# Patient Record
Sex: Male | Born: 1950 | Race: White | Hispanic: No | Smoking: Never smoker
Health system: Southern US, Community
[De-identification: ages and names within clinical notes are randomized; demographics above are authoritative.]

## PROBLEM LIST (undated history)

## (undated) DIAGNOSIS — F32A Depression, unspecified: Secondary | ICD-10-CM

## (undated) DIAGNOSIS — F329 Major depressive disorder, single episode, unspecified: Secondary | ICD-10-CM

## (undated) DIAGNOSIS — K5790 Diverticulosis of intestine, part unspecified, without perforation or abscess without bleeding: Secondary | ICD-10-CM

## (undated) DIAGNOSIS — IMO0002 Reserved for concepts with insufficient information to code with codable children: Secondary | ICD-10-CM

## (undated) DIAGNOSIS — I493 Ventricular premature depolarization: Secondary | ICD-10-CM

## (undated) DIAGNOSIS — Z9289 Personal history of other medical treatment: Secondary | ICD-10-CM

## (undated) DIAGNOSIS — S2249XA Multiple fractures of ribs, unspecified side, initial encounter for closed fracture: Secondary | ICD-10-CM

## (undated) DIAGNOSIS — K219 Gastro-esophageal reflux disease without esophagitis: Secondary | ICD-10-CM

## (undated) HISTORY — DX: Diverticulosis of intestine, part unspecified, without perforation or abscess without bleeding: K57.90

## (undated) HISTORY — DX: Ventricular premature depolarization: I49.3

## (undated) HISTORY — PX: WISDOM TOOTH EXTRACTION: SHX21

## (undated) HISTORY — DX: Multiple fractures of ribs, unspecified side, initial encounter for closed fracture: S22.49XA

## (undated) HISTORY — DX: Major depressive disorder, single episode, unspecified: F32.9

## (undated) HISTORY — DX: Depression, unspecified: F32.A

## (undated) HISTORY — PX: DUPUYTREN CONTRACTURE RELEASE: SHX1478

## (undated) HISTORY — PX: TONSILLECTOMY: SUR1361

## (undated) HISTORY — DX: Reserved for concepts with insufficient information to code with codable children: IMO0002

## (undated) HISTORY — DX: Personal history of other medical treatment: Z92.89

## (undated) HISTORY — DX: Gastro-esophageal reflux disease without esophagitis: K21.9

---

## 2003-06-26 ENCOUNTER — Encounter (INDEPENDENT_AMBULATORY_CARE_PROVIDER_SITE_OTHER): Payer: Self-pay | Admitting: Specialist

## 2003-06-26 ENCOUNTER — Ambulatory Visit (HOSPITAL_BASED_OUTPATIENT_CLINIC_OR_DEPARTMENT_OTHER): Admission: RE | Admit: 2003-06-26 | Discharge: 2003-06-26 | Payer: Self-pay

## 2004-09-30 ENCOUNTER — Encounter: Admission: RE | Admit: 2004-09-30 | Discharge: 2004-09-30 | Payer: Self-pay | Admitting: Family Medicine

## 2005-05-08 ENCOUNTER — Ambulatory Visit: Payer: Self-pay | Admitting: Family Medicine

## 2005-06-15 ENCOUNTER — Ambulatory Visit: Payer: Self-pay | Admitting: Family Medicine

## 2005-07-01 ENCOUNTER — Ambulatory Visit: Payer: Self-pay | Admitting: Family Medicine

## 2006-08-10 ENCOUNTER — Ambulatory Visit: Payer: Self-pay | Admitting: Professional

## 2006-09-02 ENCOUNTER — Ambulatory Visit: Payer: Self-pay | Admitting: Psychology

## 2006-09-08 ENCOUNTER — Ambulatory Visit: Payer: Self-pay | Admitting: Family Medicine

## 2006-09-09 ENCOUNTER — Ambulatory Visit: Payer: Self-pay | Admitting: Psychology

## 2006-10-13 ENCOUNTER — Ambulatory Visit: Payer: Self-pay | Admitting: Family Medicine

## 2006-11-29 ENCOUNTER — Ambulatory Visit: Payer: Self-pay | Admitting: Psychology

## 2006-12-23 ENCOUNTER — Ambulatory Visit: Payer: Self-pay | Admitting: Psychology

## 2007-01-10 ENCOUNTER — Ambulatory Visit: Payer: Self-pay | Admitting: Psychology

## 2007-01-18 ENCOUNTER — Ambulatory Visit: Payer: Self-pay | Admitting: Family Medicine

## 2007-01-18 LAB — CONVERTED CEMR LAB
ALT: 23 units/L (ref 0–40)
AST: 25 units/L (ref 0–37)
Albumin: 3.9 g/dL (ref 3.5–5.2)
Alkaline Phosphatase: 41 units/L (ref 39–117)
BUN: 11 mg/dL (ref 6–23)
Bilirubin, Direct: 0.2 mg/dL (ref 0.0–0.3)
CO2: 29 meq/L (ref 19–32)
Calcium: 9.3 mg/dL (ref 8.4–10.5)
Chloride: 104 meq/L (ref 96–112)
Creatinine, Ser: 0.9 mg/dL (ref 0.4–1.5)
GFR calc Af Amer: 112 mL/min
GFR calc non Af Amer: 92 mL/min
Glucose, Bld: 94 mg/dL (ref 70–99)
Phosphorus: 2.8 mg/dL (ref 2.3–4.6)
Potassium: 3.9 meq/L (ref 3.5–5.1)
Sodium: 140 meq/L (ref 135–145)
Total Bilirubin: 1.1 mg/dL (ref 0.3–1.2)
Total Protein: 6.1 g/dL (ref 6.0–8.3)

## 2007-01-24 ENCOUNTER — Ambulatory Visit: Payer: Self-pay | Admitting: Psychology

## 2007-01-28 ENCOUNTER — Ambulatory Visit: Payer: Self-pay | Admitting: Gastroenterology

## 2007-02-04 ENCOUNTER — Ambulatory Visit: Payer: Self-pay | Admitting: Psychology

## 2007-02-11 ENCOUNTER — Ambulatory Visit: Payer: Self-pay | Admitting: Psychology

## 2007-02-21 ENCOUNTER — Ambulatory Visit: Payer: Self-pay | Admitting: Psychology

## 2007-02-23 ENCOUNTER — Ambulatory Visit: Payer: Self-pay | Admitting: Psychology

## 2007-03-11 ENCOUNTER — Ambulatory Visit: Payer: Self-pay | Admitting: Psychology

## 2007-03-25 ENCOUNTER — Ambulatory Visit: Payer: Self-pay | Admitting: Psychology

## 2007-04-15 ENCOUNTER — Ambulatory Visit: Payer: Self-pay | Admitting: Psychology

## 2007-04-18 ENCOUNTER — Ambulatory Visit: Payer: Self-pay | Admitting: Psychology

## 2007-04-21 ENCOUNTER — Ambulatory Visit: Payer: Self-pay | Admitting: Psychology

## 2007-05-02 ENCOUNTER — Ambulatory Visit: Payer: Self-pay | Admitting: Psychology

## 2007-05-16 ENCOUNTER — Ambulatory Visit: Payer: Self-pay | Admitting: Psychology

## 2007-05-30 ENCOUNTER — Ambulatory Visit: Payer: Self-pay | Admitting: Psychology

## 2007-06-13 ENCOUNTER — Ambulatory Visit: Payer: Self-pay | Admitting: Psychology

## 2007-09-05 ENCOUNTER — Telehealth: Payer: Self-pay | Admitting: Family Medicine

## 2007-09-05 ENCOUNTER — Telehealth (INDEPENDENT_AMBULATORY_CARE_PROVIDER_SITE_OTHER): Payer: Self-pay | Admitting: *Deleted

## 2008-04-13 ENCOUNTER — Ambulatory Visit: Payer: Self-pay | Admitting: Psychology

## 2008-11-14 ENCOUNTER — Telehealth: Payer: Self-pay | Admitting: Family Medicine

## 2008-11-22 ENCOUNTER — Ambulatory Visit: Payer: Self-pay | Admitting: Psychology

## 2008-11-29 ENCOUNTER — Ambulatory Visit: Payer: Self-pay | Admitting: Psychology

## 2008-12-07 ENCOUNTER — Ambulatory Visit: Payer: Self-pay | Admitting: Gastroenterology

## 2009-03-19 ENCOUNTER — Ambulatory Visit: Payer: Self-pay | Admitting: Family Medicine

## 2009-03-20 LAB — CONVERTED CEMR LAB
ALT: 18 units/L (ref 0–53)
AST: 23 units/L (ref 0–37)
Albumin: 4.3 g/dL (ref 3.5–5.2)
Alkaline Phosphatase: 43 units/L (ref 39–117)
BUN: 11 mg/dL (ref 6–23)
Basophils Absolute: 0 10*3/uL (ref 0.0–0.1)
Basophils Relative: 0.3 % (ref 0.0–3.0)
Bilirubin, Direct: 0.2 mg/dL (ref 0.0–0.3)
CO2: 31 meq/L (ref 19–32)
Calcium: 9.3 mg/dL (ref 8.4–10.5)
Chloride: 110 meq/L (ref 96–112)
Cholesterol: 177 mg/dL (ref 0–200)
Creatinine, Ser: 0.9 mg/dL (ref 0.4–1.5)
Eosinophils Absolute: 0.1 10*3/uL (ref 0.0–0.7)
Eosinophils Relative: 1.1 % (ref 0.0–5.0)
GFR calc non Af Amer: 91.63 mL/min (ref >60)
Glucose, Bld: 89 mg/dL (ref 70–99)
HCT: 46.1 % (ref 39.0–52.0)
HDL: 49 mg/dL (ref >39.00)
Hemoglobin: 16.5 g/dL (ref 13.0–17.0)
LDL Cholesterol: 112 mg/dL — ABNORMAL HIGH (ref 0–99)
Lymphocytes Relative: 23.5 % (ref 12.0–46.0)
Lymphs Abs: 1.5 10*3/uL (ref 0.7–4.0)
MCHC: 35.7 g/dL (ref 30.0–36.0)
MCV: 94.2 fL (ref 78.0–100.0)
Monocytes Absolute: 0.6 10*3/uL (ref 0.1–1.0)
Monocytes Relative: 9.2 % (ref 3.0–12.0)
Neutro Abs: 4 10*3/uL (ref 1.4–7.7)
Neutrophils Relative %: 65.9 % (ref 43.0–77.0)
PSA: 1.76 ng/mL (ref 0.10–4.00)
Platelets: 135 10*3/uL — ABNORMAL LOW (ref 150.0–400.0)
Potassium: 4.2 meq/L (ref 3.5–5.1)
RBC: 4.9 M/uL (ref 4.22–5.81)
RDW: 12.1 % (ref 11.5–14.6)
Sodium: 142 meq/L (ref 135–145)
TSH: 2.84 microintl units/mL (ref 0.35–5.50)
Total Bilirubin: 0.9 mg/dL (ref 0.3–1.2)
Total CHOL/HDL Ratio: 4
Total Protein: 6.9 g/dL (ref 6.0–8.3)
Triglycerides: 82 mg/dL (ref 0.0–149.0)
VLDL: 16.4 mg/dL (ref 0.0–40.0)
WBC: 6.2 10*3/uL (ref 4.5–10.5)

## 2009-07-18 ENCOUNTER — Ambulatory Visit: Payer: Self-pay | Admitting: Family Medicine

## 2009-07-18 DIAGNOSIS — D696 Thrombocytopenia, unspecified: Secondary | ICD-10-CM | POA: Insufficient documentation

## 2009-07-18 LAB — CONVERTED CEMR LAB
Basophils Absolute: 0 10*3/uL (ref 0.0–0.1)
Basophils Relative: 0.1 % (ref 0.0–3.0)
Eosinophils Absolute: 0.1 10*3/uL (ref 0.0–0.7)
Eosinophils Relative: 1.3 % (ref 0.0–5.0)
HCT: 45.8 % (ref 39.0–52.0)
Hemoglobin: 15.4 g/dL (ref 13.0–17.0)
Lymphocytes Relative: 23.7 % (ref 12.0–46.0)
Lymphs Abs: 1.4 10*3/uL (ref 0.7–4.0)
MCHC: 33.6 g/dL (ref 30.0–36.0)
MCV: 95.3 fL (ref 78.0–100.0)
Monocytes Absolute: 0.7 10*3/uL (ref 0.1–1.0)
Monocytes Relative: 10.7 % (ref 3.0–12.0)
Neutro Abs: 3.9 10*3/uL (ref 1.4–7.7)
Neutrophils Relative %: 64.2 % (ref 43.0–77.0)
Platelets: 154 10*3/uL (ref 150.0–400.0)
RBC: 4.81 M/uL (ref 4.22–5.81)
RDW: 11.8 % (ref 11.5–14.6)
WBC: 6.1 10*3/uL (ref 4.5–10.5)

## 2009-08-15 ENCOUNTER — Ambulatory Visit: Payer: Self-pay | Admitting: Gastroenterology

## 2010-01-28 ENCOUNTER — Ambulatory Visit: Payer: Self-pay | Admitting: Family Medicine

## 2010-01-28 DIAGNOSIS — J069 Acute upper respiratory infection, unspecified: Secondary | ICD-10-CM | POA: Insufficient documentation

## 2010-02-17 ENCOUNTER — Encounter (INDEPENDENT_AMBULATORY_CARE_PROVIDER_SITE_OTHER): Payer: Self-pay | Admitting: *Deleted

## 2010-03-21 ENCOUNTER — Encounter (INDEPENDENT_AMBULATORY_CARE_PROVIDER_SITE_OTHER): Payer: Self-pay | Admitting: *Deleted

## 2010-03-28 ENCOUNTER — Ambulatory Visit: Payer: Self-pay | Admitting: Gastroenterology

## 2010-04-04 ENCOUNTER — Ambulatory Visit: Payer: Self-pay | Admitting: Gastroenterology

## 2010-04-04 LAB — HM COLONOSCOPY

## 2010-04-13 ENCOUNTER — Encounter: Payer: Self-pay | Admitting: Gastroenterology

## 2010-11-25 NOTE — Progress Notes (Signed)
Summary: wants referral for colonoscopy  Phone Note Call from Patient Call back at [phone]   Caller: Patient Call For: dr tower Summary of Call: Pt wants referral for colonoscopy but wants to discuss his schedule first with [facility] before she schedules.  Pt has an appt for a physical in march, but he needs to get the colonoscopy as soon as he can because he might have an insurance change in april. Initial call taken by: Lowella Petties,  November 14, 2008 3:48 PM  Follow-up for Phone Call        I will do ref for screening colonosc and route to [facility] Follow-up by: Judith Part MD,  November 14, 2008 5:42 PM  Additional Follow-up for Phone Call Additional follow up Details #1::        APPT MADE WITH DR Russella Dar ON 12/21/2008 AT 7:30. Carlton Adam  November 15, 2008 9:13 AM  Additional Follow-up by: Carlton Adam,  November 15, 2008 9:13 AM  New Problems: SCREENING FOR MALIGNANT NEOPLASM OF THE RECTUM (ICD-V76.41)   New Problems: SCREENING FOR MALIGNANT NEOPLASM OF THE RECTUM (ICD-V76.41)

## 2010-11-25 NOTE — Assessment & Plan Note (Signed)
Summary: CPX/CLE   Vital Signs:  Patient profile:   60 year old male Height:      71.5 inches Weight:      166 pounds Temp:     97.5 degrees F oral Pulse rate:   68 / minute Pulse rhythm:   regular BP sitting:   118 / 80  (left arm) Cuff size:   regular  Vitals Entered By: Sydell Axon (Mar 19, 2009 8:18 AM)  History of Present Illness: here for health mt exam  is working a lot and feeling fine   less stress/ financially but not out of the woods yet - things are looking up  has been off zoloft and away from counseling   is taking good care of himself- watches what he eats  stretches and light calesthenics for exercise walks a lot of stairs and has a very active job -- is fairly fit wt is down 173 to 166 ? from coming off zoloft- is not trying to loose- is eating better- avoiding fast foods in general   fam hx of colon polyps -last colonosc 01 had to postpone it -- will call himself no stool changes at all   no new fam hx- mother may have heart rel problem-- poss a fib   last Td 12/00- needs to  update      Allergies: No Known Drug Allergies  Past History:  Family History:    father colon polyps    mother arthritis, heart trouble (? atrial fib)    MGM CAD    MGF CAD    PGM liver cancer     sister depression    great aunts with ? colon cancer  (03/19/2009)  Social History:    non smoker     some  exercise     light alcohol  (03/19/2009)  Past Medical History:    depression   Past Surgical History:    flex sig 2006    tonsillectomy    surg for dupytens    knee- meniscal tear   Family History:    father colon polyps    mother arthritis, heart trouble (? atrial fib)    MGM CAD    MGF CAD    PGM liver cancer     sister depression    great aunts with ? colon cancer   Social History:    non smoker     some  exercise     light alcohol   Review of Systems General:  Denies fatigue, fever, loss of appetite, and malaise. Eyes:  Denies blurring and  eye pain. CV:  Denies chest pain or discomfort, palpitations, shortness of breath with exertion, and swelling of feet. Resp:  Denies cough and wheezing. GI:  Denies change in bowel habits, nausea, and vomiting. GU:  Denies dysuria, nocturia, urinary frequency, and urinary hesitancy. MS:  Denies joint pain. Derm:  Denies itching, lesion(s), and rash. Neuro:  Denies numbness, tingling, and weakness. Psych:  Denies anxiety, depression, and panic attacks. Endo:  Denies excessive thirst and excessive urination. Heme:  Denies abnormal bruising and bleeding.  Physical Exam  General:  Well-developed,well-nourished,in no acute distress; alert,appropriate and cooperative throughout examination Head:  normocephalic, atraumatic, and no abnormalities observed.   Eyes:  vision grossly intact, pupils equal, pupils round, and pupils reactive to light.   Ears:  R ear normal and L ear normal.   Nose:  no nasal discharge.   Mouth:  pharynx pink and moist.   Neck:  supple with full rom and no masses or thyromegally, no JVD or carotid bruit  Chest Wall:  No deformities, masses, tenderness or gynecomastia noted. Lungs:  Normal respiratory effort, chest expands symmetrically. Lungs are clear to auscultation, no crackles or wheezes. Heart:  Normal rate and regular rhythm. S1 and S2 normal without gallop, murmur, click, rub or other extra sounds. Abdomen:  Bowel sounds positive,abdomen soft and non-tender without masses, organomegaly or hernias noted. no renal bruits  Rectal:  No external abnormalities noted. Normal sphincter tone. No rectal masses or tenderness. Prostate:  Prostate gland firm and smooth, no enlargement, nodularity, tenderness, mass, asymmetry or induration. Msk:  No deformity or scoliosis noted of thoracic or lumbar spine.  no acute joint changes, full rom of all joints  Pulses:  R and L carotid,radial,femoral,dorsalis pedis and posterior tibial pulses are full and equal  bilaterally Extremities:  No clubbing, cyanosis, edema, or deformity noted with normal full range of motion of all joints.   Neurologic:  sensation intact to light touch, gait normal, and DTRs symmetrical and normal.   Skin:  Intact without suspicious lesions or rashes lentigos diffusely fair complexion without pallor Cervical Nodes:  No lymphadenopathy noted Inguinal Nodes:  No significant adenopathy Psych:  normal affect, talkative and pleasant    Impression & Recommendations:  Problem # 1:  HEALTH MAINTENANCE EXAM (ICD-V70.0) Assessment Comment Only reviewed health habits including diet, exercise and skin cancer prevention reviewed health maintenance list and family history  wellness labs today Td today Orders: Venipuncture (13086) TLB-Lipid Panel (80061-LIPID) TLB-BMP (Basic Metabolic Panel-BMET) (80048-METABOL) TLB-CBC Platelet - w/Differential (85025-CBCD) TLB-Hepatic/Liver Function Pnl (80076-HEPATIC) TLB-TSH (Thyroid Stimulating Hormone) (84443-TSH)  Problem # 2:  SPECIAL SCREENING MALIGNANT NEOPLASM OF PROSTATE (ICD-V76.44) Assessment: Comment Only psa today no change on prostate exam /or symptoms Orders: TLB-PSA (Prostate Specific Antigen) (84153-PSA)  Problem # 3:  SCREENING FOR MALIGNANT NEOPLASM OF THE RECTUM (ICD-V76.41) Assessment: Comment Only pt will schedule own colonosc- due for 5 year re check   Complete Medication List: 1)  Multivitamins Tabs (Multiple vitamin) .... Take one by mouth daily 2)  Aspirin 81 Mg Tbec (Aspirin) .... Take one by mouth daily  Patient Instructions: 1)  you are due for colonoscopy- do not forget to call and schedule it  2)  keep up the good work with diet and exercise  3)  wellness labs today 4)  tetnus shot today    Current Allergies (reviewed today): No known allergies   Preventive Care Screening  Colonoscopy:    Date:  01/21/2000    Next Due:  01/2005    Results:  Diverticulosis   Last Tetanus Booster:     Date:  09/26/1999    Results:  Td    Appended Document: CPX/CLE   Tetanus/Td Vaccine    Vaccine Type: Td    Site: left deltoid    Mfr: Sanofi Pasteur    Dose: 0.5 ml    Route: IM    Given by: Sydell Axon    Exp. Date: 12/04/2010    Lot #: [id number]    VIS given: 09/13/07 version given Mar 19, 2009.

## 2010-11-25 NOTE — Miscellaneous (Signed)
Summary: [facility] Previsit/prep  Clinical Lists Changes  Medications: Added new medication of MOVIPREP 100 GM  SOLR (PEG-KCL-NACL-NASULF-NA ASC-C) As per prep instructions. - Signed Rx of MOVIPREP 100 GM  SOLR (PEG-KCL-NACL-NASULF-NA ASC-C) As per prep instructions.;  #1 x 0;  Signed;  Entered by: Wyona Almas RN;  Authorized by: Rachael Fee MD;  Method used: Electronically to [facility] [address] [phone]*, [address] [place], [place]  [address], Ph: [phone], Fax: [phone] Observations: Added new observation of NKA: T (03/28/2010 7:56)    Prescriptions: MOVIPREP 100 GM  SOLR (PEG-KCL-NACL-NASULF-NA ASC-C) As per prep instructions.  #1 x 0   Entered by:   Wyona Almas RN   Authorized by:   Rachael Fee MD   Signed by:   Wyona Almas RN on 03/28/2010   Method used:   Electronically to        [facility] [address] [phone]* (retail)       [address]       [address]       [place], [place]  [address]       Ph: [phone]       Fax: [phone]   RxID:   [id number]

## 2010-11-25 NOTE — Letter (Signed)
Summary: Patient Notice- Polyp Results  [facility]  [address] [place], [place] [address]   Phone: [phone]  Fax: [phone]        April 13, 2010 MRN: [id number]    [facility] [address] [place], [place]  [address]    Dear Mr. VILLAMAR,  I am pleased to inform you that the colon polyp(s) removed during your recent colonoscopy was (were) found to be benign (no cancer detected) upon pathologic examination.  I recommend you have a repeat colonoscopy examination in 5 years to look for recurrent polyps, as having colon polyps increases your risk for having recurrent polyps or even colon cancer in the future.  Should you develop new or worsening symptoms of abdominal pain, bowel habit changes or bleeding from the rectum or bowels, please schedule an evaluation with either your primary care physician or with me.  Continue treatment plan as outlined the day of your exam.  Please call us if you are having persistent problems or have questions about your condition that have not been fully answered at this time.  Sincerely,  Meryl Dare MD [facility]  This letter has been electronically signed by your physician.  Appended Document: Patient Notice- Polyp Results letter mailed.

## 2010-11-25 NOTE — Miscellaneous (Signed)
Summary: GI PV  Clinical Lists Changes  Medications: Added new medication of MOVIPREP 100 GM  SOLR (PEG-KCL-NACL-NASULF-NA ASC-C) As per prep instructions. - Signed Rx of MOVIPREP 100 GM  SOLR (PEG-KCL-NACL-NASULF-NA ASC-C) As per prep instructions.;  #1 x 0;  Signed;  Entered by: Barton Fanny RN;  Authorized by: Meryl Dare MD [facility];  Method used: [facility] [address] [phone]*, [address] [place], [place], [place]  [address], Ph: [phone] or [phone], Fax: [phone] Observations: Added new observation of NKA: T (12/07/2008 16:05)    Prescriptions: MOVIPREP 100 GM  SOLR (PEG-KCL-NACL-NASULF-NA ASC-C) As per prep instructions.  #1 x 0   Entered by:   Barton Fanny RN   Authorized by:   Meryl Dare MD [facility]   Signed by:   Barton Fanny RN on 12/07/2008   Method used:   [facility]ronically to        [facility] [address] [phone]* (retail)       [address]       [place], [place]  [address]       Ph: [phone] or [phone]       Fax: [phone]   RxID:   [id number]

## 2010-11-25 NOTE — Assessment & Plan Note (Signed)
Summary: CONGESTION,COUGH/CLE   Vital Signs:  Patient profile:   60 year old male Height:      71.5 inches Weight:      172.25 pounds BMI:     23.77 Temp:     98.6 degrees F oral Pulse rate:   96 / minute Pulse rhythm:   regular BP sitting:   104 / 60  (left arm) Cuff size:   regular  Vitals Entered By: Delilah Shan CMA Duncan Dull) (January 28, 2010 12:11 PM) CC: Congestion, cough.  (See HPI)   History of Present Illness: Patient was diagnosed with Strep at UC last Wednesday and started Cefdinir 300 mg. two times a day x 10 days last Wednesday night.  He said he felt better after about 48 hours but then started running fever, coughing more, etc., almost as if he was coming down with  something different. Lugene Fuquay CMA (AAMA)  January 28, 2010 12:20 PM    Last night had a temp of 101.5.  Prior to being diagnosed with strep, was complaining of sinus pressure and productive cough. He has not allergy to PCN, per pt was given Cefdinir because he was complaining of sinus problems as well.  No wheezing or SOB but cough is much more productive. He is very fatigued. Has never had anything like this before.  He is also taking Mucinex.  Current Medications (verified): 1)  Multivitamins  Tabs (Multiple Vitamin) .... Take One By Mouth Daily 2)  Avelox 400 Mg  Tabs (Moxifloxacin Hcl) .Marland Kitchen.. 1 Tablet By Mouth Daily X 10 Days 3)  Tessalon Perles 100 Mg  Caps (Benzonatate) .Marland Kitchen.. 1 Tab By Mouth Three Times A Day As Needed Cough  Allergies (verified): No Known Drug Allergies  Review of Systems      See HPI General:  Complains of chills, fatigue, and fever. ENT:  Complains of sinus pressure and sore throat. Resp:  Complains of cough; denies sputum productive and wheezing. GI:  Denies abdominal pain.  Physical Exam  General:  Well-developed,well-nourished,in no acute distress; alert,appropriate and cooperative throughout examination Afebrile, non toxic Ears:  TMs retracted bilaterally. Nose:   nasal dischargemucosal pallor.   TTP throughout Mouth:  pharyngeal erythema.   tonsilar edema, no exdudates Lungs:  Normal respiratory effort, chest expands symmetrically. Lungs are clear to auscultation, no crackles or wheezes. Heart:  Normal rate and regular rhythm. S1 and S2 normal without gallop, murmur, click, rub or other extra sounds. Extremities:  No clubbing, cyanosis, edema, or deformity noted with normal full range of motion of all joints.   Psych:  normal affect, talkative and pleasant    Impression & Recommendations:  Problem # 1:  URI (ICD-465.9) Assessment Deteriorated Rather complicated given that he has almost completed 10 day course of Cedinir for strep and still spiking temps with worsening signs of sinusitis.   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  Explained why Cefdinir may not have covered the bugs in the ENT area.  Pt agreeable but is worried about taking more abx.  Discussed risks and benefits.  He will start to eat more yogurt with live cultures.   Given signs to watch for if developping C. Diff.  The following medications were removed from the medication list:    Aspirin 81 Mg Tbec (Aspirin) .Marland Kitchen... Take one by mouth daily His updated medication list for this problem includes:    Tessalon Perles 100 Mg Caps (Benzonatate) .Marland Kitchen... 1 tab by mouth three times a day as needed  cough  Complete Medication List: 1)  Multivitamins Tabs (Multiple vitamin) .... Take one by mouth daily 2)  Avelox 400 Mg Tabs (Moxifloxacin hcl) .Marland Kitchen.. 1 tablet by mouth daily x 10 days 3)  Tessalon Perles 100 Mg Caps (Benzonatate) .Marland Kitchen.. 1 tab by mouth three times a day as needed cough Prescriptions: TESSALON PERLES 100 MG  CAPS (BENZONATATE) 1 tab by mouth three times a day as needed cough  #20 x 0   Entered and Authorized by:   Ruthe Mannan MD   Signed by:   Ruthe Mannan MD on 01/28/2010   Method used:   Electronically to        [facility] [address] [phone]* (retail)       [address]       [address]       [place], [place]  [address]       Ph: [phone]       Fax: [phone]   RxID:   [id number] AVELOX 400 MG  TABS (MOXIFLOXACIN HCL) 1 tablet by mouth daily x 10 days  #10 x 0   Entered and Authorized by:   Ruthe Mannan MD   Signed by:   Ruthe Mannan MD on 01/28/2010   Method used:   Electronically to        [facility] [address] [phone]* (retail)       [address]       [address]       [place], [place]  [address]       Ph: [phone]       Fax: [phone]   RxID:   [id number]   Current Allergies (reviewed today): No known allergies

## 2010-11-25 NOTE — Miscellaneous (Signed)
Summary: [facility] previsit-prep  Clinical Lists Changes  Medications: Added new medication of MOVIPREP 100 GM  SOLR (PEG-KCL-NACL-NASULF-NA ASC-C) As per prep instructions. - Signed Rx of MOVIPREP 100 GM  SOLR (PEG-KCL-NACL-NASULF-NA ASC-C) As per prep instructions.;  #1 x 0;  Signed;  Entered by: Doristine Church RN II;  Authorized by: Meryl Dare MD [facility];  Method used: [facility] [address] [phone]*, [address] [place], [place], [place]  [address], Ph: [phone] or [phone], Fax: [phone] Observations: Added new observation of ALLERGY REV: Done (08/15/2009 8:00) Added new observation of NKA: T (08/15/2009 8:00)    Prescriptions: MOVIPREP 100 GM  SOLR (PEG-KCL-NACL-NASULF-NA ASC-C) As per prep instructions.  #1 x 0   Entered by:   Doristine Church RN II   Authorized by:   Meryl Dare MD [facility]   Signed by:   Doristine Church RN II on 08/15/2009   Method used:   Electronically to        [facility] [address] [phone]* (retail)       [address]       [place], [place]  [address]       Ph: [phone] or [phone]       Fax: [phone]   RxID:   [id number]

## 2010-11-25 NOTE — Progress Notes (Signed)
Summary: zoloft  Phone Note Other Incoming   [facility] Summary of Call: electronic refill request for gen zoloft Initial call taken by: Lowella Petties,  September 05, 2007 9:06 AM  Follow-up for Phone Call        px written on EMR for call in  Follow-up by: Judith Part MD,  September 05, 2007 1:53 PM  Additional Follow-up for Phone Call Additional follow up Details #1::        [facility] Additional Follow-up by: Lowella Petties,  September 05, 2007 2:11 PM      Prescriptions: ZOLOFT 50 MG TABS (SERTRALINE HCL) Take 1 tablet by mouth once a day  #30 x 5   Entered and Authorized by:   Judith Part MD   Signed by:   Judith Part MD on 09/05/2007   Method used:   Telephoned to ...         RxID:   [id number]     Appended Document: zoloft med [facility]

## 2010-11-25 NOTE — Letter (Signed)
Summary: [facility] Instructions  [facility]  [address] [place], [place] [address]   Phone: [phone]  Fax: [phone]       Jacob Marsh    12/05/1950    MRN: [id number]        Procedure Day Dorna Bloom:  Farrell Ours  04/04/10     Arrival Time:  9:00am     Procedure Time:  10:00am     Location of Procedure:                    Juliann Pares _  [facility] (4th Floor)                        PREPARATION FOR COLONOSCOPY WITH MOVIPREP   Starting 5 days prior to your procedure  SUNDAY 03/30/10  do not eat nuts, seeds, popcorn, corn, beans, peas,  salads, or any raw vegetables.  Do not take any fiber supplements (e.g. Metamucil, Citrucel, and Benefiber).  THE DAY BEFORE YOUR PROCEDURE         DATE:  THURSDAY   04/03/10  1.  Drink clear liquids the entire day-NO SOLID FOOD  2.  Do not drink anything colored red or purple.  Avoid juices with pulp.  No orange juice.  3.  Drink at least 64 oz. (8 glasses) of fluid/clear liquids during the day to prevent dehydration and help the prep work efficiently.  CLEAR LIQUIDS INCLUDE: Water Jello Ice Popsicles Tea (sugar ok, no milk/cream) Powdered fruit flavored drinks Coffee (sugar ok, no milk/cream) Gatorade Juice: apple, white grape, white cranberry  Lemonade Clear bullion, consomm, broth Carbonated beverages (any kind) Strained chicken noodle soup Hard Candy                             4.  In the morning, mix first dose of MoviPrep solution:    Empty 1 Pouch A and 1 Pouch B into the disposable container    Add lukewarm drinking water to the top line of the container. Mix to dissolve    Refrigerate (mixed solution should be used within 24 hrs)  5.  Begin drinking the prep at 5:00 p.m. The MoviPrep container is divided by 4 marks.   Every 15 minutes drink the solution down to the next mark (approximately 8 oz) until the full liter is complete.   6.  Follow completed prep with 16 oz of clear liquid of your  choice (Nothing red or purple).  Continue to drink clear liquids until bedtime.  7.  Before going to bed, mix second dose of MoviPrep solution:    Empty 1 Pouch A and 1 Pouch B into the disposable container    Add lukewarm drinking water to the top line of the container. Mix to dissolve    Refrigerate  THE DAY OF YOUR PROCEDURE      DATE:  FRIDAY  04/04/10  Beginning at  5:00 a.m. (5 hours before procedure):         1. Every 15 minutes, drink the solution down to the next mark (approx 8 oz) until the full liter is complete.  2. Follow completed prep with 16 oz. of clear liquid of your choice.    3. You may drink clear liquids until  8:00am  (2 HOURS BEFORE PROCEDURE).   MEDICATION INSTRUCTIONS  Unless otherwise instructed, you should take regular prescription medications with a small sip of water  as early as possible the morning of your procedure.         OTHER INSTRUCTIONS  You will need a responsible adult at least 62 years of age to accompany you and drive you home.   This person must remain in the waiting room during your procedure.  Wear loose fitting clothing that is easily removed.  Leave jewelry and other valuables at home.  However, you may wish to bring a book to read or  an iPod/MP3 player to listen to music as you wait for your procedure to start.  Remove all body piercing jewelry and leave at home.  Total time from sign-in until discharge is approximately 2-3 hours.  You should go home directly after your procedure and rest.  You can resume normal activities the  day after your procedure.  The day of your procedure you should not:   Drive   Make legal decisions   Operate machinery   Drink alcohol   Return to work  You will receive specific instructions about eating, activities and medications before you leave.    The above instructions have been reviewed and explained to me by  Wyona Almas RN  March 28, 2010 8:23 AM     I fully  understand and can verbalize these instructions _____________________________ Date _________

## 2010-11-25 NOTE — Letter (Signed)
Summary: Previsit letter  [facility]  [address] [place], [place] [address]   Phone: [phone]  Fax: [phone]       02/17/2010 MRN: [id number]  [facility] [address] [place], [place]  [address]  Dear Jacob Marsh,  Welcome to the Gastroenterology Division at [facility].    You are scheduled to see a nurse for your pre-procedure visit on 03/29/2007 at 8:00am on the 3rd floor at [facility], [address]  We ask that you try to arrive at our office 15 minutes prior to your appointment time to allow for check-in.  Your nurse visit will consist of discussing your medical and surgical history, your immediate family medical history, and your medications.    Please bring a complete list of all your medications or, if you prefer, bring the medication bottles and we will list them.  We will need to be aware of both prescribed and over the counter drugs.  We will need to know exact dosage information as well.  If you are on blood thinners (Coumadin, Plavix, Aggrenox, Ticlid, etc.) please call our office today/prior to your appointment, as we need to consult with your physician about holding your medication.   Please be prepared to read and sign documents such as consent forms, a financial agreement, and acknowledgement forms.  If necessary, and with your consent, a friend or relative is welcome to sit-in on the nurse visit with you.  Please bring your insurance card so that we may make a copy of it.  If your insurance requires a referral to see a specialist, please bring your referral form from your primary care physician.  No co-pay is required for this nurse visit.     If you cannot keep your appointment, please call [phone] to cancel or reschedule prior to your appointment date.  This allows [place] the opportunity to schedule an appointment for another patient in need of care.    Thank you for choosing [facility] for your  medical needs.  We appreciate the opportunity to care for you.  Please visit [place] at our website  to learn more about our practice.                     Sincerely.                                                                                                                   The Gastroenterology Division

## 2010-11-25 NOTE — Progress Notes (Signed)
Summary: med update  Medications Added ZOLOFT 50 MG TABS (SERTRALINE HCL) Take 1 tablet by mouth once a day            New/Updated Medications: ZOLOFT 50 MG TABS (SERTRALINE HCL) Take 1 tablet by mouth once a day

## 2010-11-25 NOTE — Procedures (Signed)
Summary: Colonoscopy  Patient: Jacob Marsh Note: All result statuses are Final unless otherwise noted.  Tests: (1) Colonoscopy (COL)   COL Colonoscopy           DONE     [facility]     [address]     [place], [place]  [address]           COLONOSCOPY PROCEDURE REPORT           PATIENT:  Jacob Marsh, Jacob Marsh  MR#:  [id number]     BIRTHDATE:  [birth year], 58 yrs. old  GENDER:  male     ENDOSCOPIST:  Judie Petit T. Russella Dar, MD, [facility]           PROCEDURE DATE:  04/04/2010     PROCEDURE:  Colonoscopy with snare polypectomy     ASA CLASS:  Class II     INDICATIONS:  Routine Risk Screening     MEDICATIONS:   Fentanyl 50 mcg IV, Versed 5 mg IV     DESCRIPTION OF PROCEDURE:   After the risks benefits and     alternatives of the procedure were thoroughly explained, informed     consent was obtained.  Digital rectal exam was performed and     revealed no abnormalities.   The LB PCF-H180AL [id number] endoscope     was introduced through the anus and advanced to the cecum, which     was identified by both the appendix and ileocecal valve, without     limitations.  The quality of the prep was good, using MoviPrep.     The instrument was then slowly withdrawn as the colon was fully     examined.     <<PROCEDUREIMAGES>>     FINDINGS:  Scattered diverticula were found in the transverse     colon. Moderate diverticulosis was found in the sigmoid to     descending colon.  A sessile polyp was found in the rectum. It was     5 mm in size. Polyp was snared without cautery.  Retrieval was     successful.  A normal appearing cecum, ileocecal valve, and     appendiceal orifice were identified. The ascending, hepatic     flexure, splenic flexure appeared unremarkable.  Retroflexed views     in the rectum revealed no abnormalities.  The time to cecum =     2.67  minutes. The scope was then withdrawn (time =  11  min) from     the patient and the procedure completed.           COMPLICATIONS:  None       ENDOSCOPIC IMPRESSION:     1) Diverticula, scattered in the transverse colon     2) Moderate diverticulosis in the sigmoid to descending colon     3) 5 mm sessile polyp in the rectum           RECOMMENDATIONS:     1) Await pathology results     2) High fiber diet with liberal fluid intake.     3) If the polyp removed is adenomatous (pre-cancerous), you will     need a repeat colonoscopy in 5 years. Otherwise you should     continue to follow colorectal cancer screening guidelines for     "routine risk" patients with colonoscopy in 10 years.           Venita Lick. Russella Dar, MD, [facility]           CC:  Judy Pimple, MD           n.     Rosalie DoctorJudie Petit T. Nashea Chumney at 04/04/2010 10:32 AM           Randall An, [phone]  Note: An exclamation mark (!) indicates a result that was not dispersed into the flowsheet. Document Creation Date: 04/04/2010 10:33 AM _______________________________________________________________________  (1) Order result status: Final Collection or observation date-time: 04/04/2010 10:27 Requested date-time:  Receipt date-time:  Reported date-time:  Referring Physician:   Ordering Physician: Claudette Head [phone]) Specimen Source:  Source: Launa Grill Order Number: [id number] Lab site:   Appended Document: Colonoscopy     Procedures Next Due Date:    Colonoscopy: 03/2015

## 2011-03-10 NOTE — Assessment & Plan Note (Signed)
[facility]                                 ON-CALL NOTE   NAME:[name], [name]               MRN:          [id number]  DATE:04/28/2007                            DOB:          04-Mar-1951    TELEPHONE NUMBER:  [phone]   TIME OF CALL:  6:38 p.m.   Mr. [name] called this morning regarding his son [name].  Tells me that  his son has been having problems with abdominal pain for at least one  month.  He has had workup in [place] and again here in [place]  with Dr. Elnoria Howard.  I am told that workup has included blood work,  urinalysis, and a CT scan.  No cause for pain has been found.  Late this  afternoon he underwent colonoscopy and upper endoscopy with Dr. Elnoria Howard.  I  am told that the colonoscopy was entirely normal, the upper endoscopy  revealed mild gastritis.  He was given a proton pump inhibitor,  Carafate, and Percocet for pain.  This evening the patient's father  reports that his son is having abdominal cramping which is severe.  He  did take pain medicine earlier which did not help.  Took his GI  medications and vomited.  No other symptoms or problems.  I told his  father that it was likely that his symptoms this evening were due to  excessive retained air from his double procedure a few hours ago. I  recommended that he move around in an effort to pass flatus, as well  drink warm liquids.  However, if at any point he felt that the problem  was more significant, then they should proceed to the emergency room for  further evaluation. I also told them that if he has any additional  questions or anything I can help with, to please give me a call.  He was  appreciative.     Wilhemina Bonito. Marina Goodell, MD  Electronically Signed    JNP/MedQ  DD: 04/28/2007  DT: 04/29/2007  Job #: [id number]   cc:   Jordan Hawks. Elnoria Howard, MD

## 2011-03-13 NOTE — Op Note (Signed)
Jacob Marsh                          ACCOUNT NO.:  [id number]   MEDICAL RECORD NO.:  [id number]                   PATIENT TYPE:  AMB   LOCATION:  DSC                                  FACILITY:  [facility]   PHYSICIAN:  Katy Fitch. Naaman Plummer., M.D.          DATE OF BIRTH:  [birth year]   DATE OF PROCEDURE:  06/26/2003  DATE OF DISCHARGE:                                 OPERATIVE REPORT   PREOPERATIVE DIAGNOSIS:  Complex Dupuytren's contracture affecting  pretendinous fibers to long, ring and small fingers, right hand involving  ring and small fingers with extensive development of lateral fascial  sheaths, natatory ligaments with spiral bands and retrovascular cords and a  central cord involving the small finger creating a significant proximal  interphalangeal flexion contracture.   POSTOPERATIVE DIAGNOSIS:  Complex Dupuytren's contracture affecting  pretendinous fibers to long, ring and small fingers, right hand involving  ring and small fingers with extensive development of lateral fascial  sheaths, natatory ligaments with spiral bands and retrovascular cords and a  central cord involving the small finger creating a significant proximal  interphalangeal flexion contracture.   OPERATION PERFORMED:  Excision of complex Dupuytren's contracture, right  hand involving small finger, ring finger including the palmar fascia to the  ring and small fingers and removal of the long finger pretendinous fibers in  the palm.   SURGEON:  Katy Fitch. Sypher, M.D.   ASSISTANT:  Jonni Sanger, P.A.   ANESTHESIA:  General by LMA.   SUPERVISING ANESTHESIOLOGIST:  Janetta Hora. Gelene Mink, M.D.   INDICATIONS FOR PROCEDURE:  Jacob Marsh is a 60 year old right-hand  dominant gentleman who has a history of bilateral Dupuytren's contracture.  He had previous left hand surgery and had had a satisfactory result.  He  developed a progressive contracture of his ring and small fingers and palm  on the  right, leading to significant MP and PIP flexion contractures.  Due  to failure to respond to nonoperative measures and given his past experience  with surgery on the left, he elects to proceed with subtotal fasciectomy at  this time.  Preoperatively, it was noted that he had extensive nodular  disease in the region of his proximal phalangeal segments of the long and  ring fingers and will probably have complex retrovascular cords, spiral  bands, central band involvement and lateral fascial sheath involvement,  particularly of the small finger.   Preoperatively, we had a lengthy informed consent in which he was advised  that Dupuytren's disease is not controllable with mechanical treatment and a  biology that is poorly understood.  He understands that there is a likely  risk of recurrence in his right and left hands despite our best efforts at  fasciectomy in the involved fingers.  He understands that he may develop  disease at a later date in his thumb, index webspace, index and/or long  finger.  We further discussed the occasional  need to perform full thickness  skin grafting to control dermal disease.  After informed consent, he is  brought to the operating room at this time.   DESCRIPTION OF PROCEDURE:  Jacob Marsh was brought to the operating room  and placed in supine position on the operating table.  Following inductin of  general anesthesia by LMA, the right arm was prepped with Betadine soap and  solution and sterilely draped.  Following exsanguination of the limb with an  Esmarch bandage, an arterial tourniquet was inflated to .  One gram  of Ancef was administered as an IV prophylactic antibiotic.   The procedure commenced with planning of Brunner zigzag incisions creating a  web-based flap between the ring and small fingers.  An incision paralleling  the thenar crease was accomplished in the palm to allow exposure of the  pretendinous fibers to the long, ring and  small fingers.  The skin flaps  were meticulously elevated at the fascia dermal level.  Areas of pitting  were carefully dissected with removal of the pathologic fascia from the  dermis.  The skin flaps were carefully elevated exposing the palmar fascia  to the long, ring and small fingers followed by the meticulous removal of  the pretendinous fibers.  Septa to the metacarpals were isolated after  protection of the neurovascular bundles and resected.  The natatory  ligaments between the long and ring fingers and ring and small fingers were  dissected undermining the web flap and excised completely.   Jacob Marsh had complex retrovascular cords in the small finger and on the  ulnar aspect of the ring finger and a very extensive lateral fascial sheath  involvement both radial and ulnar in the small finger and a central cord in  the small finger.  All the pathologic fascia was meticulously removed while  protecting the neurovascular structures except for a few cutaneous branches  that were by necessity sacrificed by elevating the skin flaps.  The fascia  was entirely removed followed by recovery of full extension of the MP and IP  joints.  Joint releases or flexor sheath releases were not required.  Bleeding points were electrocauterized with bipolar current throughout the  dissection.  After completion of removal of the fascia and a tourniquet time  of one hour and 40 minutes, the tourniquet was released.  There was  immediate capillary refill in all skin flaps and the web flap.  Hemostasis  was achieved with bipolar cautery and direct pressure followed by thorough  irrigation of the wound with sterile saline.  The skin flaps were then  repaired with V-Y advancement flaps to lengthen the skin to the small finger  at the level of the distal palmar crease.  The wounds were loosely closed and dressed with Adaptic, Silvadene, sterile gauze, sterile Kerlix , sterile  Webril, acrylic fluff and a  volar plaster splint maintaining the hand in an  extended posture with the long, ring and small fingers fully extended.  There were no apparent complications.   Jacob Marsh awakened from anesthesia and transferred to recovery room with  stable vital signs.  He will be transferred to recovery care center for  observation of his vital signs and will be discharged with prescriptions for  Dilaudid 2 mg one or two tablets by mouth every four to six hours as needed  for pain, 30 tablets without refill, also Motrin 600 mg by mouth every six  hours as needed for pain and Keflex 500 mg  one by mouth every eight hours  times four days as a prophylactic antibiotic.                                               Katy Fitch Naaman Plummer., M.D.    RVS/MEDQ  D:  06/26/2003  T:  06/26/2003  Job:  [id number]

## 2011-03-14 ENCOUNTER — Encounter: Payer: Self-pay | Admitting: Family Medicine

## 2011-03-18 ENCOUNTER — Ambulatory Visit: Payer: Self-pay | Admitting: Family Medicine

## 2011-05-07 ENCOUNTER — Telehealth: Payer: Self-pay | Admitting: *Deleted

## 2011-05-07 NOTE — Telephone Encounter (Signed)
Patient says that when he had his colonoscopy last year they found a polyp which was benign. He says that he needs the actual diagnosis code that was used for insurance purposes. I did not see it anywhere in his chart.

## 2011-05-07 NOTE — Telephone Encounter (Signed)
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

## 2011-05-07 NOTE — Telephone Encounter (Signed)
Spoke with Dr Ardell Isaacs office and they could not find the code used for insurance; suggested calling physician billing at [phone]. Spoke with Candace at [phone] and she said code used was V76.51. Patient notified as instructed by telephone.

## 2012-06-22 ENCOUNTER — Other Ambulatory Visit: Payer: Self-pay

## 2012-08-09 ENCOUNTER — Telehealth: Payer: Self-pay | Admitting: Family Medicine

## 2012-08-09 DIAGNOSIS — Z0000 Encounter for general adult medical examination without abnormal findings: Secondary | ICD-10-CM | POA: Insufficient documentation

## 2012-08-09 DIAGNOSIS — Z125 Encounter for screening for malignant neoplasm of prostate: Secondary | ICD-10-CM | POA: Insufficient documentation

## 2012-08-09 NOTE — Telephone Encounter (Signed)
Message copied by Judy Pimple on Tue Aug 09, 2012  9:23 PM ------      Message from: Alvina Chou      Created: Thu Aug 04, 2012 11:17 AM      Regarding: Lab orders for Wednesday 10.15.13       Patient is scheduled for CPX labs, please order future labs, Thanks , Camelia Eng

## 2012-08-10 ENCOUNTER — Other Ambulatory Visit (INDEPENDENT_AMBULATORY_CARE_PROVIDER_SITE_OTHER): Payer: BC Managed Care – PPO

## 2012-08-10 DIAGNOSIS — Z125 Encounter for screening for malignant neoplasm of prostate: Secondary | ICD-10-CM

## 2012-08-10 DIAGNOSIS — Z0000 Encounter for general adult medical examination without abnormal findings: Secondary | ICD-10-CM

## 2012-08-10 LAB — CBC WITH DIFFERENTIAL/PLATELET
Basophils Absolute: 0 10*3/uL (ref 0.0–0.1)
Basophils Relative: 0.3 % (ref 0.0–3.0)
Eosinophils Absolute: 0.1 10*3/uL (ref 0.0–0.7)
Eosinophils Relative: 1.1 % (ref 0.0–5.0)
HCT: 47 % (ref 39.0–52.0)
Hemoglobin: 15.4 g/dL (ref 13.0–17.0)
Lymphocytes Relative: 20.3 % (ref 12.0–46.0)
Lymphs Abs: 1.4 10*3/uL (ref 0.7–4.0)
MCHC: 32.7 g/dL (ref 30.0–36.0)
MCV: 95.8 fl (ref 78.0–100.0)
Monocytes Absolute: 0.5 10*3/uL (ref 0.1–1.0)
Monocytes Relative: 7.8 % (ref 3.0–12.0)
Neutro Abs: 4.7 10*3/uL (ref 1.4–7.7)
Neutrophils Relative %: 70.5 % (ref 43.0–77.0)
Platelets: 175 10*3/uL (ref 150.0–400.0)
RBC: 4.9 Mil/uL (ref 4.22–5.81)
RDW: 12.8 % (ref 11.5–14.6)
WBC: 6.7 10*3/uL (ref 4.5–10.5)

## 2012-08-10 LAB — COMPREHENSIVE METABOLIC PANEL
ALT: 16 U/L (ref 0–53)
AST: 20 U/L (ref 0–37)
Albumin: 3.8 g/dL (ref 3.5–5.2)
Alkaline Phosphatase: 49 U/L (ref 39–117)
BUN: 13 mg/dL (ref 6–23)
CO2: 27 mEq/L (ref 19–32)
Calcium: 9.4 mg/dL (ref 8.4–10.5)
Chloride: 104 mEq/L (ref 96–112)
Creatinine, Ser: 0.9 mg/dL (ref 0.4–1.5)
GFR: 88.33 mL/min (ref >60.00)
Glucose, Bld: 110 mg/dL — ABNORMAL HIGH (ref 70–99)
Potassium: 3.9 mEq/L (ref 3.5–5.1)
Sodium: 138 mEq/L (ref 135–145)
Total Bilirubin: 0.5 mg/dL (ref 0.3–1.2)
Total Protein: 6.4 g/dL (ref 6.0–8.3)

## 2012-08-10 LAB — LIPID PANEL
Cholesterol: 170 mg/dL (ref 0–200)
HDL: 43.8 mg/dL (ref >39.00)
LDL Cholesterol: 106 mg/dL — ABNORMAL HIGH (ref 0–99)
Total CHOL/HDL Ratio: 4
Triglycerides: 99 mg/dL (ref 0.0–149.0)
VLDL: 19.8 mg/dL (ref 0.0–40.0)

## 2012-08-10 LAB — PSA: PSA: 1.68 ng/mL (ref 0.10–4.00)

## 2012-08-10 LAB — TSH: TSH: 3.28 u[IU]/mL (ref 0.35–5.50)

## 2012-08-12 ENCOUNTER — Encounter: Payer: Self-pay | Admitting: Emergency Medicine

## 2012-08-12 ENCOUNTER — Ambulatory Visit (INDEPENDENT_AMBULATORY_CARE_PROVIDER_SITE_OTHER): Payer: BC Managed Care – PPO | Admitting: Emergency Medicine

## 2012-08-12 ENCOUNTER — Emergency Department (HOSPITAL_COMMUNITY)
Admission: EM | Admit: 2012-08-12 | Discharge: 2012-08-12 | Disposition: A | Discharge status: Home / Self Care | Payer: BC Managed Care – PPO | Attending: Emergency Medicine | Admitting: Emergency Medicine

## 2012-08-12 ENCOUNTER — Encounter (HOSPITAL_COMMUNITY): Payer: Self-pay | Admitting: *Deleted

## 2012-08-12 ENCOUNTER — Emergency Department (HOSPITAL_COMMUNITY): Payer: BC Managed Care – PPO

## 2012-08-12 VITALS — BP 114/80 | HR 63 | Temp 97.7°F | Resp 16 | Ht 72.0 in | Wt 168.0 lb

## 2012-08-12 DIAGNOSIS — R079 Chest pain, unspecified: Secondary | ICD-10-CM | POA: Insufficient documentation

## 2012-08-12 DIAGNOSIS — R0789 Other chest pain: Secondary | ICD-10-CM | POA: Insufficient documentation

## 2012-08-12 LAB — BASIC METABOLIC PANEL
BUN: 13 mg/dL (ref 6–23)
CO2: 27 mEq/L (ref 19–32)
Calcium: 9.2 mg/dL (ref 8.4–10.5)
Chloride: 103 mEq/L (ref 96–112)
Creatinine, Ser: 0.8 mg/dL (ref 0.50–1.35)
GFR calc Af Amer: >90 mL/min (ref >90)
GFR calc non Af Amer: >90 mL/min (ref >90)
Glucose, Bld: 86 mg/dL (ref 70–99)
Potassium: 3.6 mEq/L (ref 3.5–5.1)
Sodium: 140 mEq/L (ref 135–145)

## 2012-08-12 LAB — CBC WITH DIFFERENTIAL/PLATELET
Basophils Absolute: 0 10*3/uL (ref 0.0–0.1)
Basophils Relative: 0 % (ref 0–1)
Eosinophils Absolute: 0.1 10*3/uL (ref 0.0–0.7)
Eosinophils Relative: 1 % (ref 0–5)
HCT: 44.4 % (ref 39.0–52.0)
Hemoglobin: 15.4 g/dL (ref 13.0–17.0)
Lymphocytes Relative: 26 % (ref 12–46)
Lymphs Abs: 1.9 10*3/uL (ref 0.7–4.0)
MCH: 31.4 pg (ref 26.0–34.0)
MCHC: 34.7 g/dL (ref 30.0–36.0)
MCV: 90.6 fL (ref 78.0–100.0)
Monocytes Absolute: 0.6 10*3/uL (ref 0.1–1.0)
Monocytes Relative: 8 % (ref 3–12)
Neutro Abs: 4.8 10*3/uL (ref 1.7–7.7)
Neutrophils Relative %: 65 % (ref 43–77)
Platelets: 182 10*3/uL (ref 150–400)
RBC: 4.9 MIL/uL (ref 4.22–5.81)
RDW: 12.2 % (ref 11.5–15.5)
WBC: 7.4 10*3/uL (ref 4.0–10.5)

## 2012-08-12 LAB — TROPONIN I
Troponin I: <0.3 ng/mL (ref <0.30)
Troponin I: <0.3 ng/mL (ref <0.30)

## 2012-08-12 MED ORDER — NITROGLYCERIN 0.3 MG SL SUBL
0.4000 mg | SUBLINGUAL_TABLET | Freq: Once | SUBLINGUAL | Status: AC
  Administered 2012-08-12: 0.3 mg via SUBLINGUAL

## 2012-08-12 MED ORDER — ASPIRIN 81 MG PO CHEW
324.0000 mg | CHEWABLE_TABLET | Freq: Once | ORAL | Status: AC
  Administered 2012-08-12: 324 mg via ORAL

## 2012-08-12 NOTE — ED Notes (Signed)
From [facility] - C/o left chest pressure onset last night. Given ASA 324mg , NTG x1 at office, received another NTG x1 enroute by EMS

## 2012-08-12 NOTE — Progress Notes (Signed)
  Subjective:    Patient ID: Jacob Marsh, male    DOB: 27-May-1951, 61 y.o.   MRN: [id number]  Chest Pain  This is a new problem. The current episode started yesterday. The onset quality is sudden. The problem occurs constantly. The problem has been unchanged. The pain is present in the substernal region. The pain is at a severity of 4/10. The pain is moderate. The quality of the pain is described as dull and pressure. The pain does not radiate. Pertinent negatives include no abdominal pain, back pain, claudication, cough, diaphoresis, dizziness, exertional chest pressure, fever, headaches, hemoptysis, irregular heartbeat, leg pain, lower extremity edema, malaise/fatigue, nausea, near-syncope, numbness, orthopnea, palpitations, PND, shortness of breath, sputum production, syncope, vomiting or weakness. The pain is aggravated by nothing. He has tried nothing for the symptoms. Risk factors include male gender.  Pertinent negatives for past medical history include no aneurysm, no anxiety/panic attacks, no aortic aneurysm, no aortic dissection, no arrhythmia, no bicuspid aortic valve, no CAD, no cancer, no congenital heart disease, no connective tissue disease, no COPD, no CHF, no diabetes, no DVT, no hyperhomocysteinemia, no hyperlipidemia, no hypertension, no Marfan's syndrome, no MI, no mitral valve prolapse, no pacemaker, no PE, no PVD, no recent injury, no rheumatic fever, no seizures, no sickle cell disease, no sleep apnea, no spontaneous pneumothorax, no stimulant use, no strokes, no thyroid problem, no TIA and Turner syndrome.  His family medical history is significant for CAD in family, heart disease in family and hypertension in family.  Pertinent negatives for family medical history include: family history of aortic dissection, no connective tissue disease in family, no diabetes in family, no hyperlipidemia in family, no Marfan's syndrome in family, no early MI in family, no PE in family, no  PVD in family, no sickle cell disease in family, no stroke in family, no sudden death in family and no TIA in family.      Review of Systems  Constitutional: Negative.  Negative for fever, malaise/fatigue and diaphoresis.  HENT: Negative.   Eyes: Negative.   Respiratory: Negative.  Negative for cough, hemoptysis, sputum production and shortness of breath.   Cardiovascular: Positive for chest pain. Negative for palpitations, orthopnea, claudication, syncope, PND and near-syncope.  Gastrointestinal: Negative for nausea, vomiting and abdominal pain.  Genitourinary: Negative.   Musculoskeletal: Negative.  Negative for back pain.  Neurological: Negative.  Negative for dizziness, seizures, weakness, numbness and headaches.       Objective:   Physical Exam  Constitutional: He is oriented to person, place, and time. He appears well-developed and well-nourished.  HENT:  Head: Normocephalic and atraumatic.  Mouth/Throat: No oropharyngeal exudate.  Eyes: Pupils are equal, round, and reactive to light. No scleral icterus.  Neck: Normal range of motion. Neck supple.  Cardiovascular: Normal rate, regular rhythm and normal heart sounds.   Pulmonary/Chest: Effort normal and breath sounds normal.  Musculoskeletal: Normal range of motion.  Neurological: He is alert and oriented to person, place, and time.  Skin: Skin is warm and dry.          Assessment & Plan:  Chest pain r/o AMI  EKG no acute changes    TO ER with IV, O2,  EMS called  I have reviewed and agree with documentation. Robert P. Merla Riches, M.D.

## 2012-08-12 NOTE — ED Notes (Signed)
Onset last evening while at rest left chest pressure/heaviness worse upon awakening this morning. Denies SOB, n/v, diaphoresis, fever, cold, cough. CP improved after NTG x2

## 2012-08-12 NOTE — Discharge Instructions (Signed)
Chest Pain Observation   It is often hard to give a specific diagnosis for the cause of chest pain. Your symptoms had a chance of being caused by inadequate oxygen delivery to your heart (angina). Angina that is not treated or evaluated can lead to a heart attack (myocardial infarction, MI) or death.   Blood tests, electrocardiograms, and X-rays may have been done to help determine a possible cause of your chest pain. After evaluation and observation, your caregiver has determined that it is unlikely your pain was caused by angina. However, a full evaluation of your pain needs to be completed. You need to follow up with caregivers or diagnostic testing as directed. It is very important to keep your follow-up appointments. Not keeping your follow-up appointments could result in permanent heart damage, disability, or death. If there is any problem keeping your follow-up appointments, you must call your caregiver.   HOME CARE INSTRUCTIONS   Due to the slight chance that your pain could be angina, it is important to follow healthy lifestyle habits and follow your caregiver's treatment plan:   Maintain a healthy weight.   Stay physically active and exercise regularly.   Decrease your salt intake.   Eat a diet low in saturated fats and cholesterol. Avoid foods fried in oil or made with fat. Talk to a dietician to learn about heart healthy foods.   Increase your fiber intake by including whole grains, vegetable, and fruits in your diet.   Avoid situations that cause stress, anger, or depression.   Take medication as advised by your caregiver. Report any side effects to your caregiver. Do not stop medications or adjust the dosages on your own.   Quit smoking. Do not use nicotine patches or gum until you check with your caregiver.   Keep your blood pressure, blood sugar, and cholesterol levels within normal limits.   Limit alcohol intake to no more than 1 drink per day for nonpregnant women and 2 drinks per day for men.    Stop abusing drugs.  SEEK MEDICAL CARE IF:   You have severe chest pain or pressure which may include symptoms such as:   Pain or pressure in the arms, neck, jaw, or back.   Profuse sweating.   Feeling sick to your stomach (nauseous).   Feeling short of breath while at rest.   Having a fast or irregular heartbeat.   You have chest pain that does not get better after rest or after taking your usual medicine.   You wake from sleep with chest pain.   You feel dizzy, faint, or experience extreme fatigue.   You notice increasing shortness of breath during rest, sleep, or with activity.   You are unable to sleep because you cannot breathe.   You develop a frequent cough or you are coughing up blood.   You have severe back or abdominal pain, are nauseated, or throw up (vomit).   You develop severe weakness, dizziness, fainting, or chills.  Any of these symptoms may represent a serious problem that is an emergency. Do not wait to see if the symptoms will go away. Call your local emergency services (911 in the [place] Do not drive yourself to the hospital.   MAKE SURE YOU:   Understand these instructions.   Will watch your condition.   Will get help right away if you are not doing well or get worse.  Document Released: 11/14/2010 Document Revised: 01/04/2012 Document Reviewed: 11/14/2010   [facility] Patient Information 2013 [employer]

## 2012-08-12 NOTE — ED Provider Notes (Signed)
Pt to CDU while awaiting result of delta troponin.  If neg, and pt is cp free, then pt to f/u with his PCP, Dr. Milinda Antis as previously scheduled.  This is a shared visit with Dr. Rubin Payor.   On examination he appeared in good health and spirits. Vital signs as documented. Skin warm and dry and without overt rashes. Neck without JVD. Lungs clear. Heart exam notable for regular rhythm, normal sounds and absence of murmurs, rubs or gallops. Abdomen unremarkable and without evidence of organomegaly, masses, or abdominal aortic enlargement. Extremities nonedematous.  9:30 PM Patient has normal delta troponin.  labs result unremarkable. Chest x-ray and EKG are unremarkable. Patient is currently chest pain-free. Patient is stable to be discharged. He will followup with his PCP on Wednesday as previously scheduled. Strict return precautions discussed.  BP 129/81  Pulse 58  Temp 97.4 F (36.3 C) (Oral)  Resp 20  SpO2 97%  Nursing notes reviewed and considered in documentation  Previous records reviewed and considered  All labs/vitals reviewed and considered  xrays reviewed and considered  Results for orders placed during the hospital encounter of 08/12/12  CBC WITH DIFFERENTIAL      Component Value Range   WBC 7.4  4.0 - 10.5 K/uL   RBC 4.90  4.22 - 5.81 MIL/uL   Hemoglobin 15.4  13.0 - 17.0 g/dL   HCT 16.1  09.6 - 04.5 %   MCV 90.6  78.0 - 100.0 fL   MCH 31.4  26.0 - 34.0 pg   MCHC 34.7  30.0 - 36.0 g/dL   RDW 40.9  81.1 - 91.4 %   Platelets 182  150 - 400 K/uL   Neutrophils Relative 65  43 - 77 %   Neutro Abs 4.8  1.7 - 7.7 K/uL   Lymphocytes Relative 26  12 - 46 %   Lymphs Abs 1.9  0.7 - 4.0 K/uL   Monocytes Relative 8  3 - 12 %   Monocytes Absolute 0.6  0.1 - 1.0 K/uL   Eosinophils Relative 1  0 - 5 %   Eosinophils Absolute 0.1  0.0 - 0.7 K/uL   Basophils Relative 0  0 - 1 %   Basophils Absolute 0.0  0.0 - 0.1 K/uL  BASIC METABOLIC PANEL      Component Value Range   Sodium 140   135 - 145 mEq/L   Potassium 3.6  3.5 - 5.1 mEq/L   Chloride 103  96 - 112 mEq/L   CO2 27  19 - 32 mEq/L   Glucose, Bld 86  70 - 99 mg/dL   BUN 13  6 - 23 mg/dL   Creatinine, Ser 7.82  0.50 - 1.35 mg/dL   Calcium 9.2  8.4 - 95.6 mg/dL   GFR calc non Af Amer >90  >90 mL/min   GFR calc Af Amer >90  >90 mL/min  TROPONIN I      Component Value Range   Troponin I <0.30  <0.30 ng/mL  TROPONIN I      Component Value Range   Troponin I <0.30  <0.30 ng/mL   Dg Chest 2 View  08/12/2012  *RADIOLOGY REPORT*  Clinical Data: Pain, chest pressure  CHEST - 2 VIEW  Comparison: None.  Findings: Cardiomediastinal silhouette is unremarkable.  Mild hyperinflation.  No acute infiltrate or pleural effusion.  No pulmonary edema.  IMPRESSION: Mild hyperinflation.  No active disease.   Original Report Authenticated By: Natasha Mead, M.D.  Fayrene Helper, PA-C 08/12/12 2132

## 2012-08-12 NOTE — ED Provider Notes (Signed)
History     CSN: [id number]  Arrival date & time 08/12/12  1539   First MD Initiated Contact with Patient 08/12/12 1547      Chief Complaint  Patient presents with  . Chest Pain    (Consider location/radiation/quality/duration/timing/severity/associated sxs/prior treatment) Patient is a 61 y.o. male presenting with chest pain. The history is provided by the patient.  Chest Pain Pertinent negatives for primary symptoms include no shortness of breath, no abdominal pain, no nausea and no vomiting.  Pertinent negatives for associated symptoms include no numbness and no weakness.    patient presents from urgent care with chest pain. He states he's had pressure that began last night was little worse this morning. It resolved with nitroglycerin. No previous cardiac history. No trouble breathing. It is not worse with exertion. He does not smoke. No diaphoresis. No lightheadedness or dizziness. He is an anterior pressure. He feels much better now  Past Medical History  Diagnosis Date  . Depression   . Diverticulosis     History of diverticulosis// flex sig 2006//colonoscopy 01/21/2000 diverticulosis    Past Surgical History  Procedure Date  . Tonsillectomy   . Dupuytren contracture release   . Knee arthroscopy w/ meniscal repair     Family History  Problem Relation Age of Onset  . Arthritis Mother   . Heart disease Mother     ? atrial fib  . Depression Sister   . Cancer Maternal Aunt     ?colon cancer  . Heart disease Maternal Grandmother     CAD  . Cancer Maternal Grandmother     liver cancer  . Heart disease Maternal Grandfather     CAD    History  Substance Use Topics  . Smoking status: Never Smoker   . Smokeless tobacco: Not on file  . Alcohol Use: 0.5 oz/week    1 drink(s) per week      Review of Systems  Constitutional: Negative for activity change and appetite change.  HENT: Negative for neck stiffness.   Eyes: Negative for pain.  Respiratory: Negative  for chest tightness and shortness of breath.   Cardiovascular: Positive for chest pain. Negative for leg swelling.  Gastrointestinal: Negative for nausea, vomiting, abdominal pain and diarrhea.  Genitourinary: Negative for flank pain.  Musculoskeletal: Negative for back pain.  Skin: Negative for rash.  Neurological: Negative for weakness, numbness and headaches.  Psychiatric/Behavioral: Negative for behavioral problems.    Allergies  Review of patient's allergies indicates no known allergies.  Home Medications   Current Outpatient Rx  Name Route Sig Dispense Refill  . ADULT MULTIVITAMIN W/MINERALS CH Oral Take 1 tablet by mouth daily.      BP 129/81  Pulse 58  Temp 97.4 F (36.3 C) (Oral)  Resp 20  SpO2 97%  Physical Exam  Nursing note and vitals reviewed. Constitutional: He is oriented to person, place, and time. He appears well-developed and well-nourished.  HENT:  Head: Normocephalic and atraumatic.  Eyes: EOM are normal. Pupils are equal, round, and reactive to light.  Neck: Normal range of motion. Neck supple.  Cardiovascular: Normal rate, regular rhythm and normal heart sounds.   No murmur heard. Pulmonary/Chest: Effort normal and breath sounds normal.  Abdominal: Soft. Bowel sounds are normal. He exhibits no distension and no mass. There is no tenderness. There is no rebound and no guarding.  Musculoskeletal: Normal range of motion. He exhibits no edema.  Neurological: He is alert and oriented to person, place, and time. No  cranial nerve deficit.  Skin: Skin is warm and dry.  Psychiatric: He has a normal mood and affect.    ED Course  Procedures (including critical care time)   Labs Reviewed  CBC WITH DIFFERENTIAL  BASIC METABOLIC PANEL  TROPONIN I  TROPONIN I   Dg Chest 2 View  08/12/2012  *RADIOLOGY REPORT*  Clinical Data: Pain, chest pressure  CHEST - 2 VIEW  Comparison: None.  Findings: Cardiomediastinal silhouette is unremarkable.  Mild  hyperinflation.  No acute infiltrate or pleural effusion.  No pulmonary edema.  IMPRESSION: Mild hyperinflation.  No active disease.   Original Report Authenticated By: Natasha Mead, M.D.      1. Chest pain      Date: 08/12/2012  Rate: 56  Rhythm: sinus bradycardia  QRS Axis: normal  Intervals: normal  ST/T Wave abnormalities: normal  Conduction Disutrbances:nonspecific intraventricular conduction delay  Narrative Interpretation:   Old EKG Reviewed: unchanged    MDM  Patient with left chest pressure. Much improved after sublingual nitroglycerin. EKG is reassuring enzymes are negative x2. X-ray does not show a clear cause. Patient artery has followup with his primary care Dr. will be discharged.        Juliet Rude. Rubin Payor, MD 08/13/12 [phone]

## 2012-08-13 NOTE — ED Provider Notes (Signed)
Medical screening examination/treatment/procedure(s) were conducted as a shared visit with non-physician practitioner(s) and myself.  I personally evaluated the patient during the encounter  Jacob Marsh. Rubin Payor, MD 08/13/12 [phone]

## 2012-08-17 ENCOUNTER — Encounter: Payer: Self-pay | Admitting: Family Medicine

## 2012-08-17 ENCOUNTER — Ambulatory Visit (INDEPENDENT_AMBULATORY_CARE_PROVIDER_SITE_OTHER): Payer: BC Managed Care – PPO | Admitting: Family Medicine

## 2012-08-17 VITALS — BP 128/62 | HR 60 | Temp 97.7°F | Ht 72.0 in | Wt 169.2 lb

## 2012-08-17 DIAGNOSIS — R079 Chest pain, unspecified: Secondary | ICD-10-CM

## 2012-08-17 DIAGNOSIS — Z0000 Encounter for general adult medical examination without abnormal findings: Secondary | ICD-10-CM

## 2012-08-17 DIAGNOSIS — Z125 Encounter for screening for malignant neoplasm of prostate: Secondary | ICD-10-CM

## 2012-08-17 DIAGNOSIS — Z87898 Personal history of other specified conditions: Secondary | ICD-10-CM | POA: Insufficient documentation

## 2012-08-17 DIAGNOSIS — Z23 Encounter for immunization: Secondary | ICD-10-CM

## 2012-08-17 NOTE — Assessment & Plan Note (Signed)
Reviewed health habits including diet and exercise and skin cancer prevention Also reviewed health mt list, fam hx and immunizations  Wellness labs reviewed Fairly good lipids Good health habits

## 2012-08-17 NOTE — Assessment & Plan Note (Addendum)
Now resolved Had ER visit and f/u was suggested (he declined overnt stay in hosp) hosp records reviewed today May be stress rel/ atypical However pt also mentions today - hx of rapid HR several times over the past 2 mo  Will ref to cardiol

## 2012-08-17 NOTE — Patient Instructions (Addendum)
Flu vaccine today If you are interested in a shingles/zoster vaccine - call your insurance to check on coverage,( you should not get it within 1 month of other vaccines) , then call us for a prescription  for it to take to a pharmacy that gives the shot , or make a nurse visit to get it here depending on your coverage We will refer you to cardiology at check out  If your chest pain returns please call or seek care immediately

## 2012-08-17 NOTE — Assessment & Plan Note (Signed)
Nl / stable psa, no symptoms and nl DRE today

## 2012-08-17 NOTE — Progress Notes (Signed)
Subjective:    Patient ID: Jacob Marsh, male    DOB: June 01, 1951, 61 y.o.   MRN: [id number]  HPI Here for health maintenance exam and to review chronic medical problems    Wt is stable with bmi of 22  Seen in ER recently for atypical chest pain  Nl EKG- with ? RBBB  Nl cardiac enzymes  Wanted to keep him overnight - and he declined Told to follow up  He has not had a stress test yet  (did have one 30 years ago)  Thinks in retrospect his cp was from stress Several months ago had a run of rapid heart rate 6-8 hours and then that happened again 2 weeks later   He has cut his caffeine in 1/2  Probably about 3-4 cups a day (from 10-12)  He declines a need for counselor for stress  Things are a little better  Has health ins now, and work is better   Zoster status Has not had shingles or vaccine    Flu vaccine Has not had that yet    colonosc 6/11- 5 year recall for adenomatous polyp    Lab Results  Component Value Date   PSA 1.68 08/10/2012   PSA 1.76 03/19/2009    Hx of low platelet in past but nl now Lab Results  Component Value Date   WBC 7.4 08/12/2012   HGB 15.4 08/12/2012   HCT 44.4 08/12/2012   MCV 90.6 08/12/2012   PLT 182 08/12/2012     Lipid Lab Results  Component Value Date   CHOL 170 08/10/2012   CHOL 177 03/19/2009   Lab Results  Component Value Date   HDL 43.80 08/10/2012   HDL 49.00 03/19/2009   Lab Results  Component Value Date   LDLCALC 106* 08/10/2012   LDLCALC 112* 03/19/2009   Lab Results  Component Value Date   TRIG 99.0 08/10/2012   TRIG 82.0 03/19/2009   Lab Results  Component Value Date   CHOLHDL 4 08/10/2012   CHOLHDL 4 03/19/2009   No results found for this basename: LDLDIRECT   not a bad cholesterol profile Diet is fairly healthy  Patient Active Problem List  Diagnosis  . THROMBOCYTOPENIA  . URI  . Routine general medical examination at a health care facility  . Prostate cancer screening   Past Medical  History  Diagnosis Date  . Depression   . Diverticulosis     History of diverticulosis// flex sig 2006//colonoscopy 01/21/2000 diverticulosis   Past Surgical History  Procedure Date  . Tonsillectomy   . Dupuytren contracture release   . Knee arthroscopy w/ meniscal repair    History  Substance Use Topics  . Smoking status: Never Smoker   . Smokeless tobacco: Not on file  . Alcohol Use: 0.5 oz/week    1 drink(s) per week     social- 1-2 beer   Family History  Problem Relation Age of Onset  . Arthritis Mother   . Heart disease Mother     ? atrial fib  . Depression Sister   . Cancer Maternal Aunt     ?colon cancer  . Heart disease Maternal Grandmother     CAD  . Cancer Maternal Grandmother     liver cancer  . Heart disease Maternal Grandfather     CAD   No Known Allergies Current Outpatient Prescriptions on File Prior to Visit  Medication Sig Dispense Refill  . Multiple Vitamin (MULTIVITAMIN WITH MINERALS) TABS Take 1 tablet by  mouth daily.          Review of Systems Review of Systems  Constitutional: Negative for fever, appetite change, fatigue and unexpected weight change.  Eyes: Negative for pain and visual disturbance.  Respiratory: Negative for cough and shortness of breath.   Cardiovascular: Negative for cp and pos for occ palpitations , neg for diaphoresis/ nausea/ exertional symptoms  Gastrointestinal: Negative for nausea, diarrhea and constipation.  Genitourinary: Negative for urgency and frequency.  Skin: Negative for pallor or rash   Neurological: Negative for weakness, light-headedness, numbness and headaches.  Hematological: Negative for adenopathy. Does not bruise/bleed easily.  Psychiatric/Behavioral: Negative for dysphoric mood. The patient is not nervous/anxious.  pos for severe stressors       Objective:   Physical Exam  Constitutional: He appears well-developed and well-nourished. No distress.  HENT:  Head: Normocephalic and atraumatic.    Right Ear: External ear normal.  Left Ear: External ear normal.  Nose: Nose normal.  Mouth/Throat: Oropharynx is clear and moist.  Eyes: Conjunctivae normal and EOM are normal. Pupils are equal, round, and reactive to light. Right eye exhibits no discharge. Left eye exhibits no discharge. No scleral icterus.  Neck: Normal range of motion. Neck supple. No JVD present. Carotid bruit is not present. No thyromegaly present.  Cardiovascular: Normal rate, regular rhythm, normal heart sounds and intact distal pulses.  Exam reveals no gallop.   Pulmonary/Chest: Effort normal and breath sounds normal. No respiratory distress. He has no wheezes.  Abdominal: Soft. Bowel sounds are normal. He exhibits no distension, no abdominal bruit and no mass. There is no tenderness.  Genitourinary: Rectum normal. Prostate is not enlarged and not tender.  Musculoskeletal: He exhibits no edema and no tenderness.  Lymphadenopathy:    He has no cervical adenopathy.  Neurological: He is alert. He has normal reflexes. No cranial nerve deficit. He exhibits normal muscle tone. Coordination normal.  Skin: Skin is warm and dry. No rash noted. No erythema. No pallor.  Psychiatric: He has a normal mood and affect.          Assessment & Plan:

## 2012-08-18 ENCOUNTER — Encounter: Payer: Self-pay | Admitting: Cardiovascular Disease

## 2012-08-18 ENCOUNTER — Ambulatory Visit (INDEPENDENT_AMBULATORY_CARE_PROVIDER_SITE_OTHER): Payer: BC Managed Care – PPO | Admitting: Cardiovascular Disease

## 2012-08-18 VITALS — BP 119/74 | HR 72 | Wt 171.0 lb

## 2012-08-18 DIAGNOSIS — Z87898 Personal history of other specified conditions: Secondary | ICD-10-CM

## 2012-08-18 DIAGNOSIS — R079 Chest pain, unspecified: Secondary | ICD-10-CM

## 2012-08-18 NOTE — Assessment & Plan Note (Signed)
Atypical Improveing F/U ETT

## 2012-08-18 NOTE — Patient Instructions (Signed)
Your physician recommends that you schedule a follow-up appointment in: AS NEEDED  Your physician has requested that you have an exercise tolerance test. For further information please visit [email or web address]. Please also follow instruction sheet, as given. DX CHEST PAIN 

## 2012-08-18 NOTE — Progress Notes (Signed)
Patient ID: Jacob Marsh, male   DOB: 1950/12/25, 61 y.o.   MRN: [id number] 61 yo with chest pain  Seen in ER 10/18 with no ECG changes and negative enzymes Some anxiety and palitations  Has had some financial stressors and job stresses Wife had a "nervous" breakdown 6 months ago.  SSCP atypical sharp center chest not related to exercise.  Had episodes 30 years ago in [place] with normal ETT.  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  Chest pain is less freuquent now.  No alleviating factors and can last seconds to minutes  ROS: Denies fever, malais, weight loss, blurry vision, decreased visual acuity, cough, sputum, SOB, hemoptysis, pleuritic pain, palpitaitons, heartburn, abdominal pain, melena, lower extremity edema, claudication, or rash.  All other systems reviewed and negative   General: Affect appropriate Healthy:  appears stated age HEENT: normal Neck supple with no adenopathy JVP normal no bruits no thyromegaly Lungs clear with no wheezing and good diaphragmatic motion Heart:  S1/S2 no murmur,rub, gallop or click PMI normal Abdomen: benighn, BS positve, no tenderness, no AAA no bruit.  No HSM or HJR Distal pulses intact with no bruits No edema Neuro non-focal Skin warm and dry No muscular weakness  Medications Current Outpatient Prescriptions  Medication Sig Dispense Refill  . Multiple Vitamin (MULTIVITAMIN WITH MINERALS) TABS Take 1 tablet by mouth daily.        Allergies Review of patient's allergies indicates no known allergies.  Family History: Family History  Problem Relation Age of Onset  . Arthritis Mother   . Heart disease Mother     ? atrial fib  . Depression Sister   . Cancer Maternal Aunt     ?colon cancer  . Heart disease Maternal Grandmother     CAD  . Cancer Maternal Grandmother     liver cancer  . Heart disease Maternal Grandfather     CAD    Social History: History   Social History  .  Marital Status: Married    Spouse Name: N/A    Number of Children: N/A  . Years of Education: N/A   Occupational History  . Not on file.   Social History Main Topics  . Smoking status: Never Smoker   . Smokeless tobacco: Not on file  . Alcohol Use: 0.5 oz/week    1 drink(s) per week     social- 1-2 beer  . Drug Use: No  . Sexually Active: Not on file   Other Topics Concern  . Not on file   Social History Narrative  . No narrative on file    Electrocardiogram:  NSR ICRBBB no acute ischemic changes  Assessment and Plan

## 2012-09-06 ENCOUNTER — Encounter: Payer: Self-pay | Admitting: Nurse Practitioner

## 2012-09-06 ENCOUNTER — Ambulatory Visit (INDEPENDENT_AMBULATORY_CARE_PROVIDER_SITE_OTHER): Payer: BC Managed Care – PPO | Admitting: Nurse Practitioner

## 2012-09-06 VITALS — BP 136/74 | HR 85

## 2012-09-06 DIAGNOSIS — R079 Chest pain, unspecified: Secondary | ICD-10-CM

## 2012-09-06 NOTE — Progress Notes (Signed)
Exercise Treadmill Test  Pre-Exercise Testing Evaluation Rhythm: Sinus Rhythm with frequent PVC's  Rate: 61   PR:  .17 QRS:  .11  QT:  .42 QTc: .42           Test  Exercise Tolerance Test Ordering MD: Charlton Haws, MD  Interpreting MD: Norma Fredrickson, NP  Unique Test No: 1  Treadmill:  1  Indication for ETT: chest pain - rule out ischemia  Contraindication to ETT: No   Stress Modality: exercise - treadmill  Cardiac Imaging Performed: non   Protocol: standard Bruce - maximal  Max BP:  162/86  Max MPHR (bpm):  157 85% MPR (bpm):  133  MPHR obtained (bpm):  160 % MPHR obtained:  101%  Reached 85% MPHR (min:sec):  8:53 Total Exercise Time (min-sec):  12:00  Workload in METS:  13.4 Borg Scale: 16  Reason ETT Terminated:  desired heart rate attained    ST Segment Analysis At Rest: normal ST segments - no evidence of significant ST depression With Exercise: no evidence of significant ST depression  Other Information Arrhythmia:  Yes Angina during ETT:  absent (0) Quality of ETT:  diagnostic  ETT Interpretation:  normal - no evidence of ischemia by ST analysis  Comments: Patient presents today for routine GXT testing. He has had some atypical chest pain. No real CV risk factors. He reports that his symptoms have improved since his visit here last month.   Today, he exercised on the standard Bruce protocol for a total of 12 minutes. He has excellent exercise tolerance. Adequate blood pressure response. Clinically negative for chest pain. EKG negative for ischemia. Has RSR'. Noted to have very frequent PVC's and occasional couplets thru out the entire test. Patient was asymptomatic.     Recommendations: Will check an echo to rule out structural disease and assess EF in light of the PVC's/couplets. I have asked him to minimize his caffeine. We will see him back prn unless his study is abnormal.   Patient is agreeable to this plan and will call if any problems develop in the interim.

## 2012-09-06 NOTE — Patient Instructions (Signed)
We are going to check an ultrasound of your heart  Minimize your caffeine  We will see you back as needed or if your ultrasound is abnormal  Call the [facility] office at [phone] if you have any questions, problems or concerns.

## 2012-09-13 ENCOUNTER — Other Ambulatory Visit (HOSPITAL_COMMUNITY): Payer: BC Managed Care – PPO

## 2013-07-03 ENCOUNTER — Ambulatory Visit (INDEPENDENT_AMBULATORY_CARE_PROVIDER_SITE_OTHER): Payer: Managed Care, Other (non HMO) | Admitting: Physician Assistant

## 2013-07-03 ENCOUNTER — Encounter: Payer: Self-pay | Admitting: Physician Assistant

## 2013-07-03 VITALS — BP 112/75 | HR 71 | Ht 72.0 in | Wt 170.0 lb

## 2013-07-03 DIAGNOSIS — R55 Syncope and collapse: Secondary | ICD-10-CM

## 2013-07-03 DIAGNOSIS — R002 Palpitations: Secondary | ICD-10-CM

## 2013-07-03 LAB — CBC WITH DIFFERENTIAL/PLATELET
Basophils Absolute: 0 10*3/uL (ref 0.0–0.1)
Basophils Relative: 0.4 % (ref 0.0–3.0)
Eosinophils Absolute: 0 10*3/uL (ref 0.0–0.7)
Eosinophils Relative: 0.5 % (ref 0.0–5.0)
HCT: 46.2 % (ref 39.0–52.0)
Hemoglobin: 15.7 g/dL (ref 13.0–17.0)
Lymphocytes Relative: 20.8 % (ref 12.0–46.0)
Lymphs Abs: 1.5 10*3/uL (ref 0.7–4.0)
MCHC: 34 g/dL (ref 30.0–36.0)
MCV: 93.5 fl (ref 78.0–100.0)
Monocytes Absolute: 0.5 10*3/uL (ref 0.1–1.0)
Monocytes Relative: 7.2 % (ref 3.0–12.0)
Neutro Abs: 5.2 10*3/uL (ref 1.4–7.7)
Neutrophils Relative %: 71.1 % (ref 43.0–77.0)
Platelets: 164 10*3/uL (ref 150.0–400.0)
RBC: 4.94 Mil/uL (ref 4.22–5.81)
RDW: 12.6 % (ref 11.5–14.6)
WBC: 7.3 10*3/uL (ref 4.5–10.5)

## 2013-07-03 LAB — BASIC METABOLIC PANEL
BUN: 15 mg/dL (ref 6–23)
CO2: 29 mEq/L (ref 19–32)
Calcium: 9.4 mg/dL (ref 8.4–10.5)
Chloride: 105 mEq/L (ref 96–112)
Creatinine, Ser: 0.7 mg/dL (ref 0.4–1.5)
GFR: 115.03 mL/min (ref >60.00)
Glucose, Bld: 85 mg/dL (ref 70–99)
Potassium: 4 mEq/L (ref 3.5–5.1)
Sodium: 139 mEq/L (ref 135–145)

## 2013-07-03 NOTE — Progress Notes (Signed)
[address] [place], [place]  [address] Phone: [phone] Fax:  [phone]  Date:  07/03/2013   ID:  Rashidi, Loh 08-27-1951, MRN [id number]  PCP:  Roxy Manns, MD  Cardiologist:  Dr. Charlton Haws     History of Present Illness: Jacob Marsh is a 62 y.o. male who returns for evaluation of syncope.  He was evaluated by Dr. Eden Emms 07/2012 for chest pain. ETT was performed 09/06/12 and demonstrated no ischemia. Patient exercised for 12 minutes.   He is still struggling to recover financially from the 2008 recession.  He has had increased emotional stress over the last several weeks.  His wife has been dx with dementia.   He has not been sleeping very well. He's been drinking excessive amounts of caffeine (6-8 cups per day or more). He typically drinks about one beer per day. Recently, he has been drinking 2 beers a day. He recently had noted some orthostatic intolerance with lightheadedness upon standing. Three weeks ago, he stood up to go to the kitchen and felt lightheaded and dizzy.  Prior to standing, he felt as though his heart rate may be elevated. He try to take his pulse but was not certain if it was irregular. He states that "something did not feel right."  He tried to catch himself and fell to the floor on his buttocks. He denies any injury. He was likely out for seconds.  He denies any confusion upon awakening. There was no seizure-like activity reported. He denies any loss of bowel or bladder function.  Since this episode, he has cut way back on his caffeine intake. He has also resumed his one beer per day regimen. He feels much better. He continues to have occasional, very brief lightheadedness with standing. No further syncope.  Otherwise, he denies chest pain, shortness of breath, orthopnea, PND or edema. He is usually quite active and denies any decreased exercise tolerance or exercise induced palpitations.  Labs (10/13):  K 3.6, Cr  0.80, ALT 16, LDL 106, Hgb 15.4, TSH 3.28  Wt Readings from Last 3 Encounters:  07/03/13 170 lb (77.111 kg)  08/18/12 171 lb (77.565 kg)  08/17/12 169 lb 4 oz (76.771 kg)     Past Medical History  Diagnosis Date  . Depression   . Diverticulosis     History of diverticulosis// flex sig 2006//colonoscopy 01/21/2000 diverticulosis    Current Outpatient Prescriptions  Medication Sig Dispense Refill  . fluorouracil (EFUDEX) 5 % cream       . Multiple Vitamin (MULTIVITAMIN WITH MINERALS) TABS Take 1 tablet by mouth daily.       No current facility-administered medications for this visit.    Allergies:   No Known Allergies  Social History:  The patient  reports that he has never smoked. He does not have any smokeless tobacco history on file. He reports that he drinks about 0.5 ounces of alcohol per week. He reports that he does not use illicit drugs.   Family History:  [name] family history includes Arthritis in his mother; Cancer in his maternal aunt and maternal grandmother; Depression in his sister; Heart disease in his maternal grandfather, maternal grandmother, and mother. There is no history of Sudden death.   ROS:  Please see the history of present illness.    All other systems reviewed and negative.   PHYSICAL EXAM: VS:  BP 122/77  Pulse 58  Ht 6' (1.829 m)  Wt 170 lb (77.111 kg)  BMI 23.05 kg/m2  Filed Vitals:   07/03/13 1254 07/03/13 1255 07/03/13 1256 07/03/13 1257  BP: 133/71 113/78 116/75 112/75  Pulse: 63 72 68 71  Height:      Weight:         Well nourished, well developed, in no acute distress HEENT: normal Neck: no JVD Vascular:  No carotid bruits Cardiac:  normal S1, S2; RRR; no murmur Lungs:  clear to auscultation bilaterally, no wheezing, rhonchi or rales Abd: soft, nontender, no hepatomegaly Ext: no edema Skin: warm and dry Neuro:  CNs 2-12 intact, no focal abnormalities noted  EKG:  Sinus brady, HR 58, iRBBB, no change from prior tracing      ASSESSMENT AND PLAN:  1. 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  He was likely overly tired and dehydrated given his description of events.  I will obtain a BMET, CBC.  Also get Echo to assess for structural heart disease.  Proceed with an event monitor to rule out arrhythmia.  Symptoms are infrequent.  Therefore, a Holter monitor would not be adequate to evaluate his symptoms.  In addition, I reviewed with EP today.  Given high likelihood that this was postural syncope, I have recommended he refrain from driving for 6 weeks.  He should continue to limit caffeine and ETOH intake as he is doing.  Also, continue to stay hydrated.  2. Disposition:  F/u with Dr. Charlton Haws in 4 weeks.   Signed, Tereso Newcomer, PA-C  07/03/2013 12:24 PM

## 2013-07-03 NOTE — Patient Instructions (Addendum)
You should not drive for 6 weeks.    Labs today: BMET, CBC  Your physician has requested that you have an echocardiogram. Echocardiography is a painless test that uses sound waves to create images of your heart. It provides your doctor with information about the size and shape of your heart and how well your heart's chambers and valves are working. This procedure takes approximately one hour. There are no restrictions for this procedure.  Your physician has recommended that you wear a holter monitor. Holter monitors are medical devices that record the heart's electrical activity. Doctors most often use these monitors to diagnose arrhythmias. Arrhythmias are problems with the speed or rhythm of the heartbeat. The monitor is a small, portable device. You can wear one while you do your normal daily activities. This is usually used to diagnose what is causing palpitations/syncope (passing out).  Your physician recommends that you schedule a follow-up appointment in: 4 weeks with Dr.Nishan

## 2013-07-04 ENCOUNTER — Encounter (INDEPENDENT_AMBULATORY_CARE_PROVIDER_SITE_OTHER): Payer: Managed Care, Other (non HMO)

## 2013-07-04 ENCOUNTER — Encounter: Payer: Self-pay | Admitting: *Deleted

## 2013-07-04 ENCOUNTER — Telehealth: Payer: Self-pay | Admitting: *Deleted

## 2013-07-04 DIAGNOSIS — R002 Palpitations: Secondary | ICD-10-CM

## 2013-07-04 DIAGNOSIS — R55 Syncope and collapse: Secondary | ICD-10-CM

## 2013-07-04 NOTE — Telephone Encounter (Signed)
lmom labs good, no changes to be made 

## 2013-07-04 NOTE — Progress Notes (Signed)
Patient ID: Jacob Marsh, male   DOB: 1951/06/08, 62 y.o.   MRN: [id number] E-Cardio verite 30 day cardiac event monitor applied to patient.

## 2013-07-17 ENCOUNTER — Ambulatory Visit (HOSPITAL_COMMUNITY): Payer: Managed Care, Other (non HMO) | Attending: Physician Assistant

## 2013-07-17 DIAGNOSIS — I059 Rheumatic mitral valve disease, unspecified: Secondary | ICD-10-CM | POA: Insufficient documentation

## 2013-07-17 DIAGNOSIS — R002 Palpitations: Secondary | ICD-10-CM

## 2013-07-17 DIAGNOSIS — R55 Syncope and collapse: Secondary | ICD-10-CM | POA: Insufficient documentation

## 2013-07-17 NOTE — Progress Notes (Signed)
Echocardiogram performed.  

## 2013-07-18 ENCOUNTER — Encounter: Payer: Self-pay | Admitting: Physician Assistant

## 2013-08-10 ENCOUNTER — Encounter: Payer: Self-pay | Admitting: Cardiovascular Disease

## 2013-08-10 ENCOUNTER — Ambulatory Visit (INDEPENDENT_AMBULATORY_CARE_PROVIDER_SITE_OTHER): Payer: Managed Care, Other (non HMO) | Admitting: Cardiovascular Disease

## 2013-08-10 VITALS — BP 118/70 | HR 74 | Ht 72.0 in | Wt 171.0 lb

## 2013-08-10 DIAGNOSIS — Z87898 Personal history of other specified conditions: Secondary | ICD-10-CM

## 2013-08-10 DIAGNOSIS — R002 Palpitations: Secondary | ICD-10-CM

## 2013-08-10 HISTORY — DX: Palpitations: R00.2

## 2013-08-10 NOTE — Assessment & Plan Note (Signed)
Normal ETT 2013 Not recurrent Observe

## 2013-08-10 NOTE — Patient Instructions (Signed)
Your physician wants you to follow-up in:  6 MONTHS WITH DR NISHAN  You will receive a reminder letter in the mail two months in advance. If you don't receive a letter, please call our office to schedule the follow-up appointment. Your physician recommends that you continue on your current medications as directed. Please refer to the Current Medication list given to you today. 

## 2013-08-10 NOTE — Assessment & Plan Note (Signed)
Improved Benign echo and monitor Likely related to sleep habits and stress Observe

## 2013-08-10 NOTE — Progress Notes (Signed)
Patient ID: Jacob Marsh, male   DOB: 14-Feb-1951, 62 y.o.   MRN: [id number] Jacob Marsh is a 62 y.o. male who returns for evaluation of syncope.  He was evaluated by me  07/2012 for chest pain. ETT was performed 09/06/12 and demonstrated no ischemia. Patient exercised for 12 minutes.  He is still struggling to recover financially from the 2008 recession. He has had increased emotional stress over the last several weeks. His wife has been dx with dementia. He has not been sleeping very well. He's been drinking excessive amounts of caffeine (6-8 cups per day or more). He typically drinks about one beer per day. Recently, he has been drinking 2 beers a day. He recently had noted some orthostatic intolerance with lightheadedness upon standing. Three weeks ago, he stood up to go to the kitchen and felt lightheaded and dizzy. Prior to standing, he felt as though his heart rate may be elevated. He try to take his pulse but was not certain if it was irregular. He states that "something did not feel right." He tried to catch himself and fell to the floor on his buttocks. He denies any injury. He was likely out for seconds. He denies any confusion upon awakening. There was no seizure-like activity reported. He denies any loss of bowel or bladder function. Since this episode, he has cut way back on his caffeine intake. He has also resumed his one beer per day regimen. He feels much better. He continues to have occasional, very brief lightheadedness with standing. No further syncope. Otherwise, he denies chest pain, shortness of breath, orthopnea, PND or edema. He is usually quite active and denies any decreased exercise tolerance or exercise induced palpitations.  F/u event monitor reviewed shows benign PAC Echo 9/22/ reveiwed and essentially normal trivial AR   ROS: Denies fever, malais, weight loss, blurry vision, decreased visual acuity, cough, sputum, SOB, hemoptysis, pleuritic pain,  palpitaitons, heartburn, abdominal pain, melena, lower extremity edema, claudication, or rash.  All other systems reviewed and negative  General: Affect appropriate Healthy:  appears stated age HEENT: normal Neck supple with no adenopathy JVP normal no bruits no thyromegaly Lungs clear with no wheezing and good diaphragmatic motion Heart:  S1/S2 no murmur, no rub, gallop or click PMI normal Abdomen: benighn, BS positve, no tenderness, no AAA no bruit.  No HSM or HJR Distal pulses intact with no bruits No edema Neuro non-focal Skin warm and dry No muscular weakness   Current Outpatient Prescriptions  Medication Sig Dispense Refill  . fluorouracil (EFUDEX) 5 % cream       . Multiple Vitamin (MULTIVITAMIN WITH MINERALS) TABS Take 1 tablet by mouth daily.       No current facility-administered medications for this visit.    Allergies  Review of patient's allergies indicates no known allergies.  Electrocardiogram:  Assessment and Plan

## 2013-10-11 ENCOUNTER — Telehealth: Payer: Self-pay | Admitting: *Deleted

## 2013-10-11 NOTE — Telephone Encounter (Addendum)
Received fax from pt's insurance with a recent ECG results, Dr. Milinda Antis advise me to have the pt f/u with her to repeat this EKG because the copy his insurance faxed [place] is difficult to read.  Left voicemail requesting pt to call office

## 2013-10-12 NOTE — Telephone Encounter (Signed)
Pt advised we received a copy of his ECG from his insurance. Pt declined to schedule a f/u because he wants to f/u with his cardiologist Dr. Eden Emms, I faxed over a copy of his ECG to his cardiology but I did advise them that it's a bad copy of the ECG so they may not be able to read it

## 2013-10-12 NOTE — Telephone Encounter (Signed)
That is fine  thanks

## 2013-10-12 NOTE — Telephone Encounter (Deleted)
Pt adi

## 2014-03-20 ENCOUNTER — Ambulatory Visit: Payer: Managed Care, Other (non HMO) | Admitting: Cardiovascular Disease

## 2014-03-20 ENCOUNTER — Ambulatory Visit (INDEPENDENT_AMBULATORY_CARE_PROVIDER_SITE_OTHER): Payer: Managed Care, Other (non HMO) | Admitting: Cardiovascular Disease

## 2014-03-20 ENCOUNTER — Encounter: Payer: Self-pay | Admitting: Cardiovascular Disease

## 2014-03-20 VITALS — BP 113/67 | HR 76 | Ht 72.0 in | Wt 162.8 lb

## 2014-03-20 DIAGNOSIS — R42 Dizziness and giddiness: Secondary | ICD-10-CM

## 2014-03-20 DIAGNOSIS — I493 Ventricular premature depolarization: Secondary | ICD-10-CM | POA: Insufficient documentation

## 2014-03-20 DIAGNOSIS — I491 Atrial premature depolarization: Secondary | ICD-10-CM

## 2014-03-20 HISTORY — DX: Dizziness and giddiness: R42

## 2014-03-20 HISTORY — DX: Ventricular premature depolarization: I49.3

## 2014-03-20 NOTE — Patient Instructions (Signed)
Your physician wants you to follow-up in:   Jacob Marsh will receive a reminder letter in the mail two months in advance. If you don't receive a letter, please call our office to schedule the follow-up appointment. Your physician recommends that you continue on your current medications as directed. Please refer to the Current Medication list given to you today.  Your physician has recommended that you wear a holter monitor. Holter monitors are medical devices that record the heart's electrical activity. Doctors most often use these monitors to diagnose arrhythmias. Arrhythmias are problems with the speed or rhythm of the heartbeat. The monitor is a small, portable device. You can wear one while you do your normal daily activities. This is usually used to diagnose what is causing palpitations/syncope (passing out).

## 2014-03-20 NOTE — Progress Notes (Signed)
Patient ID: Jacob Marsh, male   DOB: [birth year], 62 y.o.   MRN: [id number] Jacob Marsh is a 63 y.o. male who returns for evaluation of syncope.  He was evaluated by me 07/2012 for chest pain. ETT was performed 09/06/12 and demonstrated no ischemia. Patient exercised for 12 minutes.  He is still struggling to recover financially from the 2008 recession. He has had increased emotional stress over the last several weeks. His wife has been dx with dementia. He has not been sleeping very well. He's been drinking excessive amounts of caffeine (6-8 cups per day or more). He typically drinks about one beer per day. Recently, he has been drinking 2 beers a day. He recently had noted some orthostatic intolerance with lightheadedness upon standing. Three weeks ago, he stood up to go to the kitchen and felt lightheaded and dizzy. Prior to standing, he felt as though his heart rate may be elevated. He try to take his pulse but was not certain if it was irregular. He states that "something did not feel right." He tried to catch himself and fell to the floor on his buttocks. He denies any injury. He was likely out for seconds. He denies any confusion upon awakening. There was no seizure-like activity reported. He denies any loss of bowel or bladder function. Since this episode, he has cut way back on his caffeine intake. He has also resumed his one beer per day regimen. He feels much better. He continues to have occasional, very brief lightheadedness with standing. No further syncope. Otherwise, he denies chest pain, shortness of breath, orthopnea, PND or edema. He is usually quite active and denies any decreased exercise tolerance or exercise induced palpitations.   F/u event monitor reviewed shows benign PAC  Echo 9/22/ reveiwed and essentially normal trivial AR  Still with mild postural symptoms  No other signs of dysautonomia.  Insurance company refusing coverage due to skipped heart beats presumed  PAC;s and pre syncope  A year ago.   ROS: Denies fever, malais, weight loss, blurry vision, decreased visual acuity, cough, sputum, SOB, hemoptysis, pleuritic pain, palpitaitons, heartburn, abdominal pain, melena, lower extremity edema, claudication, or rash.  All other systems reviewed and negative  General:  Not postural with systolic BP 151 mmHg laying and standing  Affect appropriate Healthy:  appears stated age 39: normal Neck supple with no adenopathy JVP normal no bruits no thyromegaly Lungs clear with no wheezing and good diaphragmatic motion Heart:  S1/S2 no murmur, no rub, gallop or click PMI normal Abdomen: benighn, BS positve, no tenderness, no AAA no bruit.  No HSM or HJR Distal pulses intact with no bruits No edema Neuro non-focal Skin warm and dry No muscular weakness   Current Outpatient Prescriptions  Medication Sig Dispense Refill  . Multiple Vitamin (MULTIVITAMIN WITH MINERALS) TABS Take 1 tablet by mouth daily.       No current facility-administered medications for this visit.    Allergies  Review of patient's allergies indicates no known allergies.  Electrocardiogram:  Assessment and Plan

## 2014-03-20 NOTE — Assessment & Plan Note (Signed)
Improved but likely led to presyncope 9 months ago  No other signs of autonomic dysfunction.  Discussed issues of staying hydrated No signs of other structural heart disease and not postural in office today  Improved

## 2014-03-20 NOTE — Assessment & Plan Note (Signed)
Benign and asymptomatic  He has had negative ETT 2013 and essentially normal echo and benign event monitor 2014.  Do not think he has any structural heart disease that would warrant insurance exceptions Will order 24 hr holter to quant itate any skipped beats and happy to write letter to insurance company

## 2014-03-27 ENCOUNTER — Encounter (INDEPENDENT_AMBULATORY_CARE_PROVIDER_SITE_OTHER): Payer: Managed Care, Other (non HMO)

## 2014-03-27 ENCOUNTER — Encounter: Payer: Self-pay | Admitting: *Deleted

## 2014-03-27 DIAGNOSIS — I491 Atrial premature depolarization: Secondary | ICD-10-CM

## 2014-03-27 NOTE — Progress Notes (Signed)
Patient ID: Jacob Marsh, male   DOB: 1950/11/26, 63 y.o.   MRN: [id number] E-cardio 24 hour holter monitor applied to patient.

## 2014-04-04 ENCOUNTER — Telehealth: Payer: Self-pay | Admitting: *Deleted

## 2014-04-04 ENCOUNTER — Telehealth: Payer: Self-pay | Admitting: Cardiovascular Disease

## 2014-04-04 DIAGNOSIS — I472 Ventricular tachycardia: Secondary | ICD-10-CM

## 2014-04-04 DIAGNOSIS — I4729 Other ventricular tachycardia: Secondary | ICD-10-CM

## 2014-04-04 NOTE — Telephone Encounter (Signed)
Patient is returning your call please call back

## 2014-04-04 NOTE — Telephone Encounter (Signed)
LEFT MESSAGE RE  MONITOR  RESULTS  PER  DR NISHAN   LOTS OF  ECOTOPY  AND  NSVT  NEEDS  EP  CONSULT,  ALSO   CARDIAC  MRI  R/O SCAR OR  RV    DYSPLASIA  ,AND  STRESS  MYOVIEW .Adonis Housekeeper

## 2014-04-05 ENCOUNTER — Telehealth: Payer: Self-pay | Admitting: Cardiovascular Disease

## 2014-04-05 NOTE — Telephone Encounter (Signed)
PT  AWARE OF  MONITOR RESULTS ./CY 

## 2014-04-05 NOTE — Telephone Encounter (Signed)
SEE OTHER PHONE NOTE./CY 

## 2014-04-05 NOTE — Telephone Encounter (Signed)
NEW PROBLEM     The cardi MRI orders need to be faxed to [facility] # [phone]. Auth # [id number]        Please call [facility] to confirm they received it [phone]

## 2014-04-10 NOTE — Telephone Encounter (Signed)
OKAY  TO  HAVE  DONE AT  [facility] PER  DR NISHAN ATTEMPTED TO  FAX ORDER  AS  REQUESTED./CY

## 2014-04-10 NOTE — Telephone Encounter (Signed)
FAXED TO  [phone] NOT OTHER NUMBER .Adonis Housekeeper

## 2014-04-11 ENCOUNTER — Encounter (HOSPITAL_COMMUNITY): Payer: Managed Care, Other (non HMO)

## 2014-04-12 ENCOUNTER — Ambulatory Visit (HOSPITAL_COMMUNITY): Payer: Managed Care, Other (non HMO) | Attending: Cardiology | Admitting: Radiology

## 2014-04-12 VITALS — BP 117/82 | HR 59 | Ht 72.0 in | Wt 156.0 lb

## 2014-04-12 DIAGNOSIS — R55 Syncope and collapse: Secondary | ICD-10-CM | POA: Insufficient documentation

## 2014-04-12 DIAGNOSIS — I4729 Other ventricular tachycardia: Secondary | ICD-10-CM

## 2014-04-12 DIAGNOSIS — I472 Ventricular tachycardia: Secondary | ICD-10-CM

## 2014-04-12 DIAGNOSIS — R42 Dizziness and giddiness: Secondary | ICD-10-CM | POA: Insufficient documentation

## 2014-04-12 DIAGNOSIS — R002 Palpitations: Secondary | ICD-10-CM | POA: Insufficient documentation

## 2014-04-12 DIAGNOSIS — Z8249 Family history of ischemic heart disease and other diseases of the circulatory system: Secondary | ICD-10-CM | POA: Insufficient documentation

## 2014-04-12 MED ORDER — TECHNETIUM TC 99M SESTAMIBI GENERIC - CARDIOLITE
30.0000 | Freq: Once | INTRAVENOUS | Status: AC | PRN
  Administered 2014-04-12: 30 via INTRAVENOUS

## 2014-04-12 MED ORDER — TECHNETIUM TC 99M SESTAMIBI GENERIC - CARDIOLITE
10.0000 | Freq: Once | INTRAVENOUS | Status: AC | PRN
  Administered 2014-04-12: 10 via INTRAVENOUS

## 2014-04-12 NOTE — Progress Notes (Signed)
[place] [place] [place], [place] [address] [phone]    Cardiology Nuclear Med Study  Jacob Marsh is a 63 y.o. male     MRN : [id number]     DOB: [birth year]  Procedure Date: 04/12/2014  Nuclear Med Background Indication for Stress Test:  Evaluation for Ischemia History:  No known CAD, NSVT, ETT 11/13 (negative), Echo 2014 EF 55% Cardiac Risk Factors: Family History - CAD and Emotional stress  Symptoms:  Dizziness, Palpitations and Syncope   Nuclear Pre-Procedure Caffeine/Decaff Intake:  None> 12 hrs NPO After: 8:00pm   Lungs:  clear O2 Sat: 98% on room air. IV 0.9% NS with Angio Cath:  22g  IV Site: R Antecubital x 1, tolerated well IV Started by:  Irven Baltimore, RN  Chest Size (in):  40 Cup Size: n/a  Height: 6' (1.829 m)  Weight:  156 lb (70.761 kg)  BMI:  Body mass index is 21.15 kg/(m^2). Tech Comments:  N/A    Nuclear Med Study 1 or 2 day study: 1 day  Stress Test Type:  Stress  Reading MD: N/A  Order Authorizing Provider:  Jenkins Rouge, MD  Resting Radionuclide: Technetium 64m Sestamibi  Resting Radionuclide Dose: 11.0 mCi   Stress Radionuclide:  Technetium 96m Sestamibi  Stress Radionuclide Dose: 33.0 mCi           Stress Protocol Rest HR: 59 Stress HR: 137  Rest BP: 117/82 Stress BP: 159/72  Exercise Time (min): 12:00 METS: 13.4           Dose of Adenosine (mg):  n/a Dose of Lexiscan: n/a mg  Dose of Atropine (mg): n/a Dose of Dobutamine: n/a mcg/kg/min (at max HR)  Stress Test Technologist: Glade Lloyd, BS-ES  Nuclear Technologist:  Charlton Amor, CNMT     Rest Procedure:  Myocardial perfusion imaging was performed at rest 45 minutes following the intravenous administration of Technetium 11m Sestamibi. Rest ECG: Normal sinus rhythm. Normal EKG  Stress Procedure:  The patient exercised on the treadmill utilizing the Bruce Protocol for 12:00 minutes. The patient stopped due to fatigue and denied  any chest pain.  Technetium 65m Sestamibi was injected at peak exercise and myocardial perfusion imaging was performed after a brief delay. Stress ECG: No change in the QRS was stressed. There are scattered PVCs throughout the study. There is no ventricular tachycardia.  QPS Raw Data Images:  There is significant motion on the rest images. However there is no significant motion with the stress images. Stress Images:  Normal homogeneous uptake in all areas of the myocardium. Rest Images:  There is excess motion on the rest images. Subtraction (SDS):  No significant reversibility. Transient Ischemic Dilatation (Normal <1.22):  1.03 Lung/Heart Ratio (Normal <0.45):  0.30  Quantitative Gated Spect Images QGS EDV:  125 ml QGS ESV:  60 ml  Impression Exercise Capacity:  Good exercise capacity. BP Response:  Normal blood pressure response. Clinical Symptoms:  No significant symptoms noted. ECG Impression:  No significant ST segment change suggestive of ischemia. Comparison with Prior Nuclear Study: No images to compare  Overall Impression:  This is a low-risk scan. There is no scar or ischemia. The stress images are normal. The rest images cannot be interpreted well because of excess motion. However normal stress images are adequate to call this a normal study.  LV Ejection Fraction: 52%.  LV Wall Motion:  Normal Wall Motion  Dola Argyle, MD

## 2014-04-19 ENCOUNTER — Telehealth: Payer: Self-pay | Admitting: Cardiovascular Disease

## 2014-04-19 NOTE — Telephone Encounter (Signed)
PT AWARE OF MYOVIEW RESULTS./CY 

## 2014-04-19 NOTE — Telephone Encounter (Signed)
Patient is returning your call. Please call back.  °

## 2014-04-24 ENCOUNTER — Ambulatory Visit (INDEPENDENT_AMBULATORY_CARE_PROVIDER_SITE_OTHER): Payer: Managed Care, Other (non HMO) | Admitting: Internal Medicine

## 2014-04-24 ENCOUNTER — Encounter: Payer: Self-pay | Admitting: Internal Medicine

## 2014-04-24 VITALS — BP 118/64 | HR 64 | Ht 72.0 in | Wt 159.0 lb

## 2014-04-24 DIAGNOSIS — I472 Ventricular tachycardia: Secondary | ICD-10-CM

## 2014-04-24 DIAGNOSIS — I493 Ventricular premature depolarization: Secondary | ICD-10-CM

## 2014-04-24 DIAGNOSIS — I4729 Other ventricular tachycardia: Secondary | ICD-10-CM

## 2014-04-24 DIAGNOSIS — R55 Syncope and collapse: Secondary | ICD-10-CM | POA: Insufficient documentation

## 2014-04-24 DIAGNOSIS — I4949 Other premature depolarization: Secondary | ICD-10-CM

## 2014-04-24 MED ORDER — METOPROLOL TARTRATE 25 MG PO TABS: 25.0000 mg | ORAL_TABLET | Freq: Two times a day (BID) | ORAL | Status: DC

## 2014-04-24 NOTE — Assessment & Plan Note (Addendum)
Most likely his symptoms represent orthostasis though could be exacerbated by his ventricular arrhythmias. He has had documented NSVT. I have encourage the patient to continue his physical activity. He is encouraged to increase his sodium intake.

## 2014-04-24 NOTE — Assessment & Plan Note (Signed)
He has had over 21000 PVC's in a 24 hour period. He is thought to have normal perfusion and LV function. He is pending cardiac MR. I have recommended initiation of metoprolol 25 mg twice daily. If not controlled would consider flecainide 100 mg twice daily. I will see him back in approx. 3 months.

## 2014-04-24 NOTE — Progress Notes (Signed)
      HPI Jacob Marsh is referred today by Dr. Johnsie Cancel for evaluation of PVC's. He is a very pleasant 63 yo man with a h/o syncope which sounds like orthostasis. Subsequent evaluation demonstrated normal LV function and a perfusion study was also normal. He wore a 24 hour holter which demonstrated over 21000 PVC's in a 24 hour period. The patient has symptoms of his PVC's but these are mild. He has not been treated with any medications at this point. He denies chest pain or sob.  No Known Allergies   Current Outpatient Prescriptions  Medication Sig Dispense Refill  . Multiple Vitamin (MULTIVITAMIN WITH MINERALS) TABS Take 1 tablet by mouth daily.       No current facility-administered medications for this visit.     Past Medical History  Diagnosis Date  . Depression   . Diverticulosis     History of diverticulosis// flex sig 2006//colonoscopy 01/21/2000 diverticulosis  . Hx of echocardiogram     Echo (9/14): EF 81%, grade 1 diastolic dysfunction, trivial AI, trivial MR, MAC, PASP 29    ROS:   All systems reviewed and negative except as noted in the HPI.   Past Surgical History  Procedure Laterality Date  . Tonsillectomy    . Dupuytren contracture release    . Knee arthroscopy w/ meniscal repair       Family History  Problem Relation Age of Onset  . Arthritis Mother   . Heart disease Mother     ? atrial fib  . Depression Sister   . Cancer Maternal Aunt     ?colon cancer  . Heart disease Maternal Grandmother     CAD  . Cancer Maternal Grandmother     liver cancer  . Heart disease Maternal Grandfather     CAD  . Sudden death Neg Hx      History   Social History  . Marital Status: Married    Spouse Name: N/A    Number of Children: N/A  . Years of Education: N/A   Occupational History  . Not on file.   Social History Main Topics  . Smoking status: Never Smoker   . Smokeless tobacco: Not on file  . Alcohol Use: 0.5 oz/week    1 drink(s) per week   Comment: social- 1-2 beer  . Drug Use: No  . Sexual Activity: Yes   Other Topics Concern  . Not on file   Social History Narrative  . No narrative on file     BP 118/64  Pulse 64  Ht 6' (1.829 m)  Wt 159 lb (72.122 kg)  BMI 21.56 kg/m2  Physical Exam:  Well appearing middle aged man, NAD HEENT: Unremarkable Neck:  No JVD, no thyromegally Back:  No CVA tenderness Lungs:  Clear with no wheezes HEART:  IRegular rate rhythm, no murmurs, no rubs, no clicks Abd:  soft, positive bowel sounds, no organomegally, no rebound, no guarding Ext:  2 plus pulses, no edema, no cyanosis, no clubbing Skin:  No rashes no nodules Neuro:  CN II through XII intact, motor grossly intact  EKG - nsr with frequent PVCs.    Assess/Plan:

## 2014-04-24 NOTE — Patient Instructions (Signed)
Your physician recommends that you schedule a follow-up appointment in: 10 weeks with Dr Lovena Le  Your physician has recommended you make the following change in your medication:  1) Start Metoprolol 25mg  twice daily

## 2014-05-07 ENCOUNTER — Telehealth: Payer: Self-pay | Admitting: Cardiovascular Disease

## 2014-05-07 NOTE — Telephone Encounter (Signed)
New message     Pt is there for a cardiac MRI and they need an order.

## 2014-05-07 NOTE — Telephone Encounter (Signed)
ORDER  FAXED  TO [facility] .Adonis Housekeeper

## 2014-05-10 ENCOUNTER — Ambulatory Visit (HOSPITAL_COMMUNITY): Admission: RE | Admit: 2014-05-10 | Payer: Managed Care, Other (non HMO) | Source: Ambulatory Visit

## 2014-06-06 ENCOUNTER — Encounter: Payer: Self-pay | Admitting: Cardiovascular Disease

## 2014-07-03 ENCOUNTER — Ambulatory Visit (INDEPENDENT_AMBULATORY_CARE_PROVIDER_SITE_OTHER): Payer: Managed Care, Other (non HMO) | Admitting: Internal Medicine

## 2014-07-03 ENCOUNTER — Other Ambulatory Visit: Payer: Self-pay

## 2014-07-03 ENCOUNTER — Encounter: Payer: Self-pay | Admitting: Internal Medicine

## 2014-07-03 VITALS — BP 88/62 | HR 67 | Ht 72.0 in | Wt 156.6 lb

## 2014-07-03 DIAGNOSIS — I4729 Other ventricular tachycardia: Secondary | ICD-10-CM

## 2014-07-03 DIAGNOSIS — I472 Ventricular tachycardia: Secondary | ICD-10-CM

## 2014-07-03 DIAGNOSIS — I4949 Other premature depolarization: Secondary | ICD-10-CM

## 2014-07-03 DIAGNOSIS — I493 Ventricular premature depolarization: Secondary | ICD-10-CM

## 2014-07-03 MED ORDER — FLECAINIDE ACETATE 100 MG PO TABS: 100.0000 mg | ORAL_TABLET | Freq: Two times a day (BID) | ORAL | Status: DC

## 2014-07-03 NOTE — Progress Notes (Signed)
      HPI Jacob Marsh  returns today for ongoing evaluation of PVC's. He is a very pleasant 63 yo man with a h/o syncope which sounds like orthostasis. Subsequent evaluation demonstrated normal LV function and a perfusion study was also normal. He wore a 24 hour holter which demonstrated over 21000 PVC's in a 24 hour period. The patient has symptoms of his PVC's but these are mild. He has not been treated with any medications at this point. He denies chest pain or sob. He was started on metoprolol but has continued to have symptoms.  No Known Allergies   Current Outpatient Prescriptions  Medication Sig Dispense Refill  . metoprolol tartrate (LOPRESSOR) 25 MG tablet Take 1 tablet (25 mg total) by mouth 2 (two) times daily.  180 tablet  3  . Multiple Vitamin (MULTIVITAMIN WITH MINERALS) TABS Take 1 tablet by mouth daily.       No current facility-administered medications for this visit.     Past Medical History  Diagnosis Date  . Depression   . Diverticulosis     History of diverticulosis// flex sig 2006//colonoscopy 01/21/2000 diverticulosis  . Hx of echocardiogram     Echo (9/14): EF 56%, grade 1 diastolic dysfunction, trivial AI, trivial MR, MAC, PASP 29    ROS:   All systems reviewed and negative except as noted in the HPI.   Past Surgical History  Procedure Laterality Date  . Tonsillectomy    . Dupuytren contracture release    . Knee arthroscopy w/ meniscal repair       Family History  Problem Relation Age of Onset  . Arthritis Mother   . Heart disease Mother     ? atrial fib  . Depression Sister   . Cancer Maternal Aunt     ?colon cancer  . Heart disease Maternal Grandmother     CAD  . Cancer Maternal Grandmother     liver cancer  . Heart disease Maternal Grandfather     CAD  . Sudden death Neg Hx      History   Social History  . Marital Status: Married    Spouse Name: N/A    Number of Children: N/A  . Years of Education: N/A   Occupational History    . Not on file.   Social History Main Topics  . Smoking status: Never Smoker   . Smokeless tobacco: Not on file  . Alcohol Use: 0.5 oz/week    1 drink(s) per week     Comment: social- 1-2 beer  . Drug Use: No  . Sexual Activity: Yes   Other Topics Concern  . Not on file   Social History Narrative  . No narrative on file     BP 88/62  Pulse 67  Ht 6' (1.829 m)  Wt 156 lb 9.6 oz (71.033 kg)  BMI 21.23 kg/m2  Physical Exam:  Well appearing middle aged man, NAD HEENT: Unremarkable Neck:  No JVD, no thyromegally Back:  No CVA tenderness Lungs:  Clear with no wheezes HEART:  IRegular rate rhythm, no murmurs, no rubs, no clicks Abd:  soft, positive bowel sounds, no organomegally, no rebound, no guarding Ext:  2 plus pulses, no edema, no cyanosis, no clubbing Skin:  No rashes no nodules Neuro:  CN II through XII intact, motor grossly intact  EKG - nsr with frequent PVCs.    Assess/Plan:

## 2014-07-03 NOTE — Assessment & Plan Note (Signed)
He remains symptomatic and his beta blocker appears to be unhelpful. I have asked the patient to start flecainide. He will undergo treadmill testing in a few weeks and I will see him back in approximately 3 months.

## 2014-07-03 NOTE — Patient Instructions (Signed)
Your physician wants you to follow-up in: 3 months with Dr Lovena Le   Your physician has recommended you make the following change in your medication:  1) Start Flecainide 100mg  twice daily---take 1/2 tablet twice daily for the first week  Your physician has requested that you have an exercise tolerance test. For further information please visit [email or web address]. Please also follow instruction sheet, as given.

## 2014-07-17 ENCOUNTER — Ambulatory Visit (INDEPENDENT_AMBULATORY_CARE_PROVIDER_SITE_OTHER): Payer: Managed Care, Other (non HMO) | Admitting: Family Medicine

## 2014-07-17 ENCOUNTER — Encounter: Payer: Self-pay | Admitting: Family Medicine

## 2014-07-17 VITALS — BP 96/58 | HR 49 | Temp 98.2°F | Ht 72.0 in | Wt 158.0 lb

## 2014-07-17 DIAGNOSIS — F458 Other somatoform disorders: Secondary | ICD-10-CM

## 2014-07-17 DIAGNOSIS — R09A2 Foreign body sensation, throat: Secondary | ICD-10-CM | POA: Insufficient documentation

## 2014-07-17 DIAGNOSIS — J029 Acute pharyngitis, unspecified: Secondary | ICD-10-CM | POA: Insufficient documentation

## 2014-07-17 DIAGNOSIS — R198 Other specified symptoms and signs involving the digestive system and abdomen: Secondary | ICD-10-CM

## 2014-07-17 DIAGNOSIS — R490 Dysphonia: Secondary | ICD-10-CM | POA: Insufficient documentation

## 2014-07-17 DIAGNOSIS — R0989 Other specified symptoms and signs involving the circulatory and respiratory systems: Secondary | ICD-10-CM

## 2014-07-17 HISTORY — DX: Other specified symptoms and signs involving the digestive system and abdomen: R19.8

## 2014-07-17 HISTORY — DX: Foreign body sensation, throat: R09.A2

## 2014-07-17 HISTORY — DX: Other specified symptoms and signs involving the circulatory and respiratory systems: R09.89

## 2014-07-17 LAB — POCT RAPID STREP A (OFFICE): Rapid Strep A Screen: NEGATIVE

## 2014-07-17 MED ORDER — RANITIDINE HCL 150 MG PO CAPS: 150.0000 mg | ORAL_CAPSULE | Freq: Two times a day (BID) | ORAL | Status: DC

## 2014-07-17 NOTE — Progress Notes (Signed)
Subjective:    Patient ID: Jacob Marsh, male    DOB: [birth year], 63 y.o.   MRN: [id number]  HPI Here with hoarseness and mildly sore throat  Rapid strep test today is normal   Feels like it may be post nasal drip  When he is hoarse he clears throat a lot  Feels phlegm in his throat - does not look at the color   Most soreness is on the L  No fever or constitutional symptoms  Tries to care for himself   Not congested occ sneezing   Has not tried any otc medicines   No heartburn at all   Results for orders placed in visit on 07/17/14  POCT RAPID STREP A (OFFICE)      Result Value Ref Range   Rapid Strep A Screen Negative  Negative     Patient Active Problem List   Diagnosis Date Noted  . Syncope 04/24/2014  . PVCs (premature ventricular contractions) 03/20/2014  . Postural dizziness 03/20/2014  . Palpitations 08/10/2013  . History of chest pain 08/17/2012  . Routine general medical examination at a health care facility 08/09/2012  . Prostate cancer screening 08/09/2012   Past Medical History  Diagnosis Date  . Depression   . Diverticulosis     History of diverticulosis// flex sig 2006//colonoscopy 01/21/2000 diverticulosis  . Hx of echocardiogram     Echo (9/14): EF 09%, grade 1 diastolic dysfunction, trivial AI, trivial MR, MAC, PASP 29   Past Surgical History  Procedure Laterality Date  . Tonsillectomy    . Dupuytren contracture release    . Knee arthroscopy w/ meniscal repair     History  Substance Use Topics  . Smoking status: Never Smoker   . Smokeless tobacco: Not on file  . Alcohol Use: 0.5 oz/week    1 drink(s) per week     Comment: social- 1-2 beer   Family History  Problem Relation Age of Onset  . Arthritis Mother   . Heart disease Mother     ? atrial fib  . Depression Sister   . Cancer Maternal Aunt     ?colon cancer  . Heart disease Maternal Grandmother     CAD  . Cancer Maternal Grandmother     liver cancer  . Heart  disease Maternal Grandfather     CAD  . Sudden death Neg Hx    No Known Allergies Current Outpatient Prescriptions on File Prior to Visit  Medication Sig Dispense Refill  . flecainide (TAMBOCOR) 100 MG tablet Take 1 tablet (100 mg total) by mouth 2 (two) times daily.  180 tablet  3  . metoprolol tartrate (LOPRESSOR) 25 MG tablet Take 1 tablet (25 mg total) by mouth 2 (two) times daily.  180 tablet  3  . Multiple Vitamin (MULTIVITAMIN WITH MINERALS) TABS Take 1 tablet by mouth daily.       No current facility-administered medications on file prior to visit.     Review of Systems Review of Systems  Constitutional: Negative for fever, appetite change,  and unexpected weight change.  ENT pos for ST (mild) and globus sensation , neg for nasal cong or sinus pain  Eyes: Negative for pain and visual disturbance.  Respiratory: Negative for cough and shortness of breath.   Cardiovascular: Negative for cp or palpitations    Gastrointestinal: Negative for nausea, diarrhea and constipation.  Genitourinary: Negative for urgency and frequency.  Skin: Negative for pallor or rash   Neurological: Negative for weakness,  light-headedness, numbness and headaches.  Hematological: Negative for adenopathy. Does not bruise/bleed easily.  Psychiatric/Behavioral: Negative for dysphoric mood. The patient is not nervous/anxious.         Objective:   Physical Exam  Constitutional: He appears well-developed and well-nourished. No distress.  HENT:  Head: Normocephalic and atraumatic.  Right Ear: External ear normal.  Left Ear: External ear normal.  Mouth/Throat: Oropharynx is clear and moist. No oropharyngeal exudate.  Nares are slt boggy No sinus tenderness  Throat is entirely clear -no erythema or swelling or exudate   Eyes: Conjunctivae and EOM are normal. Pupils are equal, round, and reactive to light. Right eye exhibits no discharge. Left eye exhibits no discharge.  Neck: Normal range of motion. Neck  supple. No JVD present. No tracheal deviation present. No thyromegaly present.  Cardiovascular: Regular rhythm and normal heart sounds.   bradycardia  Pulmonary/Chest: Effort normal and breath sounds normal. No respiratory distress. He has no wheezes. He has no rales.  Abdominal: Soft. Bowel sounds are normal. He exhibits no distension. There is no tenderness.  Lymphadenopathy:    He has no cervical adenopathy.  Neurological: He is alert.  Skin: Skin is warm and dry. No rash noted. No erythema. No pallor.  Psychiatric: He has a normal mood and affect.          Assessment & Plan:   Problem List Items Addressed This Visit     Other   Hoarse     With mild ST and throat clearing  Suspect post nasal drip vs gerd  Will cover with antihistamine daily (zyrtec or allegra)  Also zantac 150 bid  Update if not resolved in 2 weeks-consider ENT eval  Will call if side eff or new symptoms     Sore throat - Primary     See above Neg RST ? gerd or post nasal drip     Relevant Orders      Rapid Strep A (Completed)   Globus sensation     In throat with hoarseness and a bit of soreness  Suspect post nasal drip vs gerd  See a and p for hoarseness

## 2014-07-17 NOTE — Assessment & Plan Note (Signed)
With mild ST and throat clearing  Suspect post nasal drip vs gerd  Will cover with antihistamine daily (zyrtec or allegra)  Also zantac 150 bid  Update if not resolved in 2 weeks-consider ENT eval  Will call if side eff or new symptoms

## 2014-07-17 NOTE — Assessment & Plan Note (Signed)
In throat with hoarseness and a bit of soreness  Suspect post nasal drip vs gerd  See a and p for hoarseness

## 2014-07-17 NOTE — Assessment & Plan Note (Signed)
See above Neg RST ? gerd or post nasal drip

## 2014-07-17 NOTE — Patient Instructions (Signed)
Get zyrtec 10 mg or allegra 180 mg over the counter (store brand is fine)- take as directed  Zantac 150 mg twice daily (ranitidine) - to control reflux and this also helps allergies  Gargle with salt water if needed Update me in 2 weeks if not gone   If not improved I will refer you to ENT

## 2014-07-17 NOTE — Progress Notes (Signed)
Pre visit review using our clinic review tool, if applicable. No additional management support is needed unless otherwise documented below in the visit note. 

## 2014-08-06 ENCOUNTER — Encounter: Payer: Managed Care, Other (non HMO) | Admitting: Physician Assistant

## 2014-08-08 ENCOUNTER — Encounter: Payer: Self-pay | Admitting: Family Medicine

## 2014-08-09 ENCOUNTER — Telehealth: Payer: Self-pay | Admitting: Family Medicine

## 2014-08-09 DIAGNOSIS — R09A2 Foreign body sensation, throat: Secondary | ICD-10-CM

## 2014-08-09 DIAGNOSIS — R198 Other specified symptoms and signs involving the digestive system and abdomen: Secondary | ICD-10-CM

## 2014-08-09 DIAGNOSIS — R490 Dysphonia: Secondary | ICD-10-CM

## 2014-08-09 DIAGNOSIS — R0989 Other specified symptoms and signs involving the circulatory and respiratory systems: Secondary | ICD-10-CM

## 2014-08-09 NOTE — Telephone Encounter (Signed)
Ref to ENT for throat

## 2014-09-04 ENCOUNTER — Encounter: Payer: Self-pay | Admitting: Nurse Practitioner

## 2014-09-04 ENCOUNTER — Ambulatory Visit (INDEPENDENT_AMBULATORY_CARE_PROVIDER_SITE_OTHER): Payer: Managed Care, Other (non HMO) | Admitting: Nurse Practitioner

## 2014-09-04 DIAGNOSIS — I472 Ventricular tachycardia: Secondary | ICD-10-CM

## 2014-09-04 DIAGNOSIS — I4729 Other ventricular tachycardia: Secondary | ICD-10-CM

## 2014-09-04 DIAGNOSIS — I493 Ventricular premature depolarization: Secondary | ICD-10-CM

## 2014-09-04 NOTE — Progress Notes (Signed)
Exercise Treadmill Test  Pre-Exercise Testing Evaluation Rhythm: normal sinus  Rate: 58 bpm     Test  Exercise Tolerance Test Ordering MD: Cristopher Peru, MD  Interpreting MD: Truitt Merle, NP  Unique Test No: 1  Treadmill:  1  Indication for ETT: PVC  Contraindication to ETT: No   Stress Modality: exercise - treadmill  Cardiac Imaging Performed: non   Protocol: standard Bruce - maximal  Max BP:  142/59  Max MPHR (bpm):  155 85% MPR (bpm):  132  MPHR obtained (bpm):  105 % MPHR obtained:  65%  Reached 85% MPHR (min:sec):  NA Total Exercise Time (min-sec):  11 minutes  Workload in METS:  13.3 Borg Scale: 15  Reason ETT Terminated:  patient's desire to stop    ST Segment Analysis At Rest: normal ST segments - no evidence of significant ST depression With Exercise: no evidence of significant ST depression  Other Information Arrhythmia:  No Angina during ETT:  absent (0) Quality of ETT:  diagnostic  ETT Interpretation:  normal - no evidence of ischemia by ST analysis  Comments: Patient presents today for routine GXT. Has had PVCs - now on Flecainide - GXT to rule out proarrhythmia.  He is feeling better clinically. His metoprolol was not held for today's study.  Today the patient exercised on the standard Bruce protocol for a total of 11 minutes.  Good exercise tolerance.  Adequate blood pressure response.  Clinically negative for chest pain. Test was stopped due to patient's desire to stop.  EKG negative for ischemia. No significant arrhythmia noted. One couplet noted in recovery. Rare PVC noted with exercise.    Recommendations: Continue with current regimen.  See Dr. Lovena Le back as planned.  Patient is agreeable to this plan and will call if any problems develop in the interim.   Burtis Junes, RN, [place] [address] [place], [place]  [phone]

## 2014-10-02 ENCOUNTER — Ambulatory Visit: Payer: Managed Care, Other (non HMO) | Admitting: Internal Medicine

## 2014-10-03 ENCOUNTER — Ambulatory Visit: Payer: Managed Care, Other (non HMO) | Admitting: Internal Medicine

## 2014-11-07 ENCOUNTER — Ambulatory Visit (INDEPENDENT_AMBULATORY_CARE_PROVIDER_SITE_OTHER): Payer: Managed Care, Other (non HMO) | Admitting: Internal Medicine

## 2014-11-07 ENCOUNTER — Encounter: Payer: Self-pay | Admitting: Internal Medicine

## 2014-11-07 VITALS — BP 92/60 | HR 48 | Ht 72.0 in | Wt 163.4 lb

## 2014-11-07 DIAGNOSIS — R002 Palpitations: Secondary | ICD-10-CM

## 2014-11-07 DIAGNOSIS — Z87898 Personal history of other specified conditions: Secondary | ICD-10-CM

## 2014-11-07 DIAGNOSIS — R55 Syncope and collapse: Secondary | ICD-10-CM

## 2014-11-07 DIAGNOSIS — I493 Ventricular premature depolarization: Secondary | ICD-10-CM

## 2014-11-07 MED ORDER — METOPROLOL TARTRATE 25 MG PO TABS: 12.5000 mg | ORAL_TABLET | Freq: Two times a day (BID) | ORAL | Status: DC

## 2014-11-07 NOTE — Assessment & Plan Note (Signed)
His symptoms are nearly resolved. He will continue his current dose of flecainide. I will consider downtitration when I see him back in several months.

## 2014-11-07 NOTE — Patient Instructions (Signed)
Your physician recommends that you schedule a follow-up appointment in 4 months with Dr. Lovena Le   Your physician has recommended you make the following change in your medication:  1) DECREASE Metoprolol to 12.5 mg (0.5 tablet) twice a day

## 2014-11-07 NOTE — Assessment & Plan Note (Signed)
He has had no recurrent symptoms. When he underwent exercise testing, there was no evidence of ischemia.

## 2014-11-07 NOTE — Assessment & Plan Note (Signed)
He has not passed out but does have some episodes of lightheadedness, especially when he gets up quickly. I have asked the patient to reduce his dose of metoprolol to 12.5 mg twice daily. If his symptoms persist, we might trying stopping his beta blocker altogether.

## 2014-11-07 NOTE — Assessment & Plan Note (Signed)
His symptoms have essentially resolved. At this point I have asked that he continue his flecainide. His QRS duration is at the upper limits of acceptable. Will follow.

## 2014-11-07 NOTE — Progress Notes (Signed)
HPI Jacob Marsh  returns today for ongoing evaluation of PVC's. He is a very pleasant 64 yo man with a h/o syncope which sounds like orthostasis. Subsequent evaluation demonstrated normal LV function and a perfusion study was also normal. He wore a 24 hour holter which demonstrated over 21000 PVC's in a 24 hour period. I saw the patient several months ago and we discussed the various treatment options. He was begun on flecainide 100 mg twice daily. He has had almost no palpitations. He does note some dizziness when he stands up quickly. No syncope or other symptoms. No Known Allergies   Current Outpatient Prescriptions  Medication Sig Dispense Refill  . flecainide (TAMBOCOR) 100 MG tablet Take 1 tablet (100 mg total) by mouth 2 (two) times daily. 180 tablet 3  . metoprolol tartrate (LOPRESSOR) 25 MG tablet Take 1 tablet (25 mg total) by mouth 2 (two) times daily. 180 tablet 3  . Multiple Vitamin (MULTIVITAMIN WITH MINERALS) TABS Take 1 tablet by mouth daily.    . ranitidine (ZANTAC) 150 MG capsule Take 1 capsule (150 mg total) by mouth 2 (two) times daily. 60 capsule 3   No current facility-administered medications for this visit.     Past Medical History  Diagnosis Date  . Depression   . Diverticulosis     History of diverticulosis// flex sig 2006//colonoscopy 01/21/2000 diverticulosis  . Hx of echocardiogram     Echo (9/14): EF 82%, grade 1 diastolic dysfunction, trivial AI, trivial MR, MAC, PASP 29    ROS:   All systems reviewed and negative except as noted in the HPI.   Past Surgical History  Procedure Laterality Date  . Tonsillectomy    . Dupuytren contracture release    . Knee arthroscopy w/ meniscal repair       Family History  Problem Relation Age of Onset  . Arthritis Mother   . Heart disease Mother     ? atrial fib  . Depression Sister   . Cancer Maternal Aunt     ?colon cancer  . Heart disease Maternal Grandmother     CAD  . Cancer Maternal  Grandmother     liver cancer  . Heart disease Maternal Grandfather     CAD  . Sudden death Neg Hx      History   Social History  . Marital Status: Married    Spouse Name: N/A    Number of Children: N/A  . Years of Education: N/A   Occupational History  . Not on file.   Social History Main Topics  . Smoking status: Never Smoker   . Smokeless tobacco: Not on file  . Alcohol Use: 0.5 oz/week    1 drink(s) per week     Comment: social- 1-2 beer  . Drug Use: No  . Sexual Activity: Yes   Other Topics Concern  . Not on file   Social History Narrative     BP 92/60 mmHg  Pulse 48  Ht 6' (1.829 m)  Wt 163 lb 6.4 oz (74.118 kg)  BMI 22.16 kg/m2  Physical Exam:  Well appearing middle aged man, NAD HEENT: Unremarkable Neck:  No JVD, no thyromegally Back:  No CVA tenderness Lungs:  Clear with no wheezes HEART:  Regular brady rhythm, no murmurs, no rubs, no clicks Abd:  soft, positive bowel sounds, no organomegally, no rebound, no guarding Ext:  2 plus pulses, no edema, no cyanosis, no clubbing Skin:  No rashes no nodules Neuro:  CN II through XII intact, motor grossly intact  EKG - sinus bradycardia    Assess/Plan:

## 2014-12-03 ENCOUNTER — Telehealth (INDEPENDENT_AMBULATORY_CARE_PROVIDER_SITE_OTHER): Payer: Medicare Other | Admitting: Family Medicine

## 2014-12-03 DIAGNOSIS — Z13220 Encounter for screening for lipoid disorders: Secondary | ICD-10-CM | POA: Insufficient documentation

## 2014-12-03 DIAGNOSIS — Z0000 Encounter for general adult medical examination without abnormal findings: Secondary | ICD-10-CM

## 2014-12-03 DIAGNOSIS — Z125 Encounter for screening for malignant neoplasm of prostate: Secondary | ICD-10-CM

## 2014-12-03 DIAGNOSIS — Z79899 Other long term (current) drug therapy: Secondary | ICD-10-CM

## 2014-12-03 NOTE — Telephone Encounter (Signed)
-----   Message from Ellamae Sia sent at 11/29/2014  4:23 PM EST ----- Regarding: Lab orders for Tuesday, 2.9.16 Patient is scheduled for CPX labs, please order future labs, Thanks , Karna Christmas

## 2014-12-04 ENCOUNTER — Other Ambulatory Visit (INDEPENDENT_AMBULATORY_CARE_PROVIDER_SITE_OTHER): Payer: Medicare Other

## 2014-12-04 DIAGNOSIS — Z13220 Encounter for screening for lipoid disorders: Secondary | ICD-10-CM | POA: Diagnosis not present

## 2014-12-04 LAB — COMPREHENSIVE METABOLIC PANEL WITH GFR
ALT: 16 U/L (ref 0–53)
AST: 17 U/L (ref 0–37)
Albumin: 4.3 g/dL (ref 3.5–5.2)
Alkaline Phosphatase: 45 U/L (ref 39–117)
BUN: 16 mg/dL (ref 6–23)
CO2: 32 meq/L (ref 19–32)
Calcium: 9.8 mg/dL (ref 8.4–10.5)
Chloride: 104 meq/L (ref 96–112)
Creatinine, Ser: 0.9 mg/dL (ref 0.40–1.50)
GFR: 89.94 mL/min
Glucose, Bld: 85 mg/dL (ref 70–99)
Potassium: 4.3 meq/L (ref 3.5–5.1)
Sodium: 140 meq/L (ref 135–145)
Total Bilirubin: 0.8 mg/dL (ref 0.2–1.2)
Total Protein: 6.4 g/dL (ref 6.0–8.3)

## 2014-12-04 LAB — CBC WITH DIFFERENTIAL/PLATELET
Basophils Absolute: 0 10*3/uL (ref 0.0–0.1)
Basophils Relative: 0.3 % (ref 0.0–3.0)
Eosinophils Absolute: 0.1 10*3/uL (ref 0.0–0.7)
Eosinophils Relative: 1.4 % (ref 0.0–5.0)
HCT: 45.1 % (ref 39.0–52.0)
Hemoglobin: 15.4 g/dL (ref 13.0–17.0)
Lymphocytes Relative: 20.5 % (ref 12.0–46.0)
Lymphs Abs: 1.5 10*3/uL (ref 0.7–4.0)
MCHC: 34.2 g/dL (ref 30.0–36.0)
MCV: 94.4 fl (ref 78.0–100.0)
Monocytes Absolute: 0.6 10*3/uL (ref 0.1–1.0)
Monocytes Relative: 9.1 % (ref 3.0–12.0)
Neutro Abs: 4.9 10*3/uL (ref 1.4–7.7)
Neutrophils Relative %: 68.7 % (ref 43.0–77.0)
Platelets: 165 10*3/uL (ref 150.0–400.0)
RBC: 4.78 Mil/uL (ref 4.22–5.81)
RDW: 12.9 % (ref 11.5–15.5)
WBC: 7.1 10*3/uL (ref 4.0–10.5)

## 2014-12-04 LAB — LIPID PANEL
Cholesterol: 162 mg/dL (ref 0–200)
HDL: 47.9 mg/dL
LDL Cholesterol: 97 mg/dL (ref 0–99)
NonHDL: 114.1
Total CHOL/HDL Ratio: 3
Triglycerides: 84 mg/dL (ref 0.0–149.0)
VLDL: 16.8 mg/dL (ref 0.0–40.0)

## 2014-12-04 LAB — PSA: PSA: 1.29 ng/mL (ref 0.10–4.00)

## 2014-12-04 LAB — TSH: TSH: 3.99 u[IU]/mL (ref 0.35–4.50)

## 2014-12-11 ENCOUNTER — Encounter: Payer: Self-pay | Admitting: Family Medicine

## 2014-12-11 ENCOUNTER — Ambulatory Visit (INDEPENDENT_AMBULATORY_CARE_PROVIDER_SITE_OTHER): Payer: Medicare Other | Admitting: Family Medicine

## 2014-12-11 VITALS — BP 94/60 | HR 57 | Temp 97.7°F | Ht 72.0 in | Wt 156.8 lb

## 2014-12-11 DIAGNOSIS — Z0000 Encounter for general adult medical examination without abnormal findings: Secondary | ICD-10-CM | POA: Insufficient documentation

## 2014-12-11 DIAGNOSIS — Z23 Encounter for immunization: Secondary | ICD-10-CM | POA: Diagnosis not present

## 2014-12-11 DIAGNOSIS — R002 Palpitations: Secondary | ICD-10-CM

## 2014-12-11 DIAGNOSIS — Z13220 Encounter for screening for lipoid disorders: Secondary | ICD-10-CM

## 2014-12-11 DIAGNOSIS — Z125 Encounter for screening for malignant neoplasm of prostate: Secondary | ICD-10-CM | POA: Diagnosis not present

## 2014-12-11 DIAGNOSIS — K219 Gastro-esophageal reflux disease without esophagitis: Secondary | ICD-10-CM | POA: Diagnosis not present

## 2014-12-11 NOTE — Progress Notes (Signed)
Pre visit review using our clinic review tool, if applicable. No additional management support is needed unless otherwise documented below in the visit note. 

## 2014-12-11 NOTE — Assessment & Plan Note (Signed)
Dx by ENT - cause of hoarseness/ST and globus sensation Much improved on PPI

## 2014-12-11 NOTE — Assessment & Plan Note (Signed)
Reviewed health habits including diet and exercise and skin cancer prevention Reviewed appropriate screening tests for age  Also reviewed health mt list, fam hx and immunization status , as well as social and family history   See HPI Flu shot and pneumovax today  Labs reviewed  Recent med hx and changes addressed  Disc mt calorie intake to avoid further wt loss   Disc zoster vaccine in the future- he will check on coverage  Recommended eye exam to screen for glaucoma -he has not had an exam in 30 years!

## 2014-12-11 NOTE — Patient Instructions (Signed)
Flu vaccine and pneumonia vaccine today  You will be due for a 5 year colonoscopy around June -call us (or GI) in April or May to schedule that  If you are interested in a shingles/zoster vaccine - call your insurance to check on coverage,( you should not get it within 1 month of other vaccines) , then call us for a prescription  for it to take to a pharmacy that gives the shot , or make a nurse visit to get it here depending on your coverage     Take care of yourself  Labs look good

## 2014-12-11 NOTE — Assessment & Plan Note (Signed)
Much improvement with current tx by cardiology

## 2014-12-11 NOTE — Progress Notes (Signed)
Subjective:    Patient ID: Jacob Marsh, male    DOB: [birth year], 64 y.o.   MRN: [id number]  HPI Here for annual medicare wellness visit and chronic/acute medical issues  I have personally reviewed the Medicare Annual Wellness questionnaire and have noted 1. [name] medical and social history 2. Their use of alcohol, tobacco or illicit drugs 3. Their current medications and supplements 4. [name] functional ability including ADL's, fall risks, home safety risks and hearing or visual             impairment. 5. Diet and physical activities 6. Evidence for depression or mood disorders  The patients weight, height, BMI have been recorded in the chart and visual acuity is per eye clinic.  I have made referrals, counseling and provided education to the patient based review of the above and I have provided the pt with a written personalized care plan for preventive services.  Doing well overall  Has been going to the cardiologist - doing well  ENT saw Dr Janace Hoard- put him on PPI (nexium) for his hoarseness and ST  No longer drinking coffee and not drinking alcohol (very rare)   See scanned forms.  Routine anticipatory guidance given to patient.  See health maintenance. Colon cancer screening 6/11 - will be due for one in June Flu vaccine- did not get one , will get one today  Tetanus vaccine 5/10  Pneumovax - will get first one today  Zoster vaccine -never had the vaccine (does not think he had the chicken pox) - would think about it  Prostate cancer screening  Lab Results  Component Value Date   PSA 1.29 12/04/2014   PSA 1.68 08/10/2012   PSA 1.76 03/19/2009   no problems with urination  Nocturia 0-1 time per night -not a change  No prostate cancer in family   Advance directive - has a living will and POA written up (just amended that)  Cognitive function addressed- see scanned forms- and if abnormal then additional documentation follows.  Pretty good memory  overall   PMH and SH reviewed  Meds, vitals, and allergies reviewed.   ROS: See HPI.  Otherwise negative.     Cholesterol screen Lab Results  Component Value Date   CHOL 162 12/04/2014   CHOL 170 08/10/2012   CHOL 177 03/19/2009   Lab Results  Component Value Date   HDL 47.90 12/04/2014   HDL 43.80 08/10/2012   HDL 49.00 03/19/2009   Lab Results  Component Value Date   LDLCALC 97 12/04/2014   LDLCALC 106* 08/10/2012   LDLCALC 112* 03/19/2009   Lab Results  Component Value Date   TRIG 84.0 12/04/2014   TRIG 99.0 08/10/2012   TRIG 82.0 03/19/2009   Lab Results  Component Value Date   CHOLHDL 3 12/04/2014   CHOLHDL 4 08/10/2012   CHOLHDL 4 03/19/2009   No results found for: LDLDIRECT  Good profile He continues to eat well  Started eating more organic foods -likes that better   Still working  Has to work for another 10 years - likes what he does  Some stress however- work and home  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  Eyes: Negative for pain and visual disturbance.  Respiratory: Negative for cough and shortness of breath.   Cardiovascular: Negative for cp or palpitations    Gastrointestinal: Negative for nausea, diarrhea and constipation.  Genitourinary: Negative for urgency and frequency.  Skin: Negative for pallor or rash   Neurological: Negative for weakness, light-headedness, numbness and headaches.  Hematological: Negative for adenopathy. Does not bruise/bleed easily.  Psychiatric/Behavioral: Negative for dysphoric mood. The patient is not nervous/anxious.  pos for stressors        Objective:   Physical Exam  Constitutional: He appears well-developed and well-nourished. No distress.    Slim and well appearing   HENT:  Head: Normocephalic and atraumatic.  Right Ear: External ear normal.  Left Ear: External ear normal.  Nose: Nose normal.  Mouth/Throat: Oropharynx is clear and moist.  Eyes: Conjunctivae and EOM are normal. Pupils are equal, round, and reactive to light. Right eye exhibits no discharge. Left eye exhibits no discharge. No scleral icterus.  Neck: Normal range of motion. Neck supple. No JVD present. Carotid bruit is not present. No thyromegaly present.  Cardiovascular: Normal rate, regular rhythm, normal heart sounds and intact distal pulses.  Exam reveals no gallop.   Pulmonary/Chest: Effort normal and breath sounds normal. No respiratory distress. He has no wheezes. He exhibits no tenderness.  Abdominal: Soft. Bowel sounds are normal. He exhibits no distension, no abdominal bruit and no mass. There is no tenderness.  Musculoskeletal: He exhibits no edema or tenderness.  Lymphadenopathy:    He has no cervical adenopathy.  Neurological: He is alert. He has normal reflexes. No cranial nerve deficit. He exhibits normal muscle tone. Coordination normal.  Skin: Skin is warm and dry. No rash noted. No erythema. No pallor.  Psychiatric: He has a normal mood and affect.          Assessment & Plan:   Problem List Items Addressed This Visit      Digestive   GERD (gastroesophageal reflux disease)    Dx by ENT - cause of hoarseness/ST and globus sensation Much improved on PPI        Other   Encounter for Medicare annual wellness exam - Primary    Reviewed health habits including diet and exercise and skin cancer prevention Reviewed appropriate screening tests for age  Also reviewed health mt list, fam hx and immunization status , as well as social and family history   See HPI Flu shot and pneumovax today  Labs reviewed  Recent med hx and changes addressed  Disc mt calorie intake to avoid further wt loss   Disc zoster vaccine in the future- he will  check on coverage  Recommended eye exam to screen for glaucoma -he has not had an exam in 30 years!      Palpitations    Much improvement with current tx by cardiology      Prostate cancer screening    Lab Results  Component Value Date   PSA 1.29 12/04/2014   PSA 1.68 08/10/2012   PSA 1.76 03/19/2009   This is stable  No family history  No change in urinary history or symptoms  Will continue to follow       Screening for lipoid disorders    Good cholesterol profile Disc goals for lipids and reasons to control them Rev labs with pt Rev low sat fat diet in detail        Other Visit Diagnoses    Flu vaccine need  Relevant Orders    Flu Vaccine QUAD 36+ mos IM (Completed)    Need for 23-polyvalent pneumococcal polysaccharide vaccine        Relevant Orders    Pneumococcal polysaccharide vaccine 23-valent greater than or equal to 2yo subcutaneous/IM (Completed)

## 2014-12-11 NOTE — Assessment & Plan Note (Signed)
Good cholesterol profile Disc goals for lipids and reasons to control them Rev labs with pt Rev low sat fat diet in detail

## 2014-12-11 NOTE — Assessment & Plan Note (Signed)
Lab Results  Component Value Date   PSA 1.29 12/04/2014   PSA 1.68 08/10/2012   PSA 1.76 03/19/2009   This is stable  No family history  No change in urinary history or symptoms  Will continue to follow

## 2015-01-29 DIAGNOSIS — J387 Other diseases of larynx: Secondary | ICD-10-CM | POA: Diagnosis not present

## 2015-01-29 DIAGNOSIS — R499 Unspecified voice and resonance disorder: Secondary | ICD-10-CM | POA: Diagnosis not present

## 2015-03-01 ENCOUNTER — Encounter: Payer: Self-pay | Admitting: Gastroenterology

## 2015-03-19 NOTE — Progress Notes (Signed)
Patient ID: Jacob Marsh, male   DOB: 25-Jun-1951, 64 y.o.   MRN: [id number] Jacob Marsh is a 64 y.o.  male who returns for f/u syncope and PVC;s  He was evaluated by me 07/2012 for chest pain. ETT was performed 09/06/12 and demonstrated no ischemia. Patient exercised for 12 minutes.  He is still struggling to recover financially from the 2008 recession. He has had increased emotional stress over the last several weeks. His wife has been dx with dementia. He has not been sleeping very well. He's been drinking excessive amounts of caffeine (6-8 cups per day or more). He typically drinks about one beer per day. Recently, he has been drinking 2 beers a day. He recently had noted some orthostatic intolerance with lightheadedness upon standing. Three weeks ago, he stood up to go to the kitchen and felt lightheaded and dizzy. Prior to standing, he felt as though his heart rate may be elevated. He try to take his pulse but was not certain if it was irregular. He states that "something did not feel right." He tried to catch himself and fell to the floor on his buttocks. He denies any injury. He was likely out for seconds. He denies any confusion upon awakening. There was no seizure-like activity reported. He denies any loss of bowel or bladder function. Since this episode, he has cut way back on his caffeine intake. He has also resumed his one beer per day regimen. He feels much better. He continues to have occasional, very brief lightheadedness with standing. No further syncope. Otherwise, he denies chest pain, shortness of breath, orthopnea, PND or edema. He is usually quite active and denies any decreased exercise tolerance or exercise induced palpitations.   F/u event monitor  12/2013 reviewed shows benign PAC but 21000 PVC;s  Echo 9/22/ reveiwed and essentially normal trivial AR 09/04/14 ETT no arrhythmia or ischemia   Recently seen by Dr Lovena Le and flecainide continued with weaning of beta  blocker due to postural symptoms  Noted ECG with RBBB/LAD and long PR with borderline QRS for flecainide of 136 msec  Still with postural symptoms  BP 120 to 932 mmHg systolic   ROS: Denies fever, malais, weight loss, blurry vision, decreased visual acuity, cough, sputum, SOB, hemoptysis, pleuritic pain, palpitaitons, heartburn, abdominal pain, melena, lower extremity edema, claudication, or rash.  All other systems reviewed and negative  General:  Not postural with systolic BP 671 mmHg laying and standing  Affect appropriate Healthy:  appears stated age 28: normal Neck supple with no adenopathy JVP normal no bruits no thyromegaly Lungs clear with no wheezing and good diaphragmatic motion Heart:  S1/S2 no murmur, no rub, gallop or click PMI normal Abdomen: benighn, BS positve, no tenderness, no AAA no bruit.  No HSM or HJR Distal pulses intact with no bruits No edema Neuro non-focal Skin warm and dry No muscular weakness   Current Outpatient Prescriptions  Medication Sig Dispense Refill  . flecainide (TAMBOCOR) 100 MG tablet Take 1 tablet (100 mg total) by mouth 2 (two) times daily. 180 tablet 3  . metoprolol tartrate (LOPRESSOR) 25 MG tablet Take 0.5 tablets (12.5 mg total) by mouth 2 (two) times daily. 90 tablet 3  . Multiple Vitamin (MULTIVITAMIN WITH MINERALS) TABS Take 1 tablet by mouth daily.    . pantoprazole (PROTONIX) 40 MG tablet Take 40 mg by mouth 2 (two) times daily.    . Ranitidine HCl (ZANTAC PO) Take 2 tablets by mouth daily.  No current facility-administered medications for this visit.    Allergies  Review of patient's allergies indicates no known allergies.  Electrocardiogram: 10/28/14  SR rate 48  PR 218  LAD/RBBB QRS 136   Assessment and Plan PVC;s  F/u Taylor Unusual but seems to have suppression with flecainide.  Monitor QRS and ECG Postural:  Decrease beta blocker to daily and f/u next available check random cortisol stay hydrated  GERD:   Continue zantac  F/U Dr Lovena Le   F/u with me next available to eliminate beta blocker totally

## 2015-03-20 ENCOUNTER — Encounter: Payer: Self-pay | Admitting: Cardiovascular Disease

## 2015-03-20 ENCOUNTER — Ambulatory Visit (INDEPENDENT_AMBULATORY_CARE_PROVIDER_SITE_OTHER): Payer: Medicare Other | Admitting: Cardiovascular Disease

## 2015-03-20 VITALS — BP 90/62 | HR 59 | Ht 72.0 in | Wt 158.8 lb

## 2015-03-20 DIAGNOSIS — R55 Syncope and collapse: Secondary | ICD-10-CM

## 2015-03-20 DIAGNOSIS — I9589 Other hypotension: Secondary | ICD-10-CM | POA: Diagnosis not present

## 2015-03-20 LAB — HEMOGLOBIN A1C: Hgb A1c MFr Bld: 5.2 % (ref 4.6–6.5)

## 2015-03-20 LAB — CORTISOL: Cortisol, Plasma: 8 ug/dL

## 2015-03-20 NOTE — Patient Instructions (Signed)
Medication Instructions:  DECREASE  METOPROLOL TO  1  A  BEDTIME  Labwork: TODAY  RANDOM  CORTISOL AND   A1C  Testing/Procedures: NONE  Follow-Up: Your physician recommends that you schedule a follow-up appointment in: NEXT  AVAILABLE  WITH DR   Johnsie Cancel   Any Other Special Instructions Will Be Listed Below (If Applicable).

## 2015-03-26 ENCOUNTER — Telehealth: Payer: Self-pay | Admitting: Internal Medicine

## 2015-03-26 ENCOUNTER — Encounter: Payer: Self-pay | Admitting: Internal Medicine

## 2015-03-26 ENCOUNTER — Ambulatory Visit (INDEPENDENT_AMBULATORY_CARE_PROVIDER_SITE_OTHER): Payer: Medicare Other | Admitting: Internal Medicine

## 2015-03-26 VITALS — BP 94/60 | HR 58 | Ht 72.0 in | Wt 160.2 lb

## 2015-03-26 DIAGNOSIS — I493 Ventricular premature depolarization: Secondary | ICD-10-CM

## 2015-03-26 DIAGNOSIS — R002 Palpitations: Secondary | ICD-10-CM

## 2015-03-26 DIAGNOSIS — R42 Dizziness and giddiness: Secondary | ICD-10-CM

## 2015-03-26 NOTE — Telephone Encounter (Signed)
New Message  Pt was told to take baby aspirin in addition to other meds. Not on AVS report when he left. Pt wanted to make sure to take aspirin. Please call back and discuss.

## 2015-03-26 NOTE — Patient Instructions (Addendum)
Medication Instructions:  Your physician recommends that you continue on your current medications as directed. Please refer to the Current Medication list given to you today.   Labwork: None ordered  Testing/Procedures: None ordered  Follow-Up:  Your physician wants you to follow-up in: 6 months with Dr Knox Saliva will receive a reminder letter in the mail two months in advance. If you don't receive a letter, please call our office to schedule the follow-up appointment.    Any Other Special Instructions Will Be Listed Below (If Applicable).

## 2015-03-26 NOTE — Progress Notes (Signed)
HPI [name]  returns today for ongoing evaluation of PVC's. He is a very pleasant 64 yo man with a h/o syncope which sounds like orthostasis. Subsequent evaluation demonstrated normal LV function and a perfusion study was also normal. He wore a 24 hour holter which demonstrated over 21000 PVC's in a 24 hour period. I saw the patient several months ago and we discussed the various treatment options. He has been on flecainide 100 mg twice daily. He has had almost no palpitations. He does note some dizziness when he stands up quickly which is much improved with reduction in his beta blocker dose.  No Known Allergies   Current Outpatient Prescriptions  Medication Sig Dispense Refill  . flecainide (TAMBOCOR) 100 MG tablet Take 100 mg by mouth 2 (two) times daily.    . metoprolol tartrate (LOPRESSOR) 25 MG tablet Take 12.5 mg by mouth at bedtime.    . Multiple Vitamin (MULTIVITAMIN WITH MINERALS) TABS Take 1 tablet by mouth 2 (two) times daily.     . pantoprazole (PROTONIX) 40 MG tablet Take 40 mg by mouth 2 (two) times daily.    . Ranitidine HCl (ZANTAC PO) Take 2 tablets by mouth daily.     No current facility-administered medications for this visit.     Past Medical History  Diagnosis Date  . Depression   . Diverticulosis     History of diverticulosis// flex sig 2006//colonoscopy 01/21/2000 diverticulosis  . Hx of echocardiogram     Echo (9/14): EF 88%, grade 1 diastolic dysfunction, trivial AI, trivial MR, MAC, PASP 29    ROS:   All systems reviewed and negative except as noted in the HPI.   Past Surgical History  Procedure Laterality Date  . Tonsillectomy    . Dupuytren contracture release    . Knee arthroscopy w/ meniscal repair       Family History  Problem Relation Age of Onset  . Arthritis Mother   . Heart disease Mother     ? atrial fib  . Depression Sister   . Cancer Maternal Aunt     ?colon cancer  . Heart disease Maternal Grandmother     CAD  .  Cancer Maternal Grandmother     liver cancer  . Heart disease Maternal Grandfather     CAD  . Sudden death Neg Hx      History   Social History  . Marital Status: Married    Spouse Name: N/A  . Number of Children: N/A  . Years of Education: N/A   Occupational History  . Not on file.   Social History Main Topics  . Smoking status: Never Smoker   . Smokeless tobacco: Not on file  . Alcohol Use: 0.5 oz/week    1 drink(s) per week     Comment: social- 1-2 beer  . Drug Use: No  . Sexual Activity: Yes   Other Topics Concern  . Not on file   Social History Narrative     BP 94/60 mmHg  Pulse 58  Ht 6' (1.829 m)  Wt 160 lb 3.2 oz (72.666 kg)  BMI 21.72 kg/m2  Physical Exam:  Well appearing middle aged man, NAD HEENT: Unremarkable Neck:  6 cm JVD, no thyromegally Back:  No CVA tenderness Lungs:  Clear with no wheezes HEART:  Regular brady rhythm, no murmurs, no rubs, no clicks Abd:  soft, positive bowel sounds, no organomegally, no rebound, no guarding Ext:  2 plus pulses, no edema,  no cyanosis, no clubbing Skin:  No rashes no nodules Neuro:  CN II through XII intact, motor grossly intact  EKG - sinus bradycardia,QRS 130 ms.    Assess/Plan:

## 2015-03-26 NOTE — Telephone Encounter (Signed)
Discussed with Dr Lovena Le and he does not have to take Aspirin.  I have called and let the patient know

## 2015-03-26 NOTE — Assessment & Plan Note (Signed)
His symptoms are well controlled. He will continue flecainide 100 mg twice a day.

## 2015-03-26 NOTE — Assessment & Plan Note (Signed)
His symptoms have improved as we has reduced his dose of metoprolol. I would like him to stay on a little bit of metoprolol (or other beta blocker) if possible

## 2015-03-26 NOTE — Assessment & Plan Note (Signed)
His symptoms have improved. He will continue his current meds.

## 2015-04-19 DIAGNOSIS — J387 Other diseases of larynx: Secondary | ICD-10-CM | POA: Diagnosis not present

## 2015-04-19 DIAGNOSIS — R499 Unspecified voice and resonance disorder: Secondary | ICD-10-CM | POA: Diagnosis not present

## 2015-05-17 ENCOUNTER — Telehealth: Payer: Self-pay | Admitting: Internal Medicine

## 2015-05-17 NOTE — Telephone Encounter (Signed)
New Message       Pt calling stating he wants to know when he is suppose to come back to see Dr. Lovena Le. He knows he was just seen this May but there is no recall in Epic for pt. Please call back and advise.

## 2015-05-17 NOTE — Telephone Encounter (Signed)
Due in Nov, lmom to call in Sept for a Nov appointment for 6 week follow up

## 2015-05-24 ENCOUNTER — Encounter: Payer: Self-pay | Admitting: Gastroenterology

## 2015-05-26 NOTE — Progress Notes (Signed)
Patient ID: Jacob Marsh, male   DOB: 06-11-1951, 64 y.o.   MRN: [id number] Jacob Marsh is a 64 y.o.  male who returns for f/u syncope and PVC;s  He was evaluated by me 07/2012 for chest pain. ETT was performed 09/06/12 and demonstrated no ischemia. Patient exercised for 12 minutes.  He is still struggling to recover financially from the 2008 recession. He has had increased emotional stress   His wife has been dx with dementia. He has not been sleeping very well. He's been drinking excessive amounts of caffeine (6-8 cups per day or more). He typically drinks about one beer per day. Recently, he has been drinking 2 beers a day. He recently had noted some orthostatic intolerance with lightheadedness upon standing. Three weeks ago, he stood up to go to the kitchen and felt lightheaded and dizzy. Prior to standing, he felt as though his heart rate may be elevated. He try to take his pulse but was not certain if it was irregular. He states that "something did not feel right." He tried to catch himself and fell to the floor on his buttocks. He denies any injury. He was likely out for seconds. He denies any confusion upon awakening. There was no seizure-like activity reported. He denies any loss of bowel or bladder function. Since this episode, he has cut way back on his caffeine intake. He has also resumed his one beer per day regimen. He feels much better. He continues to have occasional, very brief lightheadedness with standing. No further syncope. Otherwise, he denies chest pain, shortness of breath, orthopnea, PND or edema. He is usually quite active and denies any decreased exercise tolerance or exercise induced palpitations.   F/u event monitor  12/2013 reviewed shows benign PAC but 21000 PVC;s  Echo 9/22/ reveiwed and essentially normal trivial AR 09/04/14 ETT no arrhythmia or ischemia   Recently seen by Dr Lovena Le and flecainide continued with weaning of beta blocker due to postural  symptoms  Noted ECG with RBBB/LAD and long PR with borderline QRS for flecainide of 136 msec  Symptoms overall improved with lower dose beta blocker and flecainide.  To see GT again 08/2015    ROS: Denies fever, malais, weight loss, blurry vision, decreased visual acuity, cough, sputum, SOB, hemoptysis, pleuritic pain, palpitaitons, heartburn, abdominal pain, melena, lower extremity edema, claudication, or rash.  All other systems reviewed and negative  General:  Not postural with systolic BP 947 mmHg laying and standing  Affect appropriate Healthy:  appears stated age 65: normal Neck supple with no adenopathy JVP normal no bruits no thyromegaly Lungs clear with no wheezing and good diaphragmatic motion Heart:  S1/S2 no murmur, no rub, gallop or click PMI normal Abdomen: benighn, BS positve, no tenderness, no AAA no bruit.  No HSM or HJR Distal pulses intact with no bruits No edema Neuro non-focal Skin warm and dry No muscular weakness   Current Outpatient Prescriptions  Medication Sig Dispense Refill  . flecainide (TAMBOCOR) 100 MG tablet Take 100 mg by mouth 2 (two) times daily.    . metoprolol tartrate (LOPRESSOR) 25 MG tablet Take 12.5 mg by mouth at bedtime.    . Multiple Vitamin (MULTIVITAMIN WITH MINERALS) TABS Take 1 tablet by mouth 2 (two) times daily.     . ranitidine (ZANTAC) 75 MG tablet Take 75 mg by mouth 2 (two) times daily.     No current facility-administered medications for this visit.    Allergies  Review of patient's allergies indicates  no known allergies.  Electrocardiogram: 10/28/14  SR rate 48  PR 218  LAD/RBBB QRS 136   Assessment and Plan PVC;s  F/u Taylor Unusual but seems to have suppression with flecainide.  Monitor QRS and ECG Postural:  Hydration cortisol normal better with lower dose beta blocker  GERD:  Continue zantac  F/U Dr Lovena Le  November

## 2015-05-28 ENCOUNTER — Encounter: Payer: Self-pay | Admitting: Cardiovascular Disease

## 2015-05-28 ENCOUNTER — Ambulatory Visit (INDEPENDENT_AMBULATORY_CARE_PROVIDER_SITE_OTHER): Payer: Medicare Other | Admitting: Cardiovascular Disease

## 2015-05-28 VITALS — BP 94/68 | HR 63 | Ht 72.0 in | Wt 158.4 lb

## 2015-05-28 DIAGNOSIS — I493 Ventricular premature depolarization: Secondary | ICD-10-CM

## 2015-05-28 NOTE — Patient Instructions (Signed)
Medication Instructions:  NO CHANGES  Labwork: NONE  Testing/Procedures: NONE  Follow-Up: Your physician wants you to follow-up in: YEAR WITH  DR NISHAN You will receive a reminder letter in the mail two months in advance. If you don't receive a letter, please call our office to schedule the follow-up appointment.   Any Other Special Instructions Will Be Listed Below (If Applicable).   

## 2015-06-21 ENCOUNTER — Encounter: Payer: Self-pay | Admitting: Gastroenterology

## 2015-06-25 DIAGNOSIS — M720 Palmar fascial fibromatosis [Dupuytren]: Secondary | ICD-10-CM | POA: Diagnosis not present

## 2015-07-13 ENCOUNTER — Other Ambulatory Visit: Payer: Self-pay | Admitting: Internal Medicine

## 2015-07-16 ENCOUNTER — Other Ambulatory Visit: Payer: Self-pay

## 2015-07-16 DIAGNOSIS — R002 Palpitations: Secondary | ICD-10-CM

## 2015-07-16 MED ORDER — FLECAINIDE ACETATE 100 MG PO TABS: 100.0000 mg | ORAL_TABLET | Freq: Two times a day (BID) | ORAL | Status: DC

## 2015-07-16 MED ORDER — METOPROLOL TARTRATE 25 MG PO TABS: 12.5000 mg | ORAL_TABLET | Freq: Every day | ORAL | Status: DC

## 2015-07-16 NOTE — Telephone Encounter (Signed)
flecainide (TAMBOCOR) 100 MG tablet Take 100 mg by mouth 2 (two) times daily metoprolol tartrate (LOPRESSOR) 25 MG tablet Take 12.5 mg by mouth at bedtime  PVCs (premature ventricular contractions) - Evans Lance, MD at 03/26/2015 4:12 PM     Status: Written Related Problem: PVCs (premature ventricular contractions)   Expand All Collapse All   His symptoms are well controlled. He will continue flecainide 100 mg twice a day.        Postural dizziness - Evans Lance, MD at 03/26/2015 4:13 PM     Status: Written Related Problem: Postural dizziness   Expand All Collapse All   His symptoms have improved as we has reduced his dose of metoprolol. I would like him to stay on a little bit of metoprolol (or other beta blocker) if possible

## 2015-08-05 DIAGNOSIS — L570 Actinic keratosis: Secondary | ICD-10-CM | POA: Diagnosis not present

## 2015-08-05 DIAGNOSIS — D225 Melanocytic nevi of trunk: Secondary | ICD-10-CM | POA: Diagnosis not present

## 2015-08-05 DIAGNOSIS — L814 Other melanin hyperpigmentation: Secondary | ICD-10-CM | POA: Diagnosis not present

## 2015-08-05 DIAGNOSIS — L821 Other seborrheic keratosis: Secondary | ICD-10-CM | POA: Diagnosis not present

## 2015-08-06 ENCOUNTER — Encounter: Payer: Medicare Other | Admitting: Gastroenterology

## 2015-08-21 ENCOUNTER — Ambulatory Visit (AMBULATORY_SURGERY_CENTER): Payer: Self-pay

## 2015-08-21 VITALS — Ht 72.0 in | Wt 160.0 lb

## 2015-08-21 DIAGNOSIS — Z860100 Personal history of colon polyps, unspecified: Secondary | ICD-10-CM

## 2015-08-21 MED ORDER — SUPREP BOWEL PREP KIT 17.5-3.13-1.6 GM/177ML PO SOLN: 1.0000 | Freq: Once | ORAL | Status: DC

## 2015-08-21 NOTE — Progress Notes (Signed)
No allergies to eggs or soy No home oxygen No past problems with anesthesia except chest pressure and tightness before loss of consciousness; last cardiology report says no ischemia No diet/weight loss meds  Has internet; have had colonoscopy before ; refused emmi

## 2015-08-22 ENCOUNTER — Encounter: Payer: Self-pay | Admitting: Internal Medicine

## 2015-08-22 ENCOUNTER — Ambulatory Visit (INDEPENDENT_AMBULATORY_CARE_PROVIDER_SITE_OTHER): Payer: Medicare Other | Admitting: Internal Medicine

## 2015-08-22 VITALS — BP 98/60 | HR 62 | Ht 72.0 in | Wt 161.2 lb

## 2015-08-22 DIAGNOSIS — Z87898 Personal history of other specified conditions: Secondary | ICD-10-CM

## 2015-08-22 DIAGNOSIS — R002 Palpitations: Secondary | ICD-10-CM | POA: Diagnosis not present

## 2015-08-22 DIAGNOSIS — I493 Ventricular premature depolarization: Secondary | ICD-10-CM

## 2015-08-22 DIAGNOSIS — R55 Syncope and collapse: Secondary | ICD-10-CM | POA: Diagnosis not present

## 2015-08-22 NOTE — Patient Instructions (Signed)
Medication Instructions:  Your physician recommends that you continue on your current medications as directed. Please refer to the Current Medication list given to you today.  Labwork: None ordered  Testing/Procedures: None ordered  Follow-Up: Your physician wants you to follow-up in: 1 year with Dr. Taylor.  You will receive a reminder letter in the mail two months in advance. If you don't receive a letter, please call our office to schedule the follow-up appointment.   Any Other Special Instructions Will Be Listed Below (If Applicable).  If you need a refill on your cardiac medications before your next appointment, please call your pharmacy.  Thank you for choosing  [facility]!!         

## 2015-08-22 NOTE — Progress Notes (Signed)
HPI [name]  returns today for ongoing evaluation of PVC's. He is a very pleasant 64 yo man with a h/o syncope thought to be due to orthostasis. Subsequent evaluation demonstrated normal LV function and a perfusion study was also normal. He wore a 24 hour holter which demonstrated over 21000 PVC's in a 24 hour period. I saw the patient and we discussed the various treatment options. He has been on flecainide 100 mg twice daily. He has had almost no palpitations. He does note some dizziness when he stands up quickly which is much improved with reduction in his beta blocker dose. He notes that he wakes up early in the morning. He also notes that his wife has been diagnosed with early dementia. No other complaints. His blood pressure runs low at times. No Known Allergies   Current Outpatient Prescriptions  Medication Sig Dispense Refill  . flecainide (TAMBOCOR) 100 MG tablet Take 1 tablet (100 mg total) by mouth 2 (two) times daily. 60 tablet 3  . MELATONIN PO Take 6 mg by mouth at bedtime.     . metoprolol tartrate (LOPRESSOR) 25 MG tablet Take 0.5 tablets (12.5 mg total) by mouth at bedtime. 15 tablet 3  . Multiple Vitamin (MULTIVITAMIN WITH MINERALS) TABS Take 1 tablet by mouth 2 (two) times daily.     . Omega-3 Fatty Acids (FISH OIL PO) Take 1 capsule by mouth daily.     . ranitidine (ZANTAC) 75 MG tablet Take 75 mg by mouth 2 (two) times daily.     No current facility-administered medications for this visit.     Past Medical History  Diagnosis Date  . Depression   . Diverticulosis     History of diverticulosis// flex sig 2006//colonoscopy 01/21/2000 diverticulosis  . Hx of echocardiogram     Echo (9/14): EF 41%, grade 1 diastolic dysfunction, trivial AI, trivial MR, MAC, PASP 29    ROS:   All systems reviewed and negative except as noted in the HPI.   Past Surgical History  Procedure Laterality Date  . Tonsillectomy    . Dupuytren contracture release    . Knee  arthroscopy w/ meniscal repair       Family History  Problem Relation Age of Onset  . Arthritis Mother   . Heart disease Mother     ? atrial fib  . Depression Sister   . Cancer Maternal Aunt     ?colon cancer  . Heart disease Maternal Grandmother     CAD  . Cancer Maternal Grandmother     liver cancer  . Heart disease Maternal Grandfather     CAD  . Sudden death Neg Hx   . Colon polyps Father   . Colon cancer Paternal Grandmother      Social History   Social History  . Marital Status: Married    Spouse Name: N/A  . Number of Children: N/A  . Years of Education: N/A   Occupational History  . Not on file.   Social History Main Topics  . Smoking status: Never Smoker   . Smokeless tobacco: Never Used  . Alcohol Use: 0.6 oz/week    1 Standard drinks or equivalent per week     Comment: social- 1-2 beer  . Drug Use: No  . Sexual Activity: Yes   Other Topics Concern  . Not on file   Social History Narrative     BP 98/60 mmHg  Pulse 62  Ht 6' (1.829 m)  Wt 161 lb 3.2 oz (73.12 kg)  BMI 21.86 kg/m2  Physical Exam:  Well appearing middle aged man, NAD HEENT: Unremarkable Neck:  6 cm JVD, no thyromegally Back:  No CVA tenderness Lungs:  Clear with no wheezes HEART:  Regular brady rhythm, no murmurs, no rubs, no clicks Abd:  soft, positive bowel sounds, no organomegally, no rebound, no guarding Ext:  2 plus pulses, no edema, no cyanosis, no clubbing Skin:  No rashes no nodules Neuro:  CN II through XII intact, motor grossly intact  EKG - sinus bradycardia,QRS 130 ms. RBBB   Assess/Plan:

## 2015-08-22 NOTE — Assessment & Plan Note (Signed)
His symptoms are controlled on flecainide. I have recommended he continue this medication for the next 12 months and if he is doing well, will try to wean him down when I see him next.

## 2015-08-22 NOTE — Assessment & Plan Note (Signed)
Non-exertional and likely due to esophageal spasm.

## 2015-08-22 NOTE — Assessment & Plan Note (Signed)
He is improved. Continue his current meds.

## 2015-08-22 NOTE — Assessment & Plan Note (Signed)
No recurrent episodes. I have asked him to increase his salt and fluid intake.

## 2015-09-02 ENCOUNTER — Telehealth: Payer: Self-pay | Admitting: Gastroenterology

## 2015-09-02 NOTE — Telephone Encounter (Signed)
Patient notified to keep the appt for Wed.  He is advised to avoid salads, veg etc.

## 2015-09-04 ENCOUNTER — Encounter: Payer: Self-pay | Admitting: Gastroenterology

## 2015-09-04 ENCOUNTER — Ambulatory Visit (AMBULATORY_SURGERY_CENTER): Payer: Medicare Other | Admitting: Gastroenterology

## 2015-09-04 VITALS — BP 100/56 | HR 66 | Temp 96.7°F | Resp 25 | Ht 72.0 in | Wt 161.0 lb

## 2015-09-04 DIAGNOSIS — Z860100 Personal history of colon polyps, unspecified: Secondary | ICD-10-CM

## 2015-09-04 MED ORDER — SODIUM CHLORIDE 0.9 % IV SOLN: 500.0000 mL | INTRAVENOUS | Status: DC

## 2015-09-04 NOTE — Progress Notes (Signed)
Report to PACU, RN, vss, BBS= Clear.  

## 2015-09-04 NOTE — Op Note (Signed)
[place]  [address] [place], [id number]   COLONOSCOPY PROCEDURE REPORT  PATIENT: Petro, Talent  MR#: [id number] BIRTHDATE: 1950/11/25 , 28  yrs. old GENDER: male ENDOSCOPIST: Ladene Artist, MD, [facility] PROCEDURE DATE:  09/04/2015 PROCEDURE:   Colonoscopy, surveillance First Screening Colonoscopy - Avg.  risk and is 50 yrs.  old or older - No.  Prior Negative Screening - Now for repeat screening. N/A  History of Adenoma - Now for follow-up colonoscopy & has been > or = to 3 yrs.  Yes hx of adenoma.  Has been 3 or more years since last colonoscopy.  Polyps removed today? No Recommend repeat exam, <10 yrs? No ASA CLASS:   Class II INDICATIONS:Screening for colonic neoplasia and PH Colon Adenoma. MEDICATIONS: Monitored anesthesia care and Propofol 200 mg IV DESCRIPTION OF PROCEDURE:   After the risks benefits and alternatives of the procedure were thoroughly explained, informed consent was obtained.  The digital rectal exam revealed no abnormalities of the rectum.   The LB [id number]  endoscope was introduced through the anus and advanced to the cecum, which was identified by both the appendix and ileocecal valve. No adverse events experienced.   The quality of the prep was good.  (Suprep was used)  The instrument was then slowly withdrawn as the colon was fully examined. Estimated blood loss is zero unless otherwise noted in this procedure report.    COLON FINDINGS: There was moderate diverticulosis noted in the sigmoid colon, descending colon, and transverse colon.   The examination was otherwise normal.  Retroflexed views revealed no abnormalities. The time to cecum = 2.1 Withdrawal time = 9.5   The scope was withdrawn and the procedure completed. COMPLICATIONS: There were no immediate complications.  ENDOSCOPIC IMPRESSION: 1.   Moderate diverticulosis in the sigmoid colon, descending colon, and transverse colon 2.   The examination was  otherwise normal  RECOMMENDATIONS: 1.  High fiber diet with liberal fluid intake. 2.  Repeat Colonscopy in 10 years.  eSigned:  Ladene Artist, MD, [facility] 09/04/2015 1:46 PM

## 2015-09-04 NOTE — Patient Instructions (Signed)
Diverticulosis seen today. Handouts given on diverticulosis and high fiber diet.  Repeat colonoscopy in 10 years. Call us with any questions or concerns. Thank you.   YOU HAD AN ENDOSCOPIC PROCEDURE TODAY AT THE [facility]:   Refer to the procedure report that was given to you for any specific questions about what was found during the examination.  If the procedure report does not answer your questions, please call your gastroenterologist to clarify.  If you requested that your care partner not be given the details of your procedure findings, then the procedure report has been included in a sealed envelope for you to review at your convenience later.  YOU SHOULD EXPECT: Some feelings of bloating in the abdomen. Passage of more gas than usual.  Walking can help get rid of the air that was put into your GI tract during the procedure and reduce the bloating. If you had a lower endoscopy (such as a colonoscopy or flexible sigmoidoscopy) you may notice spotting of blood in your stool or on the toilet paper. If you underwent a bowel prep for your procedure, you may not have a normal bowel movement for a few days.  Please Note:  You might notice some irritation and congestion in your nose or some drainage.  This is from the oxygen used during your procedure.  There is no need for concern and it should clear up in a day or so.  SYMPTOMS TO REPORT IMMEDIATELY:   Following lower endoscopy (colonoscopy or flexible sigmoidoscopy):  Excessive amounts of blood in the stool  Significant tenderness or worsening of abdominal pains  Swelling of the abdomen that is new, acute  Fever of 100F or higher   For urgent or emergent issues, a gastroenterologist can be reached at any hour by calling [phone].   DIET: Your first meal following the procedure should be a small meal and then it is ok to progress to your normal diet. Heavy or fried foods are harder to digest and may make you feel nauseous  or bloated.  Likewise, meals heavy in dairy and vegetables can increase bloating.  Drink plenty of fluids but you should avoid alcoholic beverages for 24 hours.  ACTIVITY:  You should plan to take it easy for the rest of today and you should NOT DRIVE or use heavy machinery until tomorrow (because of the sedation medicines used during the test).    FOLLOW UP: Our staff will call the number listed on your records the next business day following your procedure to check on you and address any questions or concerns that you may have regarding the information given to you following your procedure. If we do not reach you, we will leave a message.  However, if you are feeling well and you are not experiencing any problems, there is no need to return our call.  We will assume that you have returned to your regular daily activities without incident.  If any biopsies were taken you will be contacted by phone or by letter within the next 1-3 weeks.  Please call us at [phone] if you have not heard about the biopsies in 3 weeks.    SIGNATURES/CONFIDENTIALITY: You and/or your care partner have signed paperwork which will be entered into your electronic medical record.  These signatures attest to the fact that that the information above on your After Visit Summary has been reviewed and is understood.  Full responsibility of the confidentiality of this discharge information lies with you   and/or your care-partner. 

## 2015-09-05 ENCOUNTER — Telehealth: Payer: Self-pay | Admitting: Emergency Medicine

## 2015-09-05 NOTE — Telephone Encounter (Signed)
  Follow up Call-  Call back number 09/04/2015  Post procedure Call Back phone  # [phone]  Permission to leave phone message Yes     Patient questions:  Do you have a fever, pain , or abdominal swelling? No. Pain Score  0 *  Have you tolerated food without any problems? Yes.    Have you been able to return to your normal activities? Yes.    Do you have any questions about your discharge instructions: Diet   No. Medications  No. Follow up visit  No.  Do you have questions or concerns about your Care? No.  Actions: * If pain score is 4 or above: No action needed, pain <4.

## 2015-10-02 ENCOUNTER — Telehealth: Payer: Self-pay | Admitting: Internal Medicine

## 2015-10-02 NOTE — Telephone Encounter (Signed)
New message  Patient c/o Palpitations:  High priority if patient c/o lightheadedness and shortness of breath.  1. How long have you been having palpitations? Past 3 days that comes and goes. Seems to come on heavy in the afternoon. He takes his meds and it goes away   2. Are you currently experiencing lightheadedness and shortness of breath? No   3. Have you checked your BP and heart rate? (document readings) He hasn't checked it  4. Are you experiencing any other symptoms? Heart rate is a little faster than normal and skipping beats  Comments: Offered the 4:15p appt for 12/08. Pt declined has other obligations to attend, requests a call back to discuss.

## 2015-10-02 NOTE — Telephone Encounter (Signed)
Spoke with patient and he says he is okay in the morning and in the afternoon he starts skipping.  Once home around 6pm he takes medication and within 1-1 1/2 hours it stops.  He feels his HR is faster.  He is not having any more dizziness spells after the decreasing of the Metoprolol to 12.5mg  daily.  It does this for 3-4 days and takes several days to get back to normal.  He will get caught up in work and stress and forget to take his Flecainide some mornings.

## 2015-10-07 DIAGNOSIS — M720 Palmar fascial fibromatosis [Dupuytren]: Secondary | ICD-10-CM | POA: Diagnosis not present

## 2015-10-09 NOTE — Telephone Encounter (Signed)
Spoke with patient and he is much better.  Especially yesterday and today.  It will skip once out of 25 beats.  He will continue to keep a log of his rates and will let me know if his symptoms worsen.

## 2015-10-30 ENCOUNTER — Telehealth: Payer: Self-pay | Admitting: Internal Medicine

## 2015-10-30 DIAGNOSIS — R002 Palpitations: Secondary | ICD-10-CM

## 2015-10-30 NOTE — Telephone Encounter (Addendum)
Mid day and late afternoon he is experiencing palpitations.  Feels like its beating hard.  Feels if he takes his Flecainide in 1- 1 1/2 hours feels relief.  Can we increase his dose

## 2015-10-30 NOTE — Telephone Encounter (Signed)
New message      Pt states he is still having "issues".  He spoke with the nurse about this same thing in nov.  He would not tell me any more.

## 2015-10-31 NOTE — Telephone Encounter (Signed)
Discussed with Dr Lovena Le, he recommends patient take 50mg  of Flecainide when he is symptomatic.  I have left the patient a message to return my call

## 2015-11-01 ENCOUNTER — Telehealth: Payer: Self-pay | Admitting: Internal Medicine

## 2015-11-01 MED ORDER — FLECAINIDE ACETATE 100 MG PO TABS: ORAL_TABLET | ORAL | Status: DC

## 2015-11-01 NOTE — Telephone Encounter (Signed)
I have left him a message with details and let him know he did not have to call me back unless further questions.  I have called in new rx

## 2015-11-01 NOTE — Telephone Encounter (Signed)
Follow up      Returning a call to [employer]

## 2015-11-01 NOTE — Telephone Encounter (Signed)
Left a detailed message for patient with Dr Tanna Furry recommendations.  I have asked he call me back to discuss.

## 2015-11-01 NOTE — Telephone Encounter (Signed)
New Message   Pt called. Received Your message. Thank you so much

## 2015-11-04 ENCOUNTER — Other Ambulatory Visit: Payer: Self-pay | Admitting: Internal Medicine

## 2015-11-22 ENCOUNTER — Telehealth: Payer: Self-pay | Admitting: Internal Medicine

## 2015-11-22 NOTE — Telephone Encounter (Signed)
Spoke with patient and over the last several weeks he has had 2-3 episodes where he became SOB while walking into his office and once while rushing going up and down stairs at his home.  He has been taking the extra Flecainide when he feels his PVC's but not often.  He says he knows when "he's out of rhythm".  Today he feels good and not having any problems just concerned with the SOB episodes.  I let him know I would discuss with Dr Lovena Le next week and call him back.  He is aware that Dr Lovena Le is not back in the office until Tues.

## 2015-11-22 NOTE — Telephone Encounter (Signed)
Pt c/o Shortness Of Breath: STAT if SOB developed within the last 24 hours or pt is noticeably SOB on the phone  1. Are you currently SOB (can you hear that pt is SOB on the phone)? no  2. How long have you been experiencing SOB? 2wks  3. Are you SOB when sitting or when up moving around? Moving around  4. Are you currently experiencing any other symptoms? No  Pt want call back to speak to nurse.

## 2015-12-06 NOTE — Telephone Encounter (Signed)
Discussed with Dr Lovena Le and he recommends patient to have a GXT.  I have spoken with patient and since the extra Flecainide mid day he feels much better and would like to hold off on the GXT for now.  He will call back if he feels he needs to have test.

## 2016-02-04 DIAGNOSIS — H0014 Chalazion left upper eyelid: Secondary | ICD-10-CM | POA: Diagnosis not present

## 2016-02-13 ENCOUNTER — Telehealth: Payer: Self-pay | Admitting: Internal Medicine

## 2016-02-13 NOTE — Telephone Encounter (Signed)
Return call from pt stating yesterday he took his medications as ordered and few hours later he felt heart "working hard and pumping fast". Pt stated he took and additional half of Flecanide and few hours later while walking to car in parking garage and going up stairs he got very SOB. Pt rested in car for 2-5 minutes after event and it all cleared and he felt fine. This is the first time since he last called about it that this has happened but pt is worried as he is going out of town on 4/26-5/1 to [place] for son's wedding. Pt stated he has been taking in fluids as instructed and has no c/o of passing out or dizziness with these episodes. Pt also stated that he has been taking an additional 1/2 tablet (50 mg) of Flecanide almost daily when he feels stressed or like his heart is beating fast. Told him I would forward to Dr. Lovena Le in case he has suggested changes but to keep scheduled appt on 5/2 with Dr. Lovena Le to discuss. Pt verbalized understanding, no additional questions at this time.

## 2016-02-13 NOTE — Telephone Encounter (Signed)
Pt needs to call back in 30 mins to discuss.

## 2016-02-13 NOTE — Telephone Encounter (Signed)
New message    Patient calling  C/O having issues  appt made with Dr. Lovena Le on 5/2 . Not an emergency need advise going out of town on  4/26 to 5/1.   Ask patient what issues he was having  - rapid heart rate , out of breathe climbing stairs after taken medication as directed.   Need to discuss medication levels when on vacation - son wedding.    No chest pain, no sob, fine this am .

## 2016-02-25 ENCOUNTER — Ambulatory Visit (INDEPENDENT_AMBULATORY_CARE_PROVIDER_SITE_OTHER): Payer: Medicare Other | Admitting: Internal Medicine

## 2016-02-25 ENCOUNTER — Encounter: Payer: Self-pay | Admitting: Internal Medicine

## 2016-02-25 VITALS — BP 92/64 | HR 51 | Ht 72.0 in | Wt 159.8 lb

## 2016-02-25 DIAGNOSIS — I493 Ventricular premature depolarization: Secondary | ICD-10-CM

## 2016-02-25 DIAGNOSIS — R55 Syncope and collapse: Secondary | ICD-10-CM

## 2016-02-25 NOTE — Patient Instructions (Signed)
Medication Instructions:  Your physician recommends that you continue on your current medications as directed. Please refer to the Current Medication list given to you today.   Labwork: None ordered   Testing/Procedures: Your physician has requested that you have an exercise tolerance test. For further information please visit [email or web address]. Please also follow instruction sheet, as given.    Follow-Up: Dr Lovena Le will do the GXT  Any Other Special Instructions Will Be Listed Below (If Applicable).     If you need a refill on your cardiac medications before your next appointment, please call your pharmacy.

## 2016-02-25 NOTE — Progress Notes (Signed)
HPI Jacob Marsh  returns today for ongoing evaluation of PVC's and episodic sob. He is a very pleasant 65 yo man with a h/o syncope thought to be due to orthostasis. Subsequent evaluation demonstrated normal LV function and a perfusion study was also normal. He wore a 24 hour holter which demonstrated over 21000 PVC's in a 24 hour period. I saw the patient and we discussed the various treatment options. He has been on flecainide 100 mg twice daily. He has had rare breakthroughs of palpitations. His biggest complaint is related to episodes of weakness, sob and near syncope associated with palpitations.  No Known Allergies   Current Outpatient Prescriptions  Medication Sig Dispense Refill  . flecainide (TAMBOCOR) 100 MG tablet Take one tablet by mouth twice daily and okay to take 1/2 tablet midday prn palpitations 75 tablet 3  . metoprolol tartrate (LOPRESSOR) 25 MG tablet TAKE 1/2 TABLET(12.5 MG) BY MOUTH AT BEDTIME 15 tablet 10  . Multiple Vitamin (MULTIVITAMIN WITH MINERALS) TABS Take 1 tablet by mouth 2 (two) times daily.     . Omega-3 Fatty Acids (FISH OIL PO) Take 1 capsule by mouth daily.     . ranitidine (ZANTAC) 75 MG tablet Take 75 mg by mouth 2 (two) times daily.     No current facility-administered medications for this visit.     Past Medical History  Diagnosis Date  . Depression   . Diverticulosis     History of diverticulosis// flex sig 2006//colonoscopy 01/21/2000 diverticulosis  . Hx of echocardiogram     Echo (9/14): EF XX123456, grade 1 diastolic dysfunction, trivial AI, trivial MR, MAC, PASP 29  . PVC (premature ventricular contraction)   . GERD (gastroesophageal reflux disease)   . Trigger finger of both hands     ROS:   All systems reviewed and negative except as noted in the HPI.   Past Surgical History  Procedure Laterality Date  . Tonsillectomy    . Dupuytren contracture release    . Wisdom tooth extraction       Family History  Problem Relation  Age of Onset  . Arthritis Mother   . Heart disease Mother     ? atrial fib  . Depression Sister   . Cancer Maternal Aunt     ?colon cancer  . Heart disease Maternal Grandmother     CAD  . Cancer Maternal Grandmother     liver cancer  . Heart disease Maternal Grandfather     CAD  . Sudden death Neg Hx   . Colon polyps Father   . Colon cancer Paternal Grandmother      Social History   Social History  . Marital Status: Married    Spouse Name: N/A  . Number of Children: N/A  . Years of Education: N/A   Occupational History  . Not on file.   Social History Main Topics  . Smoking status: Never Smoker   . Smokeless tobacco: Never Used  . Alcohol Use: 0.6 oz/week    1 Standard drinks or equivalent per week     Comment: social- 1-2 beer  . Drug Use: No  . Sexual Activity: Yes   Other Topics Concern  . Not on file   Social History Narrative     BP 92/64 mmHg  Pulse 51  Ht 6' (1.829 m)  Wt 159 lb 12.8 oz (72.485 kg)  BMI 21.67 kg/m2  Physical Exam:  Well appearing middle aged man, NAD HEENT: Unremarkable Neck:  6 cm JVD, no thyromegally Back:  No CVA tenderness Lungs:  Clear with no wheezes HEART:  Regular brady rhythm, no murmurs, no rubs, no clicks Abd:  soft, positive bowel sounds, no organomegally, no rebound, no guarding Ext:  2 plus pulses, no edema, no cyanosis, no clubbing Skin:  No rashes no nodules Neuro:  CN II through XII intact, motor grossly intact  EKG - sinus bradycardia,QRS 130 ms. RBBB   Assess/Plan: 1. PVC's - his symptoms are well controlled.  2. Sob - he has had a couple of episodes and these are episodic and associated with the sensation of a rapid HR. As they occur with exertion, will try to reproduce by having him undergo exercise testing 3. Heart racing - associated with his heart racing is the sensation of near syncope. Will consider having him undergo insertion of an ILR 4. Syncope - he has had no recurrent episodes  Jacob Marsh.D.

## 2016-03-04 DIAGNOSIS — H0011 Chalazion right upper eyelid: Secondary | ICD-10-CM | POA: Diagnosis not present

## 2016-03-04 DIAGNOSIS — H0014 Chalazion left upper eyelid: Secondary | ICD-10-CM | POA: Diagnosis not present

## 2016-03-16 ENCOUNTER — Other Ambulatory Visit: Payer: Self-pay | Admitting: Internal Medicine

## 2016-03-17 ENCOUNTER — Ambulatory Visit (INDEPENDENT_AMBULATORY_CARE_PROVIDER_SITE_OTHER): Payer: Medicare Other

## 2016-03-17 DIAGNOSIS — I493 Ventricular premature depolarization: Secondary | ICD-10-CM

## 2016-03-17 DIAGNOSIS — R55 Syncope and collapse: Secondary | ICD-10-CM

## 2016-03-17 LAB — EXERCISE TOLERANCE TEST
Estimated workload: 13.4 METS
Exercise duration (min): 12 min
Exercise duration (sec): 0 s
MPHR: 154 {beats}/min
Peak HR: 130 {beats}/min
Percent HR: 84 %
RPE: 17
Rest HR: 53 {beats}/min

## 2016-03-20 DIAGNOSIS — H0011 Chalazion right upper eyelid: Secondary | ICD-10-CM | POA: Diagnosis not present

## 2016-06-26 DIAGNOSIS — H0011 Chalazion right upper eyelid: Secondary | ICD-10-CM | POA: Diagnosis not present

## 2016-08-19 DIAGNOSIS — L814 Other melanin hyperpigmentation: Secondary | ICD-10-CM | POA: Diagnosis not present

## 2016-08-19 DIAGNOSIS — D225 Melanocytic nevi of trunk: Secondary | ICD-10-CM | POA: Diagnosis not present

## 2016-09-07 ENCOUNTER — Telehealth: Payer: Self-pay | Admitting: Family Medicine

## 2016-09-07 DIAGNOSIS — Z1159 Encounter for screening for other viral diseases: Secondary | ICD-10-CM | POA: Insufficient documentation

## 2016-09-07 NOTE — Telephone Encounter (Signed)
-----   Message from Eustace Pen, LPN sent at [id number]  9:11 PM EST ----- Regarding: Lab Orders 11/16 Please add Hep C. Thank you.

## 2016-09-09 ENCOUNTER — Encounter: Payer: Self-pay | Admitting: Internal Medicine

## 2016-09-09 ENCOUNTER — Ambulatory Visit (INDEPENDENT_AMBULATORY_CARE_PROVIDER_SITE_OTHER): Payer: Medicare Other | Admitting: Internal Medicine

## 2016-09-09 VITALS — BP 90/64 | HR 63 | Ht 72.0 in | Wt 159.6 lb

## 2016-09-09 DIAGNOSIS — I493 Ventricular premature depolarization: Secondary | ICD-10-CM

## 2016-09-09 MED ORDER — METOPROLOL TARTRATE 25 MG PO TABS: ORAL_TABLET | ORAL | 3 refills | Status: DC

## 2016-09-09 NOTE — Patient Instructions (Signed)
Medication Instructions:  Your physician recommends that you continue on your current medications as directed. Please refer to the Current Medication list given to you today.   Labwork: None Ordered   Testing/Procedures: None Ordered   Follow-Up: Your physician wants you to follow-up in: 1 year with Dr. Lovena Le.  You will receive a reminder letter in the mail two months in advance. If you don't receive a letter, please call our office to schedule the follow-up appointment.    Any Other Special Instructions Will Be Listed Below (If Applicable).

## 2016-09-09 NOTE — Progress Notes (Signed)
HPI Jacob Marsh  returns today for ongoing evaluation of PVC's and episodic sob. He is a very pleasant 65 yo man with a h/o syncope thought to be due to orthostasis. Subsequent evaluation demonstrated normal LV function and a perfusion study was also normal. He wore a 24 hour holter which demonstrated over 21000 PVC's in a 24 hour period. He has done well on flecainide with only occaisional episodes. Stress and a lack of sleep tend to make them worse. He takes an additional half tablet of flecainide if he knows he is to have a stressful day.  No Known Allergies   Current Outpatient Prescriptions  Medication Sig Dispense Refill  . flecainide (TAMBOCOR) 100 MG tablet Take 1 tablet by mouth twice daily and okay to take 1/2 tablet by mouth midday as needed for palpitations 75 tablet 11  . Melatonin 5 MG CAPS Take 10 mg by mouth at bedtime. Melatonin XR    . metoprolol tartrate (LOPRESSOR) 25 MG tablet TAKE 1/2 TABLET(12.5 MG) BY MOUTH AT BEDTIME 45 tablet 3  . Multiple Vitamin (MULTIVITAMIN WITH MINERALS) TABS Take 1 tablet by mouth 2 (two) times daily.     . Omega-3 Fatty Acids (FISH OIL PO) Take 1 capsule by mouth daily.     . pantoprazole (PROTONIX) 40 MG tablet Take 1 tablet by mouth daily. For acid reflux    . ranitidine (ZANTAC) 75 MG tablet Take 75 mg by mouth at bedtime.      No current facility-administered medications for this visit.      Past Medical History:  Diagnosis Date  . Depression   . Diverticulosis    History of diverticulosis// flex sig 2006//colonoscopy 01/21/2000 diverticulosis  . GERD (gastroesophageal reflux disease)   . Hx of echocardiogram    Echo (9/14): EF XX123456, grade 1 diastolic dysfunction, trivial AI, trivial MR, MAC, PASP 29  . PVC (premature ventricular contraction)   . Trigger finger of both hands     ROS:   All systems reviewed and negative except as noted in the HPI.   Past Surgical History:  Procedure Laterality Date  . DUPUYTREN  CONTRACTURE RELEASE    . TONSILLECTOMY    . WISDOM TOOTH EXTRACTION       Family History  Problem Relation Age of Onset  . Arthritis Mother   . Heart disease Mother     ? atrial fib  . Depression Sister   . Cancer Maternal Aunt     ?colon cancer  . Heart disease Maternal Grandmother     CAD  . Cancer Maternal Grandmother     liver cancer  . Heart disease Maternal Grandfather     CAD  . Sudden death Neg Hx   . Colon polyps Father   . Colon cancer Paternal Grandmother      Social History   Social History  . Marital status: Married    Spouse name: N/A  . Number of children: N/A  . Years of education: N/A   Occupational History  . Not on file.   Social History Main Topics  . Smoking status: Never Smoker  . Smokeless tobacco: Never Used  . Alcohol use 0.6 oz/week    1 Standard drinks or equivalent per week     Comment: social- 1-2 beer  . Drug use: No  . Sexual activity: Yes   Other Topics Concern  . Not on file   Social History Narrative  . No narrative on file  BP 90/64   Pulse 63   Ht 6' (1.829 m)   Wt 159 lb 9.6 oz (72.4 kg)   BMI 21.65 kg/m   Physical Exam:  Well appearing middle aged man, NAD HEENT: Unremarkable Neck:  6 cm JVD, no thyromegally Back:  No CVA tenderness Lungs:  Clear with no wheezes HEART:  Regular brady rhythm, no murmurs, no rubs, no clicks Abd:  soft, positive bowel sounds, no organomegally, no rebound, no guarding Ext:  2 plus pulses, no edema, no cyanosis, no clubbing Skin:  No rashes no nodules Neuro:  CN II through XII intact, motor grossly intact  EKG - sinus bradycardia,QRS 130 ms. RBBB   Assess/Plan: 1. PVC's - his symptoms are well controlled. He will take an additional half tablet of flecainide as needed. 2. Sob - he has had a couple of episodes but this is mostly improved. 3. Syncope - he has had no recurrent episodes.   Mikle Bosworth.D.

## 2016-09-10 ENCOUNTER — Ambulatory Visit (INDEPENDENT_AMBULATORY_CARE_PROVIDER_SITE_OTHER): Payer: Medicare Other

## 2016-09-10 VITALS — BP 92/62 | HR 49 | Temp 97.9°F | Ht 71.5 in | Wt 157.5 lb

## 2016-09-10 DIAGNOSIS — R0602 Shortness of breath: Secondary | ICD-10-CM

## 2016-09-10 DIAGNOSIS — Z1159 Encounter for screening for other viral diseases: Secondary | ICD-10-CM

## 2016-09-10 DIAGNOSIS — K219 Gastro-esophageal reflux disease without esophagitis: Secondary | ICD-10-CM | POA: Diagnosis not present

## 2016-09-10 DIAGNOSIS — Z0000 Encounter for general adult medical examination without abnormal findings: Secondary | ICD-10-CM | POA: Diagnosis not present

## 2016-09-10 DIAGNOSIS — Z8639 Personal history of other endocrine, nutritional and metabolic disease: Secondary | ICD-10-CM

## 2016-09-10 DIAGNOSIS — I493 Ventricular premature depolarization: Secondary | ICD-10-CM

## 2016-09-10 DIAGNOSIS — Z87898 Personal history of other specified conditions: Secondary | ICD-10-CM

## 2016-09-10 DIAGNOSIS — Z125 Encounter for screening for malignant neoplasm of prostate: Secondary | ICD-10-CM

## 2016-09-10 DIAGNOSIS — Z23 Encounter for immunization: Secondary | ICD-10-CM

## 2016-09-10 DIAGNOSIS — R42 Dizziness and giddiness: Secondary | ICD-10-CM

## 2016-09-10 LAB — CBC WITH DIFFERENTIAL/PLATELET
Basophils Absolute: 0 10*3/uL (ref 0.0–0.1)
Basophils Relative: 0.5 % (ref 0.0–3.0)
Eosinophils Absolute: 0.2 10*3/uL (ref 0.0–0.7)
Eosinophils Relative: 2.2 % (ref 0.0–5.0)
HCT: 46.3 % (ref 39.0–52.0)
Hemoglobin: 15.6 g/dL (ref 13.0–17.0)
Lymphocytes Relative: 19.9 % (ref 12.0–46.0)
Lymphs Abs: 1.4 10*3/uL (ref 0.7–4.0)
MCHC: 33.8 g/dL (ref 30.0–36.0)
MCV: 93.9 fl (ref 78.0–100.0)
Monocytes Absolute: 0.6 10*3/uL (ref 0.1–1.0)
Monocytes Relative: 7.9 % (ref 3.0–12.0)
Neutro Abs: 4.9 10*3/uL (ref 1.4–7.7)
Neutrophils Relative %: 69.5 % (ref 43.0–77.0)
Platelets: 154 10*3/uL (ref 150.0–400.0)
RBC: 4.93 Mil/uL (ref 4.22–5.81)
RDW: 13.2 % (ref 11.5–15.5)
WBC: 7 10*3/uL (ref 4.0–10.5)

## 2016-09-10 LAB — COMPREHENSIVE METABOLIC PANEL
ALT: 20 U/L (ref 0–53)
AST: 20 U/L (ref 0–37)
Albumin: 4.7 g/dL (ref 3.5–5.2)
Alkaline Phosphatase: 44 U/L (ref 39–117)
BUN: 16 mg/dL (ref 6–23)
CO2: 31 mEq/L (ref 19–32)
Calcium: 9.7 mg/dL (ref 8.4–10.5)
Chloride: 103 mEq/L (ref 96–112)
Creatinine, Ser: 0.93 mg/dL (ref 0.40–1.50)
GFR: 86.13 mL/min (ref >60.00)
Glucose, Bld: 77 mg/dL (ref 70–99)
Potassium: 4.3 mEq/L (ref 3.5–5.1)
Sodium: 140 mEq/L (ref 135–145)
Total Bilirubin: 1.3 mg/dL — ABNORMAL HIGH (ref 0.2–1.2)
Total Protein: 6.8 g/dL (ref 6.0–8.3)

## 2016-09-10 LAB — HEMOGLOBIN A1C: Hgb A1c MFr Bld: 5.3 % (ref 4.6–6.5)

## 2016-09-10 LAB — PSA, MEDICARE: PSA: 2.1 ng/ml (ref 0.10–4.00)

## 2016-09-10 LAB — CORTISOL: Cortisol, Plasma: 6.1 ug/dL

## 2016-09-10 NOTE — Patient Instructions (Signed)
Mr. Frattini , Thank you for taking time to come for your Medicare Wellness Visit. I appreciate your ongoing commitment to your health goals. Please review the following plan we discussed and let me know if I can assist you in the future.   These are the goals we discussed: Goals    . Increase physical activity          Starting 09/14/2016, I will attempt to exercise at least 30 min 3 days per week.        This is a list of the screening recommended for you and due dates:  Health Maintenance  Topic Date Due  . Shingles Vaccine  09/10/2017*  . Tetanus Vaccine  03/20/2019  . Colon Cancer Screening  09/03/2025  . Flu Shot  Completed  .  Hepatitis C: One time screening is recommended by Center for Disease Control  (CDC) for  adults born from [birth year] through 1967.   Completed  . Pneumonia vaccines  Completed  *Topic was postponed. The date shown is not the original due date.   Preventive Care for Adults  A healthy lifestyle and preventive care can promote health and wellness. Preventive health guidelines for adults include the following key practices.  . A routine yearly physical is a good way to check with your health care provider about your health and preventive screening. It is a chance to share any concerns and updates on your health and to receive a thorough exam.  . Visit your dentist for a routine exam and preventive care every 6 months. Brush your teeth twice a day and floss once a day. Good oral hygiene prevents tooth decay and gum disease.  . The frequency of eye exams is based on your age, health, family medical history, use  of contact lenses, and other factors. Follow your health care provider's ecommendations for frequency of eye exams.  . Eat a healthy diet. Foods like vegetables, fruits, whole grains, low-fat dairy products, and lean protein foods contain the nutrients you need without too many calories. Decrease your intake of foods high in solid fats, added sugars, and  salt. Eat the right amount of calories for you. Get information about a proper diet from your health care provider, if necessary.  . Regular physical exercise is one of the most important things you can do for your health. Most adults should get at least 150 minutes of moderate-intensity exercise (any activity that increases your heart rate and causes you to sweat) each week. In addition, most adults need muscle-strengthening exercises on 2 or more days a week.  Silver Sneakers may be a benefit available to you. To determine eligibility, you may visit the website: www.silversneakers.com or contact program at [phone] Mon-Fri between 8AM-8PM.   . Maintain a healthy weight. The body mass index (BMI) is a screening tool to identify possible weight problems. It provides an estimate of body fat based on height and weight. Your health care provider can find your BMI and can help you achieve or maintain a healthy weight.   For adults 20 years and older: ? A BMI below 18.5 is considered underweight. ? A BMI of 18.5 to 24.9 is normal. ? A BMI of 25 to 29.9 is considered overweight. ? A BMI of 30 and above is considered obese.   . Maintain normal blood lipids and cholesterol levels by exercising and minimizing your intake of saturated fat. Eat a balanced diet with plenty of fruit and vegetables. Blood tests for lipids and cholesterol  should begin at age 58 and be repeated every 5 years. If your lipid or cholesterol levels are high, you are over 50, or you are at high risk for heart disease, you may need your cholesterol levels checked more frequently. Ongoing high lipid and cholesterol levels should be treated with medicines if diet and exercise are not working.  . If you smoke, find out from your health care provider how to quit. If you do not use tobacco, please do not start.  . If you choose to drink alcohol, please do not consume more than 2 drinks per day. One drink is considered to be 12 ounces  (355 mL) of beer, 5 ounces (148 mL) of wine, or 1.5 ounces (44 mL) of liquor.  . If you are 39-70 years old, ask your health care provider if you should take aspirin to prevent strokes.  . Use sunscreen. Apply sunscreen liberally and repeatedly throughout the day. You should seek shade when your shadow is shorter than you. Protect yourself by wearing long sleeves, pants, a wide-brimmed hat, and sunglasses year round, whenever you are outdoors.  . Once a month, do a whole body skin exam, using a mirror to look at the skin on your back. Tell your health care provider of new moles, moles that have irregular borders, moles that are larger than a pencil eraser, or moles that have changed in shape or color.

## 2016-09-10 NOTE — Progress Notes (Signed)
Pre visit review using our clinic review tool, if applicable. No additional management support is needed unless otherwise documented below in the visit note. 

## 2016-09-10 NOTE — Progress Notes (Signed)
Subjective:   Jacob Marsh is a 65 y.o. male who presents for Medicare Annual/Subsequent preventive examination.  Review of Systems:  N/A Cardiac Risk Factors include: advanced age (>2men, >63 women);male gender     Objective:    Vitals: BP 92/62 (BP Location: Left Arm, Patient Position: Sitting, Cuff Size: Normal)   Pulse (!) 49   Temp 97.9 F (36.6 C) (Oral)   Ht 5' 11.5" (1.816 m) Comment: no shoes  Wt 157 lb 8 oz (71.4 kg)   SpO2 98%   BMI 21.66 kg/m   Body mass index is 21.66 kg/m.  Tobacco History  Smoking Status  . Never Smoker  Smokeless Tobacco  . Never Used     Counseling given: No   Past Medical History:  Diagnosis Date  . Depression   . Diverticulosis    History of diverticulosis// flex sig 2006//colonoscopy 01/21/2000 diverticulosis  . GERD (gastroesophageal reflux disease)   . Hx of echocardiogram    Echo (9/14): EF XX123456, grade 1 diastolic dysfunction, trivial AI, trivial MR, MAC, PASP 29  . PVC (premature ventricular contraction)   . Trigger finger of both hands    Past Surgical History:  Procedure Laterality Date  . DUPUYTREN CONTRACTURE RELEASE    . TONSILLECTOMY    . WISDOM TOOTH EXTRACTION     Family History  Problem Relation Age of Onset  . Arthritis Mother   . Heart disease Mother     ? atrial fib  . Depression Sister   . Colon polyps Father   . Cancer Maternal Aunt     ?colon cancer  . Heart disease Maternal Grandmother     CAD  . Cancer Maternal Grandmother     liver cancer  . Heart disease Maternal Grandfather     CAD  . Colon cancer Paternal Grandmother   . Sudden death Neg Hx    History  Sexual Activity  . Sexual activity: Yes    Outpatient Encounter Prescriptions as of 09/10/2016  Medication Sig  . flecainide (TAMBOCOR) 100 MG tablet Take 1 tablet by mouth twice daily and okay to take 1/2 tablet by mouth midday as needed for palpitations  . Melatonin 5 MG CAPS Take 10 mg by mouth at bedtime. Melatonin  XR  . metoprolol tartrate (LOPRESSOR) 25 MG tablet TAKE 1/2 TABLET(12.5 MG) BY MOUTH AT BEDTIME  . Multiple Vitamin (MULTIVITAMIN WITH MINERALS) TABS Take 1 tablet by mouth 2 (two) times daily.   . Omega-3 Fatty Acids (FISH OIL PO) Take 1 capsule by mouth daily.   . pantoprazole (PROTONIX) 40 MG tablet Take 1 tablet by mouth daily. For acid reflux  . ranitidine (ZANTAC) 75 MG tablet Take 75 mg by mouth at bedtime.    No facility-administered encounter medications on file as of 09/10/2016.     Activities of Daily Living In your present state of health, do you have any difficulty performing the following activities: 09/10/2016  Hearing? N  Vision? N  Difficulty concentrating or making decisions? N  Walking or climbing stairs? N  Dressing or bathing? N  Doing errands, shopping? N  Preparing Food and eating ? N  Using the Toilet? N  In the past six months, have you accidently leaked urine? N  Do you have problems with loss of bowel control? N  Managing your Medications? N  Managing your Finances? N  Housekeeping or managing your Housekeeping? N  Some recent data might be hidden    Patient Care Team: [facility]  Tower, MD as PCP - General Josue Hector, MD as Consulting Physician (Cardiology) Evans Lance, MD as Consulting Physician (Cardiology) Jola Schmidt, MD as Consulting Physician (Ophthalmology)   Assessment:     Hearing Screening   125Hz  250Hz  500Hz  1000Hz  2000Hz  3000Hz  4000Hz  6000Hz  8000Hz   Right ear:   40 40 40  40    Left ear:   40 40 40  40    Vision Screening Comments: Last vision exam in 2017 with Dr. Valetta Close [facility])   Exercise Activities and Dietary recommendations Current Exercise Habits: Home exercise routine, Type of exercise: stretching;strength training/weights, Frequency (Times/Week): 4, Intensity: Moderate, Exercise limited by: None identified  Goals    . Increase physical activity          Starting 09/14/2016, I will attempt to exercise at  least 30 min 3 days per week.       Fall Risk Fall Risk  09/10/2016 12/11/2014  Falls in the past year? No Yes  Number falls in past yr: - 1   Depression Screen PHQ 2/9 Scores 09/10/2016 12/11/2014  PHQ - 2 Score 0 0    Cognitive Function MMSE - Mini Mental State Exam 09/10/2016  Orientation to time 5  Orientation to Place 5  Registration 3  Attention/ Calculation 0  Recall 3  Language- name 2 objects 0  Language- repeat 1  Language- follow 3 step command 3  Language- read & follow direction 0  Write a sentence 0  Copy design 0  Total score 20     PLEASE NOTE: A Mini-Cog screen was completed. Maximum score is 20. A value of 0 denotes this part of Folstein MMSE was not completed or the patient failed this part of the Mini-Cog screening.   Mini-Cog Screening Orientation to Time - Max 5 pts Orientation to Place - Max 5 pts Registration - Max 3 pts Recall - Max 3 pts Language Repeat - Max 1 pts Language Follow 3 Step Command - Max 3 pts     Immunization History  Administered Date(s) Administered  . Influenza Split 08/17/2012  . Influenza,inj,Quad PF,36+ Mos 12/11/2014, 09/10/2016  . Pneumococcal Conjugate-13 09/10/2016  . Pneumococcal Polysaccharide-23 12/11/2014  . Td 09/26/1999, 03/19/2009   Screening Tests Health Maintenance  Topic Date Due  . ZOSTAVAX  09/10/2017 (Originally 08/30/2009)  . TETANUS/TDAP  03/20/2019  . COLONOSCOPY  09/03/2025  . INFLUENZA VACCINE  Completed  . Hepatitis C Screening  Completed  . PNA vac Low Risk Adult  Completed      Plan:     I have personally reviewed and addressed the Medicare Annual Wellness questionnaire and have noted the following in the patient's chart:  A. Medical and social history B. Use of alcohol, tobacco or illicit drugs  C. Current medications and supplements D. Functional ability and status E.  Nutritional status F.  Physical activity G. Advance directives H. List of other physicians I.    Hospitalizations, surgeries, and ER visits in previous 12 months J.  [place] to include hearing, vision, cognitive, depression L. Referrals and appointments - none  In addition, I have reviewed and discussed with patient certain preventive protocols, quality metrics, and best practice recommendations. A written personalized care plan for preventive services as well as general preventive health recommendations were provided to patient.  See attached scanned questionnaire for additional information.   Signed,   Lindell Noe, MHA, BS, LPN Health Coach

## 2016-09-10 NOTE — Progress Notes (Signed)
PCP notes:   Health maintenance:  Flu vaccine -administered PCV13 - administered Hep C screening- completed Shingles - questionable hx of whether pt had chicken pox during childhood; pt will research  Abnormal screenings:   None  Patient concerns:   None  Nurse concerns:  None  Next PCP appt:   09/15/16 @ 0830

## 2016-09-11 LAB — HEPATITIS C ANTIBODY: HCV Ab: NEGATIVE

## 2016-09-13 NOTE — Progress Notes (Signed)
I reviewed health advisor's note, was available for consultation, and agree with documentation and plan.  

## 2016-09-15 ENCOUNTER — Ambulatory Visit (INDEPENDENT_AMBULATORY_CARE_PROVIDER_SITE_OTHER): Payer: Medicare Other | Admitting: Family Medicine

## 2016-09-15 ENCOUNTER — Encounter: Payer: Self-pay | Admitting: Family Medicine

## 2016-09-15 VITALS — BP 98/62 | HR 48 | Temp 97.5°F | Ht 71.5 in | Wt 159.5 lb

## 2016-09-15 DIAGNOSIS — I493 Ventricular premature depolarization: Secondary | ICD-10-CM

## 2016-09-15 DIAGNOSIS — R350 Frequency of micturition: Secondary | ICD-10-CM

## 2016-09-15 DIAGNOSIS — N401 Enlarged prostate with lower urinary tract symptoms: Secondary | ICD-10-CM | POA: Diagnosis not present

## 2016-09-15 DIAGNOSIS — N400 Enlarged prostate without lower urinary tract symptoms: Secondary | ICD-10-CM

## 2016-09-15 DIAGNOSIS — Z1159 Encounter for screening for other viral diseases: Secondary | ICD-10-CM

## 2016-09-15 DIAGNOSIS — Z125 Encounter for screening for malignant neoplasm of prostate: Secondary | ICD-10-CM

## 2016-09-15 DIAGNOSIS — Z13220 Encounter for screening for lipoid disorders: Secondary | ICD-10-CM

## 2016-09-15 HISTORY — DX: Benign prostatic hyperplasia without lower urinary tract symptoms: N40.0

## 2016-09-15 NOTE — Assessment & Plan Note (Signed)
Lab Results  Component Value Date   PSA 2.10 09/10/2016   PSA 1.29 12/04/2014   PSA 1.68 08/10/2012   Some symptoms of mild BPH  Exam unremarkable Continue to follow

## 2016-09-15 NOTE — Assessment & Plan Note (Signed)
Last lipids were good  Disc goals for lipids and reasons to control them Rev labs with pt (from last draw)  Rev low sat fat diet in detail

## 2016-09-15 NOTE — Patient Instructions (Addendum)
If you are interested in a shingles/zoster vaccine - call your insurance to check on coverage,( you should not get it within 1 month of other vaccines) , then call us for a prescription  for it to take to a pharmacy that gives the shot , or make a nurse visit to get it here depending on your coverage Take care of yourself  Get outdoors when you can Exercise when you can  Labs look good/stable

## 2016-09-15 NOTE — Progress Notes (Signed)
Subjective:    Patient ID: Jacob Marsh, male    DOB: 11-Aug-1951, 65 y.o.   MRN: [id number]  HPI  Here for annual f/u of chronic medical problems   Still pretty busy/not working quite as much  2 jobs and wife with alz   He wishes he had time to exercise more  Is sleeping better -tried a melatonin extended release and it helps  Wt Readings from Last 3 Encounters:  09/15/16 159 lb 8 oz (72.3 kg)  09/10/16 157 lb 8 oz (71.4 kg)  09/09/16 159 lb 9.6 oz (72.4 kg)   bmi of 21.9  Great bp- low normal range  BP Readings from Last 3 Encounters:  09/15/16 98/62  09/10/16 92/62  09/09/16 90/64    Has been to cardiology and doing well  No more dizzy spells unless he gets dehydrated  Heart rhythm is better- takes tambocor and metoprolol   Getting a balanced diet - eats a healthy diet   Had AMW with lesia  Flu vaccine and PCV 13 given  Hep c screen done   Shingles vaccine -will check on coverage   Colonoscopy 11/16- nl with 10 year recall   Prostate cancer screening  Lab Results  Component Value Date   PSA 2.10 09/10/2016   PSA 1.29 12/04/2014   PSA 1.68 08/10/2012   starting to note more frequency of urination  Now and then he will leak a bit (generally when he has urgency) He drinks lots of water so that may be why- his bladder stays full  Nocturia -once if any  (very rarely more than that)   No family members with prostate cancer     Results for orders placed or performed in visit on 09/10/16  Hepatitis C antibody  Result Value Ref Range   HCV Ab NEGATIVE NEGATIVE  PSA, Medicare  Result Value Ref Range   PSA 2.10 0.10 - 4.00 ng/ml  Comprehensive metabolic panel  Result Value Ref Range   Sodium 140 135 - 145 mEq/L   Potassium 4.3 3.5 - 5.1 mEq/L   Chloride 103 96 - 112 mEq/L   CO2 31 19 - 32 mEq/L   Glucose, Bld 77 70 - 99 mg/dL   BUN 16 6 - 23 mg/dL   Creatinine, Ser 0.93 0.40 - 1.50 mg/dL   Total Bilirubin 1.3 (H) 0.2 - 1.2 mg/dL   Alkaline  Phosphatase 44 39 - 117 U/L   AST 20 0 - 37 U/L   ALT 20 0 - 53 U/L   Total Protein 6.8 6.0 - 8.3 g/dL   Albumin 4.7 3.5 - 5.2 g/dL   Calcium 9.7 8.4 - 10.5 mg/dL   GFR 86.13 >60.00 mL/min  CBC with Differential/Platelet  Result Value Ref Range   WBC 7.0 4.0 - 10.5 K/uL   RBC 4.93 4.22 - 5.81 Mil/uL   Hemoglobin 15.6 13.0 - 17.0 g/dL   HCT 46.3 39.0 - 52.0 %   MCV 93.9 78.0 - 100.0 fl   MCHC 33.8 30.0 - 36.0 g/dL   RDW 13.2 11.5 - 15.5 %   Platelets 154.0 150.0 - 400.0 K/uL   Neutrophils Relative % 69.5 43.0 - 77.0 %   Lymphocytes Relative 19.9 12.0 - 46.0 %   Monocytes Relative 7.9 3.0 - 12.0 %   Eosinophils Relative 2.2 0.0 - 5.0 %   Basophils Relative 0.5 0.0 - 3.0 %   Neutro Abs 4.9 1.4 - 7.7 K/uL   Lymphs Abs 1.4 0.7 -  4.0 K/uL   Monocytes Absolute 0.6 0.1 - 1.0 K/uL   Eosinophils Absolute 0.2 0.0 - 0.7 K/uL   Basophils Absolute 0.0 0.0 - 0.1 K/uL  Hemoglobin A1c  Result Value Ref Range   Hgb A1c MFr Bld 5.3 4.6 - 6.5 %  Cortisol  Result Value Ref Range   Cortisol, Plasma 6.1 ug/dL    Lab Results  Component Value Date   CHOL 162 12/04/2014   HDL 47.90 12/04/2014   LDLCALC 97 12/04/2014   TRIG 84.0 12/04/2014   CHOLHDL 3 12/04/2014    Patient Active Problem List   Diagnosis Date Noted  . BPH (benign prostatic hyperplasia) 09/15/2016  . Need for hepatitis C screening test 09/07/2016  . Encounter for Medicare annual wellness exam 12/11/2014  . GERD (gastroesophageal reflux disease) 12/11/2014  . Screening for lipoid disorders 12/03/2014  . Hoarse 07/17/2014  . Globus sensation 07/17/2014  . Syncope 04/24/2014  . PVCs (premature ventricular contractions) 03/20/2014  . Postural dizziness 03/20/2014  . Palpitations 08/10/2013  . History of chest pain 08/17/2012  . Routine general medical examination at a health care facility 08/09/2012  . Prostate cancer screening 08/09/2012   Past Medical History:  Diagnosis Date  . Depression   . Diverticulosis     History of diverticulosis// flex sig 2006//colonoscopy 01/21/2000 diverticulosis  . GERD (gastroesophageal reflux disease)   . Hx of echocardiogram    Echo (9/14): EF XX123456, grade 1 diastolic dysfunction, trivial AI, trivial MR, MAC, PASP 29  . PVC (premature ventricular contraction)   . Trigger finger of both hands    Past Surgical History:  Procedure Laterality Date  . DUPUYTREN CONTRACTURE RELEASE    . TONSILLECTOMY    . WISDOM TOOTH EXTRACTION     Social History  Substance Use Topics  . Smoking status: Never Smoker  . Smokeless tobacco: Never Used  . Alcohol use 0.6 oz/week    1 Standard drinks or equivalent per week     Comment: social- 1-2 beer   Family History  Problem Relation Age of Onset  . Arthritis Mother   . Heart disease Mother     ? atrial fib  . Depression Sister   . Colon polyps Father   . Cancer Maternal Aunt     ?colon cancer  . Heart disease Maternal Grandmother     CAD  . Cancer Maternal Grandmother     liver cancer  . Heart disease Maternal Grandfather     CAD  . Colon cancer Paternal Grandmother   . Sudden death Neg Hx    No Known Allergies Current Outpatient Prescriptions on File Prior to Visit  Medication Sig Dispense Refill  . flecainide (TAMBOCOR) 100 MG tablet Take 1 tablet by mouth twice daily and okay to take 1/2 tablet by mouth midday as needed for palpitations 75 tablet 11  . Melatonin 5 MG CAPS Take 10 mg by mouth at bedtime. Melatonin XR    . metoprolol tartrate (LOPRESSOR) 25 MG tablet TAKE 1/2 TABLET(12.5 MG) BY MOUTH AT BEDTIME 45 tablet 3  . Multiple Vitamin (MULTIVITAMIN WITH MINERALS) TABS Take 1 tablet by mouth daily.     . Omega-3 Fatty Acids (FISH OIL PO) Take 1 capsule by mouth daily.     . pantoprazole (PROTONIX) 40 MG tablet Take 1 tablet by mouth daily. For acid reflux    . ranitidine (ZANTAC) 75 MG tablet Take 75 mg by mouth at bedtime.      No current facility-administered medications on  file prior to visit.       Review of Systems    Review of Systems  Constitutional: Negative for fever, appetite change, fatigue and unexpected weight change.  Eyes: Negative for pain and visual disturbance.  Respiratory: Negative for cough and shortness of breath.   Cardiovascular: Negative for cp or palpitations    Gastrointestinal: Negative for nausea, diarrhea and constipation.  Genitourinary: Negative for urgency and pos for frequency. neg for dysuria or hematuria  Skin: Negative for pallor or rash   Neurological: Negative for weakness, light-headedness, numbness and headaches.  Hematological: Negative for adenopathy. Does not bruise/bleed easily.  Psychiatric/Behavioral: Negative for dysphoric mood. The patient is not nervous/anxious.  pos for stressors     Objective:   Physical Exam  Constitutional: He is oriented to person, place, and time. He appears well-developed and well-nourished. No distress.  Slim and well appearing   HENT:  Head: Normocephalic and atraumatic.  Right Ear: External ear normal.  Left Ear: External ear normal.  Nose: Nose normal.  Mouth/Throat: Oropharynx is clear and moist.  Eyes: Conjunctivae and EOM are normal. Pupils are equal, round, and reactive to light. Right eye exhibits no discharge. Left eye exhibits no discharge. No scleral icterus.  Neck: Normal range of motion. Neck supple. No JVD present. Carotid bruit is not present. No thyromegaly present.  Cardiovascular: Normal rate, regular rhythm, normal heart sounds and intact distal pulses.  Exam reveals no gallop.   Pulmonary/Chest: Effort normal and breath sounds normal. No respiratory distress. He has no wheezes. He has no rales.  Abdominal: Soft. Bowel sounds are normal. He exhibits no distension, no abdominal bruit and no mass. There is no tenderness.  Genitourinary: Rectal exam shows no external hemorrhoid, no mass and no tenderness. Prostate is enlarged. Prostate is not tender.  Genitourinary Comments: Prostate size  is upper range of normal No asymmetry/bogginess/tenderness or nodules noted   Musculoskeletal: Normal range of motion. He exhibits no edema or tenderness.  Lymphadenopathy:    He has no cervical adenopathy.  Neurological: He is alert and oriented to person, place, and time. He has normal reflexes. No cranial nerve deficit. He exhibits normal muscle tone. Coordination normal.  Skin: Skin is warm and dry. No rash noted. No erythema. No pallor.  Fair complexion with lentigines   Psychiatric: He has a normal mood and affect.          Assessment & Plan:   Problem List Items Addressed This Visit      Cardiovascular and Mediastinum   PVCs (premature ventricular contractions)    Under the care of cardiology- doing well with metoprolol and flecanide  bp and pulse are stable        Genitourinary   BPH (benign prostatic hyperplasia)    Mild frequency  Nocturia generally not more than once  Big fluid drinker No concerns regarding DRE Lab Results  Component Value Date   PSA 2.10 09/10/2016   PSA 1.29 12/04/2014   PSA 1.68 08/10/2012    Will continue to follow          Other   Need for hepatitis C screening test    Negative screen       Prostate cancer screening - Primary    Lab Results  Component Value Date   PSA 2.10 09/10/2016   PSA 1.29 12/04/2014   PSA 1.68 08/10/2012   Some symptoms of mild BPH  Exam unremarkable Continue to follow       Screening for lipoid  disorders    Last lipids were good  Disc goals for lipids and reasons to control them Rev labs with pt (from last draw)  Rev low sat fat diet in detail

## 2016-09-15 NOTE — Assessment & Plan Note (Signed)
Mild frequency  Nocturia generally not more than once  Big fluid drinker No concerns regarding DRE Lab Results  Component Value Date   PSA 2.10 09/10/2016   PSA 1.29 12/04/2014   PSA 1.68 08/10/2012    Will continue to follow

## 2016-09-15 NOTE — Assessment & Plan Note (Signed)
Negative  screen!

## 2016-09-15 NOTE — Assessment & Plan Note (Signed)
Under the care of cardiology- doing well with metoprolol and flecanide  bp and pulse are stable

## 2016-09-15 NOTE — Progress Notes (Signed)
Pre visit review using our clinic review tool, if applicable. No additional management support is needed unless otherwise documented below in the visit note. 

## 2016-09-28 ENCOUNTER — Other Ambulatory Visit: Payer: Self-pay | Admitting: Internal Medicine

## 2016-11-26 ENCOUNTER — Telehealth: Payer: Self-pay

## 2016-11-26 MED ORDER — OSELTAMIVIR PHOSPHATE 75 MG PO CAPS: 75.0000 mg | ORAL_CAPSULE | Freq: Two times a day (BID) | ORAL | 0 refills | Status: DC

## 2016-11-26 NOTE — Telephone Encounter (Signed)
PLEASE NOTE: All timestamps contained within this report are represented as [place] Standard Time. CONFIDENTIALTY NOTICE: This fax transmission is intended only for the addressee. It contains information that is legally privileged, confidential or otherwise protected from use or disclosure. If you are not the intended recipient, you are strictly prohibited from reviewing, disclosing, copying using or disseminating any of this information or taking any action in reliance on or regarding this information. If you have received this fax in error, please notify us immediately by telephone so that we can arrange for its return to [place]. Phone: [phone], Toll-Free: [phone], Fax: [phone] Page: 1 of 1 Call Id: [id number] [place] Day - Client Nonclinical Telephone Record [place] Day - Client Client Site [place] Physician Glori Bickers, [place] MD Contact Type Call Who Is Calling Patient / Member / Family / Caregiver Caller Name Jacob Marsh Phone Number [phone] Patient Name Jacob Marsh Call Type Message Only Information Provided Reason for Call Request for General Office Information Initial Comment Caller thinks he may have the flu and needs to see if a Rx could be called in for him instead of making an office appt. Additional Comment Call Closed By: Cydney Ok Transaction Date/Time: 11/26/2016 3:22:11 PM (ET)

## 2016-11-26 NOTE — Telephone Encounter (Signed)
tamiflu sent to pharmacy.  Pt notified. If worsening, rec come for evaluation.

## 2016-11-26 NOTE — Telephone Encounter (Signed)
I spoke with pt; pt started with symptoms last night; pts father was recently in hospital with the flu. Pt has non prod cough,S/T, sinus and ear pressure and runny nose. No wheezing,SOB, Fever,chills or aches yet; pt request tamiflu to [facility]; Dr Glori Bickers having computer issues; please advise. Pt did get the flu shot.

## 2017-03-29 ENCOUNTER — Other Ambulatory Visit: Payer: Self-pay | Admitting: Internal Medicine

## 2017-08-18 DIAGNOSIS — L814 Other melanin hyperpigmentation: Secondary | ICD-10-CM | POA: Diagnosis not present

## 2017-08-18 DIAGNOSIS — L570 Actinic keratosis: Secondary | ICD-10-CM | POA: Diagnosis not present

## 2017-08-18 DIAGNOSIS — L821 Other seborrheic keratosis: Secondary | ICD-10-CM | POA: Diagnosis not present

## 2017-08-18 DIAGNOSIS — D229 Melanocytic nevi, unspecified: Secondary | ICD-10-CM | POA: Diagnosis not present

## 2017-08-20 DIAGNOSIS — H2513 Age-related nuclear cataract, bilateral: Secondary | ICD-10-CM | POA: Diagnosis not present

## 2017-09-14 ENCOUNTER — Ambulatory Visit (INDEPENDENT_AMBULATORY_CARE_PROVIDER_SITE_OTHER): Payer: Medicare Other

## 2017-09-14 VITALS — BP 90/60 | HR 59 | Temp 97.7°F | Ht 71.5 in | Wt 159.2 lb

## 2017-09-14 DIAGNOSIS — I9589 Other hypotension: Secondary | ICD-10-CM | POA: Diagnosis not present

## 2017-09-14 DIAGNOSIS — Z13220 Encounter for screening for lipoid disorders: Secondary | ICD-10-CM | POA: Diagnosis not present

## 2017-09-14 DIAGNOSIS — Z23 Encounter for immunization: Secondary | ICD-10-CM

## 2017-09-14 DIAGNOSIS — Z125 Encounter for screening for malignant neoplasm of prostate: Secondary | ICD-10-CM

## 2017-09-14 DIAGNOSIS — Z8639 Personal history of other endocrine, nutritional and metabolic disease: Secondary | ICD-10-CM

## 2017-09-14 DIAGNOSIS — Z0000 Encounter for general adult medical examination without abnormal findings: Secondary | ICD-10-CM | POA: Diagnosis not present

## 2017-09-14 LAB — CBC WITH DIFFERENTIAL/PLATELET
Basophils Absolute: 0 10*3/uL (ref 0.0–0.1)
Basophils Relative: 0.5 % (ref 0.0–3.0)
Eosinophils Absolute: 0.1 10*3/uL (ref 0.0–0.7)
Eosinophils Relative: 1.5 % (ref 0.0–5.0)
HCT: 45.8 % (ref 39.0–52.0)
Hemoglobin: 15.1 g/dL (ref 13.0–17.0)
Lymphocytes Relative: 21.3 % (ref 12.0–46.0)
Lymphs Abs: 1.2 10*3/uL (ref 0.7–4.0)
MCHC: 32.9 g/dL (ref 30.0–36.0)
MCV: 97.5 fl (ref 78.0–100.0)
Monocytes Absolute: 0.6 10*3/uL (ref 0.1–1.0)
Monocytes Relative: 10.3 % (ref 3.0–12.0)
Neutro Abs: 3.8 10*3/uL (ref 1.4–7.7)
Neutrophils Relative %: 66.4 % (ref 43.0–77.0)
Platelets: 161 10*3/uL (ref 150.0–400.0)
RBC: 4.7 Mil/uL (ref 4.22–5.81)
RDW: 13.1 % (ref 11.5–15.5)
WBC: 5.8 10*3/uL (ref 4.0–10.5)

## 2017-09-14 LAB — COMPREHENSIVE METABOLIC PANEL
ALT: 21 U/L (ref 0–53)
AST: 23 U/L (ref 0–37)
Albumin: 4.2 g/dL (ref 3.5–5.2)
Alkaline Phosphatase: 45 U/L (ref 39–117)
BUN: 23 mg/dL (ref 6–23)
CO2: 31 mEq/L (ref 19–32)
Calcium: 9.7 mg/dL (ref 8.4–10.5)
Chloride: 103 mEq/L (ref 96–112)
Creatinine, Ser: 0.9 mg/dL (ref 0.40–1.50)
GFR: 89.18 mL/min (ref >60.00)
Glucose, Bld: 88 mg/dL (ref 70–99)
Potassium: 4.7 mEq/L (ref 3.5–5.1)
Sodium: 139 mEq/L (ref 135–145)
Total Bilirubin: 0.8 mg/dL (ref 0.2–1.2)
Total Protein: 6.3 g/dL (ref 6.0–8.3)

## 2017-09-14 LAB — LIPID PANEL
Cholesterol: 183 mg/dL (ref 0–200)
HDL: 54.8 mg/dL (ref >39.00)
LDL Cholesterol: 118 mg/dL — ABNORMAL HIGH (ref 0–99)
NonHDL: 127.97
Total CHOL/HDL Ratio: 3
Triglycerides: 51 mg/dL (ref 0.0–149.0)
VLDL: 10.2 mg/dL (ref 0.0–40.0)

## 2017-09-14 LAB — PSA, MEDICARE: PSA: 1.75 ng/ml (ref 0.10–4.00)

## 2017-09-14 LAB — HEMOGLOBIN A1C: Hgb A1c MFr Bld: 5.4 % (ref 4.6–6.5)

## 2017-09-14 NOTE — Progress Notes (Signed)
Pre visit review using our clinic review tool, if applicable. No additional management support is needed unless otherwise documented below in the visit note. 

## 2017-09-14 NOTE — Progress Notes (Signed)
Subjective:   Jacob Marsh is a 66 y.o. male who presents for Medicare Annual/Subsequent preventive examination.  Review of Systems:  N/A Cardiac Risk Factors include: advanced age (>32mn, >>76women);male gender     Objective:    Vitals: BP 90/60 (BP Location: Right Arm, Patient Position: Sitting, Cuff Size: Normal)   Pulse (!) 59   Temp 97.7 F (36.5 C) (Oral)   Ht 5' 11.5" (1.816 m) Comment: no shoes  Wt 159 lb 4 oz (72.2 kg)   SpO2 98%   BMI 21.90 kg/m   Body mass index is 21.9 kg/m.  Tobacco Social History   Tobacco Use  Smoking Status Never Smoker  Smokeless Tobacco Never Used     Counseling given: No   Past Medical History:  Diagnosis Date  . Depression   . Diverticulosis    History of diverticulosis// flex sig 2006//colonoscopy 01/21/2000 diverticulosis  . GERD (gastroesophageal reflux disease)   . Hx of echocardiogram    Echo (9/14): EF 527% grade 1 diastolic dysfunction, trivial AI, trivial MR, MAC, PASP 29  . PVC (premature ventricular contraction)   . Trigger finger of both hands    Past Surgical History:  Procedure Laterality Date  . DUPUYTREN CONTRACTURE RELEASE    . TONSILLECTOMY    . WISDOM TOOTH EXTRACTION     Family History  Problem Relation Age of Onset  . Arthritis Mother   . Heart disease Mother        ? atrial fib  . Depression Sister   . Colon polyps Father   . Cancer Maternal Aunt        ?colon cancer  . Heart disease Maternal Grandmother        CAD  . Cancer Maternal Grandmother        liver cancer  . Heart disease Maternal Grandfather        CAD  . Colon cancer Paternal Grandmother   . Sudden death Neg Hx    Social History   Substance and Sexual Activity  Sexual Activity Yes    Outpatient Encounter Medications as of 09/14/2017  Medication Sig  . flecainide (TAMBOCOR) 100 MG tablet TAKE 1 TABLET BY MOUTH TWICE DAILY, OKAY TO TAKE 1/2 TABLET AT MIDDAY AS NEEDED FOR PALPITATIONS  . Melatonin 5 MG CAPS Take  10 mg by mouth at bedtime. Melatonin XR  . metoprolol tartrate (LOPRESSOR) 25 MG tablet TAKE 1/2 TABLET(12.5 MG) BY MOUTH AT BEDTIME  . Multiple Vitamin (MULTIVITAMIN WITH MINERALS) TABS Take 1 tablet by mouth daily.   . Omega-3 Fatty Acids (FISH OIL PO) Take 1 capsule by mouth daily.   . pantoprazole (PROTONIX) 40 MG tablet Take 1 tablet as needed by mouth. For acid reflux  . ranitidine (ZANTAC) 75 MG tablet Take 75 mg by mouth at bedtime.   . TURMERIC PO Take 400 mg daily by mouth.  .Marland KitchenUNABLE TO FIND Med Name: Blueberry powder 2 tsp by mouth daily + real blueberries 1/2 cup by mouth daily  . [DISCONTINUED] oseltamivir (TAMIFLU) 75 MG capsule Take 1 capsule (75 mg total) by mouth 2 (two) times daily.   No facility-administered encounter medications on file as of 09/14/2017.     Activities of Daily Living In your present state of health, do you have any difficulty performing the following activities: 09/14/2017  Hearing? N  Vision? N  Difficulty concentrating or making decisions? N  Walking or climbing stairs? N  Dressing or bathing? N  Doing errands, shopping? N  Preparing Food and eating ? N  Using the Toilet? N  In the past six months, have you accidently leaked urine? N  Do you have problems with loss of bowel control? N  Managing your Medications? N  Managing your Finances? N  Housekeeping or managing your Housekeeping? N  Some recent data might be hidden    Patient Care Team: Tower, Wynelle Fanny, MD as PCP - General Josue Hector, MD as Consulting Physician (Cardiology) Evans Lance, MD as Consulting Physician (Cardiology) Jola Schmidt, MD as Consulting Physician (Ophthalmology) Druscilla Brownie, MD as Consulting Physician (Dermatology) Vinnie Level, DDS, PA as Referring Physician (Dentistry)   Assessment:     Hearing Screening   125Hz  250Hz  500Hz  1000Hz  2000Hz  3000Hz  4000Hz  6000Hz  8000Hz   Right ear:   40 40 40  40    Left ear:   40 40 40  40    Vision Screening  Comments: Last vision exam approx. 1 mth ago with Dr. Valetta Close   Exercise Activities and Dietary recommendations Current Exercise Habits: Home exercise routine, Type of exercise: stretching;strength training/weights(weights 20 min 3x week; stretching 30 min daily), Time (Minutes): 30, Frequency (Times/Week): 7, Weekly Exercise (Minutes/Week): 210, Intensity: Mild, Exercise limited by: None identified  Goals    . Increase physical activity     Starting 09/14/2017, I will continue to do stretching for at least 30 min 5 days per week and to do weights for 20 min 3 days per week.       Fall Risk Fall Risk  09/14/2017 09/10/2016 12/11/2014  Falls in the past year? No No Yes  Number falls in past yr: - - 1   Depression Screen PHQ 2/9 Scores 09/14/2017 09/10/2016 12/11/2014  PHQ - 2 Score 0 0 0  PHQ- 9 Score 2 - -    Cognitive Function MMSE - Mini Mental State Exam 09/14/2017 09/10/2016  Orientation to time 5 5  Orientation to Place 5 5  Registration 3 3  Attention/ Calculation 0 0  Recall 3 3  Language- name 2 objects 0 0  Language- repeat 1 1  Language- follow 3 step command 3 3  Language- read & follow direction 0 0  Write a sentence 0 0  Copy design 0 0  Total score 20 20     PLEASE NOTE: A Mini-Cog screen was completed. Maximum score is 20. A value of 0 denotes this part of Folstein MMSE was not completed or the patient failed this part of the Mini-Cog screening.   Mini-Cog Screening Orientation to Time - Max 5 pts Orientation to Place - Max 5 pts Registration - Max 3 pts Recall - Max 3 pts Language Repeat - Max 1 pts Language Follow 3 Step Command - Max 3 pts     Immunization History  Administered Date(s) Administered  . Influenza Split 08/17/2012  . Influenza,inj,Quad PF,6+ Mos 12/11/2014, 09/10/2016, 09/14/2017  . Pneumococcal Conjugate-13 09/10/2016  . Pneumococcal Polysaccharide-23 12/11/2014  . Td 09/26/1999, 03/19/2009   Screening Tests Health Maintenance    Topic Date Due  . TETANUS/TDAP  03/20/2019  . COLONOSCOPY  09/03/2025  . INFLUENZA VACCINE  Completed  . Hepatitis C Screening  Completed  . PNA vac Low Risk Adult  Completed      Plan:     I have personally reviewed, addressed, and noted the following in the patient's chart:  A. Medical and social history B. Use of alcohol, tobacco or illicit drugs  C. Current medications and supplements D. Functional  ability and status E.  Nutritional status F.  Physical activity G. Advance directives H. List of other physicians I.  Hospitalizations, surgeries, and ER visits in previous 12 months J.  [place] to include hearing, vision, cognitive, depression L. Referrals and appointments - none  In addition, I have reviewed and discussed with patient certain preventive protocols, quality metrics, and best practice recommendations. A written personalized care plan for preventive services as well as general preventive health recommendations were provided to patient.  See attached scanned questionnaire for additional information.   Signed,   Lindell Noe, MHA, BS, LPN Health Coach

## 2017-09-14 NOTE — Patient Instructions (Signed)
Jacob Marsh , Thank you for taking time to come for your Medicare Wellness Visit. I appreciate your ongoing commitment to your health goals. Please review the following plan we discussed and let me know if I can assist you in the future.   These are the goals we discussed: Goals    . Increase physical activity     Starting 09/14/2017, I will continue to do stretching for at least 30 min 5 days per week and to do weights for 20 min 3 days per week.        This is a list of the screening recommended for you and due dates:  Health Maintenance  Topic Date Due  . Tetanus Vaccine  03/20/2019  . Colon Cancer Screening  09/03/2025  . Flu Shot  Completed  .  Hepatitis C: One time screening is recommended by Center for Disease Control  (CDC) for  adults born from [birth year] through 1967.   Completed  . Pneumonia vaccines  Completed   Preventive Care for Adults  A healthy lifestyle and preventive care can promote health and wellness. Preventive health guidelines for adults include the following key practices.  . A routine yearly physical is a good way to check with your health care provider about your health and preventive screening. It is a chance to share any concerns and updates on your health and to receive a thorough exam.  . Visit your dentist for a routine exam and preventive care every 6 months. Brush your teeth twice a day and floss once a day. Good oral hygiene prevents tooth decay and gum disease.  . The frequency of eye exams is based on your age, health, family medical history, use  of contact lenses, and other factors. Follow your health care provider's recommendations for frequency of eye exams.  . Eat a healthy diet. Foods like vegetables, fruits, whole grains, low-fat dairy products, and lean protein foods contain the nutrients you need without too many calories. Decrease your intake of foods high in solid fats, added sugars, and salt. Eat the right amount of calories for you. Get  information about a proper diet from your health care provider, if necessary.  . Regular physical exercise is one of the most important things you can do for your health. Most adults should get at least 150 minutes of moderate-intensity exercise (any activity that increases your heart rate and causes you to sweat) each week. In addition, most adults need muscle-strengthening exercises on 2 or more days a week.  Silver Sneakers may be a benefit available to you. To determine eligibility, you may visit the website: www.silversneakers.com or contact program at [phone] Mon-Fri between 8AM-8PM.   . Maintain a healthy weight. The body mass index (BMI) is a screening tool to identify possible weight problems. It provides an estimate of body fat based on height and weight. Your health care provider can find your BMI and can help you achieve or maintain a healthy weight.   For adults 20 years and older: ? A BMI below 18.5 is considered underweight. ? A BMI of 18.5 to 24.9 is normal. ? A BMI of 25 to 29.9 is considered overweight. ? A BMI of 30 and above is considered obese.   . Maintain normal blood lipids and cholesterol levels by exercising and minimizing your intake of saturated fat. Eat a balanced diet with plenty of fruit and vegetables. Blood tests for lipids and cholesterol should begin at age 58 and be repeated every 5 years.  If your lipid or cholesterol levels are high, you are over 50, or you are at high risk for heart disease, you may need your cholesterol levels checked more frequently. Ongoing high lipid and cholesterol levels should be treated with medicines if diet and exercise are not working.  . If you smoke, find out from your health care provider how to quit. If you do not use tobacco, please do not start.  . If you choose to drink alcohol, please do not consume more than 2 drinks per day. One drink is considered to be 12 ounces (355 mL) of beer, 5 ounces (148 mL) of wine, or 1.5  ounces (44 mL) of liquor.  . If you are 36-74 years old, ask your health care provider if you should take aspirin to prevent strokes.  . Use sunscreen. Apply sunscreen liberally and repeatedly throughout the day. You should seek shade when your shadow is shorter than you. Protect yourself by wearing long sleeves, pants, a wide-brimmed hat, and sunglasses year round, whenever you are outdoors.  . Once a month, do a whole body skin exam, using a mirror to look at the skin on your back. Tell your health care provider of new moles, moles that have irregular borders, moles that are larger than a pencil eraser, or moles that have changed in shape or color.

## 2017-09-14 NOTE — Progress Notes (Signed)
PCP notes:   Health maintenance:  Flu vaccine - administered  Abnormal screenings:   Depression score: 2   Patient concerns:   None  Nurse concerns:  None  Next PCP appt:   10/05/17 @ 1200

## 2017-09-20 NOTE — Progress Notes (Signed)
I reviewed health advisor's note, was available for consultation, and agree with documentation and plan.  

## 2017-09-21 ENCOUNTER — Encounter: Payer: Medicare Other | Admitting: Family Medicine

## 2017-10-05 ENCOUNTER — Ambulatory Visit: Payer: Medicare Other | Admitting: Family Medicine

## 2017-10-20 DIAGNOSIS — F32A Depression, unspecified: Secondary | ICD-10-CM | POA: Insufficient documentation

## 2017-10-20 DIAGNOSIS — Z9289 Personal history of other medical treatment: Secondary | ICD-10-CM | POA: Insufficient documentation

## 2017-10-20 DIAGNOSIS — K5790 Diverticulosis of intestine, part unspecified, without perforation or abscess without bleeding: Secondary | ICD-10-CM | POA: Insufficient documentation

## 2017-10-20 DIAGNOSIS — IMO0002 Reserved for concepts with insufficient information to code with codable children: Secondary | ICD-10-CM | POA: Insufficient documentation

## 2017-10-20 DIAGNOSIS — F329 Major depressive disorder, single episode, unspecified: Secondary | ICD-10-CM | POA: Insufficient documentation

## 2017-10-27 ENCOUNTER — Ambulatory Visit: Payer: Medicare Other | Admitting: Family Medicine

## 2017-10-28 ENCOUNTER — Other Ambulatory Visit: Payer: Self-pay | Admitting: Internal Medicine

## 2017-11-08 ENCOUNTER — Encounter: Payer: Self-pay | Admitting: Internal Medicine

## 2017-11-08 ENCOUNTER — Ambulatory Visit (INDEPENDENT_AMBULATORY_CARE_PROVIDER_SITE_OTHER): Payer: Medicare Other | Admitting: Internal Medicine

## 2017-11-08 VITALS — BP 120/62 | HR 100 | Temp 97.6°F | Wt 161.0 lb

## 2017-11-08 DIAGNOSIS — J069 Acute upper respiratory infection, unspecified: Secondary | ICD-10-CM

## 2017-11-08 MED ORDER — AZITHROMYCIN 250 MG PO TABS: ORAL_TABLET | ORAL | 0 refills | Status: DC

## 2017-11-08 NOTE — Progress Notes (Signed)
HPI  Pt presents to the clinic today with c/o runny nose, nasal congestion, sore throat and cough. This started 1 month ago. He is blowing yellow/green mucous out of her nose. The cough is productive of green mucous. He denies fever, chills or body aches. He has tried Dayquil with some relief. He has no history of allergies. He has had sick contacts.  Review of Systems     Past Medical History:  Diagnosis Date  . Depression   . Diverticulosis    History of diverticulosis// flex sig 2006//colonoscopy 01/21/2000 diverticulosis  . GERD (gastroesophageal reflux disease)   . Hx of echocardiogram    Echo (9/14): EF 44%, grade 1 diastolic dysfunction, trivial AI, trivial MR, MAC, PASP 29  . PVC (premature ventricular contraction)   . Trigger finger of both hands     Family History  Problem Relation Age of Onset  . Arthritis Mother   . Heart disease Mother        ? atrial fib  . Depression Sister   . Colon polyps Father   . Cancer Maternal Aunt        ?colon cancer  . Heart disease Maternal Grandmother        CAD  . Cancer Maternal Grandmother        liver cancer  . Heart disease Maternal Grandfather        CAD  . Colon cancer Paternal Grandmother   . Sudden death Neg Hx     Social History   Socioeconomic History  . Marital status: Married    Spouse name: Not on file  . Number of children: Not on file  . Years of education: Not on file  . Highest education level: Not on file  Social Needs  . Financial resource strain: Not on file  . Food insecurity - worry: Not on file  . Food insecurity - inability: Not on file  . Transportation needs - medical: Not on file  . Transportation needs - non-medical: Not on file  Occupational History  . Not on file  Tobacco Use  . Smoking status: Never Smoker  . Smokeless tobacco: Never Used  Substance and Sexual Activity  . Alcohol use: Yes    Alcohol/week: 0.6 oz    Types: 1 Glasses of wine per week    Comment: social 1 beer once  monthly  . Drug use: No  . Sexual activity: Yes  Other Topics Concern  . Not on file  Social History Narrative  . Not on file    No Known Allergies   Constitutional:  Denies headache, fatigue, fever or abrupt weight changes.  HEENT:  Positive runny nose, nasal congestion and sore throat. Denies eye redness, ear pain, ringing in the ears, wax buildup. Respiratory: Positive cough. Denies difficulty breathing or shortness of breath.  Cardiovascular: Denies chest pain, chest tightness, palpitations or swelling in the hands or feet.   No other specific complaints in a complete review of systems (except as listed in HPI above).  Objective:   BP 120/62   Pulse 100   Temp 97.6 F (36.4 C) (Oral)   Wt 161 lb (73 kg)   SpO2 97%   BMI 22.14 kg/m   General: Appears his stated age, in NAD. HEENT: Head: normal shape and size, no sinus tenderness noted;  Ears: Tm's gray and intact, normal light reflex; Nose: mucosa boggy and moist, septum midline; Throat/Mouth: + PND. Teeth present, mucosa erythematous and moist, no exudate noted, no lesions or  ulcerations noted.  Neck:  No adenopathy noted.  Cardiovascular: Normal rate and rhythm. S1,S2 noted.  No murmur, rubs or gallops noted.  Pulmonary/Chest: Normal effort and positive vesicular breath sounds. No respiratory distress. No wheezes, rales or ronchi noted.       Assessment & Plan:   Upper Respiratory Infection:  Can try Allegra OTC Get some rest and drink plenty of fluids eRx for Azithromax x 5 days Can take Delsym or Nyquil for cough  RTC as needed or if symptoms persist. Webb Silversmith, NP

## 2017-11-08 NOTE — Patient Instructions (Signed)
Upper Respiratory Infection, Adult Most upper respiratory infections (URIs) are caused by a virus. A URI affects the nose, throat, and upper air passages. The most common type of URI is often called "the common cold." Follow these instructions at home:  Take medicines only as told by your doctor.  Gargle warm saltwater or take cough drops to comfort your throat as told by your doctor.  Use a warm mist humidifier or inhale steam from a shower to increase air moisture. This may make it easier to breathe.  Drink enough fluid to keep your pee (urine) clear or pale yellow.  Eat soups and other clear broths.  Have a healthy diet.  Rest as needed.  Go back to work when your fever is gone or your doctor says it is okay. ? You may need to stay home longer to avoid giving your URI to others. ? You can also wear a face mask and wash your hands often to prevent spread of the virus.  Use your inhaler more if you have asthma.  Do not use any tobacco products, including cigarettes, chewing tobacco, or electronic cigarettes. If you need help quitting, ask your doctor. Contact a doctor if:  You are getting worse, not better.  Your symptoms are not helped by medicine.  You have chills.  You are getting more short of breath.  You have brown or red mucus.  You have yellow or brown discharge from your nose.  You have pain in your face, especially when you bend forward.  You have a fever.  You have puffy (swollen) neck glands.  You have pain while swallowing.  You have white areas in the back of your throat. Get help right away if:  You have very bad or constant: ? Headache. ? Ear pain. ? Pain in your forehead, behind your eyes, and over your cheekbones (sinus pain). ? Chest pain.  You have long-lasting (chronic) lung disease and any of the following: ? Wheezing. ? Long-lasting cough. ? Coughing up blood. ? A change in your usual mucus.  You have a stiff neck.  You have  changes in your: ? Vision. ? Hearing. ? Thinking. ? Mood. This information is not intended to replace advice given to you by your health care provider. Make sure you discuss any questions you have with your health care provider. Document Released: 03/30/2008 Document Revised: 06/14/2016 Document Reviewed: 01/17/2014 Elsevier Interactive Patient Education  2018 [employer] Inc[employer]  

## 2017-11-09 ENCOUNTER — Ambulatory Visit (INDEPENDENT_AMBULATORY_CARE_PROVIDER_SITE_OTHER): Payer: Medicare Other | Admitting: Internal Medicine

## 2017-11-09 ENCOUNTER — Encounter: Payer: Self-pay | Admitting: Internal Medicine

## 2017-11-09 VITALS — BP 102/66 | HR 62 | Ht 72.0 in | Wt 156.0 lb

## 2017-11-09 DIAGNOSIS — R55 Syncope and collapse: Secondary | ICD-10-CM

## 2017-11-09 DIAGNOSIS — I493 Ventricular premature depolarization: Secondary | ICD-10-CM | POA: Diagnosis not present

## 2017-11-09 NOTE — Progress Notes (Signed)
HPI Mr. Greenleaf returns today for ongoing evaluation and management of his PVC's. He is a 67 yo man who has been treated for symptomatic PVC's for over 3 years. He has had a nice responsed with flecainide with only rare breakthroughs. He has not worn a repeat heart monitor. In the interim, he has done well with minimal palpitations. No chest pain or sob. No syncope. He would like to start exercising more. No Known Allergies   Current Outpatient Medications  Medication Sig Dispense Refill  . azithromycin (ZITHROMAX) 250 MG tablet Take 2 tabs today, then 1 tab daily x 4 days 6 tablet 0  . flecainide (TAMBOCOR) 100 MG tablet Take 100 mg by mouth 2 (two) times daily.    . Melatonin 5 MG CAPS Take 10 mg by mouth at bedtime. Melatonin XR    . metoprolol tartrate (LOPRESSOR) 25 MG tablet TAKE 1/2 TABLET(12.5 MG) BY MOUTH AT BEDTIME 45 tablet 3  . Multiple Vitamin (MULTIVITAMIN WITH MINERALS) TABS Take 1 tablet by mouth daily.     . Omega-3 Fatty Acids (FISH OIL PO) Take 1 capsule by mouth daily.     . ranitidine (ZANTAC) 75 MG tablet Take 75 mg by mouth at bedtime.     . TURMERIC PO Take 400 mg daily by mouth.    Marland Kitchen UNABLE TO FIND Med Name: Blueberry powder 2 tsp by mouth daily + real blueberries 1/2 cup by mouth daily     No current facility-administered medications for this visit.      Past Medical History:  Diagnosis Date  . Depression   . Diverticulosis    History of diverticulosis// flex sig 2006//colonoscopy 01/21/2000 diverticulosis  . GERD (gastroesophageal reflux disease)   . Hx of echocardiogram    Echo (9/14): EF 70%, grade 1 diastolic dysfunction, trivial AI, trivial MR, MAC, PASP 29  . PVC (premature ventricular contraction)   . Trigger finger of both hands     ROS:   All systems reviewed and negative except as noted in the HPI.   Past Surgical History:  Procedure Laterality Date  . DUPUYTREN CONTRACTURE RELEASE    . TONSILLECTOMY    . WISDOM TOOTH EXTRACTION         Family History  Problem Relation Age of Onset  . Arthritis Mother   . Heart disease Mother        ? atrial fib  . Depression Sister   . Colon polyps Father   . Cancer Maternal Aunt        ?colon cancer  . Heart disease Maternal Grandmother        CAD  . Cancer Maternal Grandmother        liver cancer  . Heart disease Maternal Grandfather        CAD  . Colon cancer Paternal Grandmother   . Sudden death Neg Hx      Social History   Socioeconomic History  . Marital status: Married    Spouse name: Not on file  . Number of children: Not on file  . Years of education: Not on file  . Highest education level: Not on file  Social Needs  . Financial resource strain: Not on file  . Food insecurity - worry: Not on file  . Food insecurity - inability: Not on file  . Transportation needs - medical: Not on file  . Transportation needs - non-medical: Not on file  Occupational History  . Not on file  Tobacco  Use  . Smoking status: Never Smoker  . Smokeless tobacco: Never Used  Substance and Sexual Activity  . Alcohol use: Yes    Alcohol/week: 0.6 oz    Types: 1 Glasses of wine per week    Comment: social 1 beer once monthly  . Drug use: No  . Sexual activity: Yes  Other Topics Concern  . Not on file  Social History Narrative  . Not on file     BP 102/66   Pulse 62   Ht 6' (1.829 m)   Wt 156 lb (70.8 kg)   BMI 21.16 kg/m   Physical Exam:  Well appearing NAD HEENT: Unremarkable Neck:  6 cm JVD, no thyromegally Lymphatics:  No adenopathy Back:  No CVA tenderness Lungs:  Clear with no wheezes HEART:  Regular rate rhythm, no murmurs, no rubs, no clicks Abd:  soft, positive bowel sounds, no organomegally, no rebound, no guarding Ext:  2 plus pulses, no edema, no cyanosis, no clubbing Skin:  No rashes no nodules Neuro:  CN II through XII intact, motor grossly intact  EKG - nsr with RBBB   Assess/Plan: 1. Palpitations - his symptoms are well controlled. He  will continue his current meds. 2. PVC's - he has tolerated flecainide very nicely. No indication for more monitoring at this time as he is clinically better.  Mikle Bosworth.D.

## 2017-11-09 NOTE — Patient Instructions (Signed)
Medication Instructions: Your physician recommends that you continue on your current medications as directed. Please refer to the Current Medication list given to you today.   Labwork: None ordered.   Testing/Procedures: None ordered.  Follow-Up: Your physician wants you to follow-up in: in One Year with Dr Lovena Le. You will receive a reminder letter in the mail two months in advance. If you don't receive a letter, please call our office to schedule the follow-up appointment.   Any Other Special Instructions Will Be Listed Below (If Applicable).     If you need a refill on your cardiac medications before your next appointment, please call your pharmacy.

## 2017-12-05 ENCOUNTER — Other Ambulatory Visit: Payer: Self-pay | Admitting: Internal Medicine

## 2017-12-12 ENCOUNTER — Other Ambulatory Visit: Payer: Self-pay | Admitting: Internal Medicine

## 2017-12-29 ENCOUNTER — Encounter: Payer: Self-pay | Admitting: Family Medicine

## 2017-12-29 ENCOUNTER — Ambulatory Visit (INDEPENDENT_AMBULATORY_CARE_PROVIDER_SITE_OTHER): Payer: Medicare Other | Admitting: Family Medicine

## 2017-12-29 VITALS — BP 98/62 | HR 99 | Temp 97.6°F | Ht 71.5 in | Wt 158.5 lb

## 2017-12-29 DIAGNOSIS — R350 Frequency of micturition: Secondary | ICD-10-CM

## 2017-12-29 DIAGNOSIS — M720 Palmar fascial fibromatosis [Dupuytren]: Secondary | ICD-10-CM | POA: Diagnosis not present

## 2017-12-29 DIAGNOSIS — Z125 Encounter for screening for malignant neoplasm of prostate: Secondary | ICD-10-CM

## 2017-12-29 DIAGNOSIS — F329 Major depressive disorder, single episode, unspecified: Secondary | ICD-10-CM

## 2017-12-29 DIAGNOSIS — N401 Enlarged prostate with lower urinary tract symptoms: Secondary | ICD-10-CM | POA: Diagnosis not present

## 2017-12-29 DIAGNOSIS — Z13220 Encounter for screening for lipoid disorders: Secondary | ICD-10-CM | POA: Diagnosis not present

## 2017-12-29 DIAGNOSIS — I493 Ventricular premature depolarization: Secondary | ICD-10-CM

## 2017-12-29 DIAGNOSIS — F32A Depression, unspecified: Secondary | ICD-10-CM

## 2017-12-29 HISTORY — DX: Palmar fascial fibromatosis (dupuytren): M72.0

## 2017-12-29 NOTE — Patient Instructions (Addendum)
If you are interested in the new shingles vaccine (Shingrix) - call your local pharmacy to check on coverage and availability    I will look into surgeon options for Dupuytren's   Take care of yourself  Keep eating well  Cut back on red meat

## 2017-12-29 NOTE — Progress Notes (Signed)
Subjective:    Patient ID: Jacob Marsh, male    DOB: 10-07-1951, 67 y.o.   MRN: [id number]  HPI  Here for annual f/u of chronic medical problems   A lot of family stuff going on  Wife has early alzheimers -caring for her (24 hour care) and also seizure disorder Cannot retire - paying down debt and medical expenses  Father -end of life in SNF Mother in asst living  Daughter got married last fall  Work is going ok - had to cut way back (enjoys his work)  Mood wise- doing ok  Self care -fair/not much time    Hard to get enough sleep  Stretching every day -not time for a lot more (no time for the gym)  Uses melatonin to sleep   Wt Readings from Last 3 Encounters:  12/29/17 158 lb 8 oz (71.9 kg)  11/09/17 156 lb (70.8 kg)  11/08/17 161 lb (73 kg)  thinks he eats enough -makes sure to get 5 g of protein for every 100 cal  Feels good for the most part  21.80 kg/m   Had amw on 11/20 Dep score was 2 Had flu vaccine   Colonoscopy 11/16 -diverticulosis  10y recall   Prostate health H/o BPH Nocturia times one (normal for him for years)  He limits too much fluid before bed  Lab Results  Component Value Date   PSA 1.75 09/14/2017   PSA 2.10 09/10/2016   PSA 1.29 12/04/2014    Zoster status -is interested in shingrix   Hx of palpitations/PVC On flecainide and metroprolol Thinks he is pretty well controlled  Sees cardiology yearly    BP Readings from Last 3 Encounters:  12/29/17 98/62  11/09/17 102/66  11/08/17 120/62   Pulse Readings from Last 3 Encounters:  12/29/17 99  11/09/17 62  11/08/17 100  pulse goes up in the doctor office  He takes 1/2 flecainide if needed if symptomatic     Lipid screening  Lab Results  Component Value Date   CHOL 183 09/14/2017   CHOL 162 12/04/2014   CHOL 170 08/10/2012   Lab Results  Component Value Date   HDL 54.80 09/14/2017   HDL 47.90 12/04/2014   HDL 43.80 08/10/2012   Lab Results  Component Value  Date   LDLCALC 118 (H) 09/14/2017   LDLCALC 97 12/04/2014   LDLCALC 106 (H) 08/10/2012   Lab Results  Component Value Date   TRIG 51.0 09/14/2017   TRIG 84.0 12/04/2014   TRIG 99.0 08/10/2012   Lab Results  Component Value Date   CHOLHDL 3 09/14/2017   CHOLHDL 3 12/04/2014   CHOLHDL 4 08/10/2012   No results found for: LDLDIRECT HDL is up and LDL is up mildly  Has been eating a handful of nuts  Red meat is 3 times per week    Other labs  Results for orders placed or performed in visit on 09/14/17  PSA, Medicare  Result Value Ref Range   PSA 1.75 0.10 - 4.00 ng/ml  Comprehensive metabolic panel  Result Value Ref Range   Sodium 139 135 - 145 mEq/L   Potassium 4.7 3.5 - 5.1 mEq/L   Chloride 103 96 - 112 mEq/L   CO2 31 19 - 32 mEq/L   Glucose, Bld 88 70 - 99 mg/dL   BUN 23 6 - 23 mg/dL   Creatinine, Ser 0.90 0.40 - 1.50 mg/dL   Total Bilirubin 0.8 0.2 - 1.2 mg/dL  Alkaline Phosphatase 45 39 - 117 U/L   AST 23 0 - 37 U/L   ALT 21 0 - 53 U/L   Total Protein 6.3 6.0 - 8.3 g/dL   Albumin 4.2 3.5 - 5.2 g/dL   Calcium 9.7 8.4 - 10.5 mg/dL   GFR 89.18 >60.00 mL/min  CBC with Differential/Platelet  Result Value Ref Range   WBC 5.8 4.0 - 10.5 K/uL   RBC 4.70 4.22 - 5.81 Mil/uL   Hemoglobin 15.1 13.0 - 17.0 g/dL   HCT 45.8 39.0 - 52.0 %   MCV 97.5 78.0 - 100.0 fl   MCHC 32.9 30.0 - 36.0 g/dL   RDW 13.1 11.5 - 15.5 %   Platelets 161.0 150.0 - 400.0 K/uL   Neutrophils Relative % 66.4 43.0 - 77.0 %   Lymphocytes Relative 21.3 12.0 - 46.0 %   Monocytes Relative 10.3 3.0 - 12.0 %   Eosinophils Relative 1.5 0.0 - 5.0 %   Basophils Relative 0.5 0.0 - 3.0 %   Neutro Abs 3.8 1.4 - 7.7 K/uL   Lymphs Abs 1.2 0.7 - 4.0 K/uL   Monocytes Absolute 0.6 0.1 - 1.0 K/uL   Eosinophils Absolute 0.1 0.0 - 0.7 K/uL   Basophils Absolute 0.0 0.0 - 0.1 K/uL  Hemoglobin A1c  Result Value Ref Range   Hgb A1c MFr Bld 5.4 4.6 - 6.5 %  Lipid Panel  Result Value Ref Range   Cholesterol 183 0  - 200 mg/dL   Triglycerides 51.0 0.0 - 149.0 mg/dL   HDL 54.80 >39.00 mg/dL   VLDL 10.2 0.0 - 40.0 mg/dL   LDL Cholesterol 118 (H) 0 - 99 mg/dL   Total CHOL/HDL Ratio 3    NonHDL 127.97     Lab Results  Component Value Date   TSH 3.99 12/04/2014     Dupuytren's is worse L 3rd finger  L 4th finger  Both significantly contracted  His surgeon retired   Patient Active Problem List   Diagnosis Date Noted  . Dupuytren contracture 12/29/2017  . Trigger finger of both hands   . PVC (premature ventricular contraction)   . Hx of echocardiogram   . Diverticulosis   . Depression   . BPH (benign prostatic hyperplasia) 09/15/2016  . Need for hepatitis C screening test 09/07/2016  . Encounter for Medicare annual wellness exam 12/11/2014  . Screening for lipoid disorders 12/03/2014  . Hoarse 07/17/2014  . Globus sensation 07/17/2014  . Syncope 04/24/2014  . PVCs (premature ventricular contractions) 03/20/2014  . Postural dizziness 03/20/2014  . Palpitations 08/10/2013  . History of chest pain 08/17/2012  . Routine general medical examination at a health care facility 08/09/2012  . Prostate cancer screening 08/09/2012   Past Medical History:  Diagnosis Date  . Depression   . Diverticulosis    History of diverticulosis// flex sig 2006//colonoscopy 01/21/2000 diverticulosis  . GERD (gastroesophageal reflux disease)   . Hx of echocardiogram    Echo (9/14): EF 58%, grade 1 diastolic dysfunction, trivial AI, trivial MR, MAC, PASP 29  . PVC (premature ventricular contraction)   . Trigger finger of both hands    Past Surgical History:  Procedure Laterality Date  . DUPUYTREN CONTRACTURE RELEASE    . TONSILLECTOMY    . WISDOM TOOTH EXTRACTION     Social History   Tobacco Use  . Smoking status: Never Smoker  . Smokeless tobacco: Never Used  Substance Use Topics  . Alcohol use: Yes    Alcohol/week: 0.6 oz  Types: 1 Glasses of wine per week    Comment: social 1 beer once  monthly  . Drug use: No   Family History  Problem Relation Age of Onset  . Arthritis Mother   . Heart disease Mother        ? atrial fib  . Depression Sister   . Colon polyps Father   . Cancer Maternal Aunt        ?colon cancer  . Heart disease Maternal Grandmother        CAD  . Cancer Maternal Grandmother        liver cancer  . Heart disease Maternal Grandfather        CAD  . Colon cancer Paternal Grandmother   . Sudden death Neg Hx    No Known Allergies Current Outpatient Medications on File Prior to Visit  Medication Sig Dispense Refill  . flecainide (TAMBOCOR) 100 MG tablet Take 1 tablet (100 mg total) by mouth 2 (two) times daily. 180 tablet 3  . Melatonin 5 MG CAPS Take 10 mg by mouth at bedtime. Melatonin XR    . metoprolol tartrate (LOPRESSOR) 25 MG tablet TAKE 1/2 TABLET(12.5 MG) BY MOUTH AT BEDTIME 45 tablet 3  . Multiple Vitamin (MULTIVITAMIN WITH MINERALS) TABS Take 1 tablet by mouth daily.     . Omega-3 Fatty Acids (FISH OIL PO) Take 1 capsule by mouth daily.     . ranitidine (ZANTAC) 75 MG tablet Take 75 mg by mouth at bedtime.     . TURMERIC PO Take 400 mg daily by mouth.    Marland Kitchen UNABLE TO FIND Med Name: Blueberry powder 2 tsp by mouth daily + real blueberries 1/2 cup by mouth daily     No current facility-administered medications on file prior to visit.     Review of Systems  Constitutional: Negative for activity change, appetite change, fatigue, fever and unexpected weight change.  HENT: Negative for congestion, rhinorrhea, sore throat and trouble swallowing.   Eyes: Negative for pain, redness, itching and visual disturbance.  Respiratory: Negative for cough, chest tightness, shortness of breath and wheezing.   Cardiovascular: Negative for chest pain and palpitations.  Gastrointestinal: Negative for abdominal pain, blood in stool, constipation, diarrhea and nausea.  Endocrine: Negative for cold intolerance, heat intolerance, polydipsia and polyuria.    Genitourinary: Negative for difficulty urinating, dysuria, frequency and urgency.  Musculoskeletal: Negative for arthralgias, joint swelling and myalgias.       Finger contractures from dupuytren's   Skin: Negative for pallor and rash.  Neurological: Negative for dizziness, tremors, weakness, numbness and headaches.  Hematological: Negative for adenopathy. Does not bruise/bleed easily.  Psychiatric/Behavioral: Negative for decreased concentration and dysphoric mood. The patient is not nervous/anxious.        Objective:   Physical Exam  Constitutional: He appears well-developed and well-nourished. No distress.  Slim and well appearing   HENT:  Head: Normocephalic and atraumatic.  Right Ear: External ear normal.  Left Ear: External ear normal.  Nose: Nose normal.  Mouth/Throat: Oropharynx is clear and moist.  Eyes: Conjunctivae and EOM are normal. Pupils are equal, round, and reactive to light. Right eye exhibits no discharge. Left eye exhibits no discharge. No scleral icterus.  Neck: Normal range of motion. Neck supple. No JVD present. Carotid bruit is not present. No thyromegaly present.  Cardiovascular: Regular rhythm, normal heart sounds and intact distal pulses. Exam reveals no gallop.  Rate in low 90s on exam  Pulmonary/Chest: Effort normal and breath sounds  normal. No respiratory distress. He has no wheezes. He exhibits no tenderness.  Abdominal: Soft. Bowel sounds are normal. He exhibits no distension, no abdominal bruit and no mass. There is no tenderness.  Musculoskeletal: He exhibits no edema or tenderness.  L 3rd/4th finger contractures from dupuytren's  No other joint changes   Lymphadenopathy:    He has no cervical adenopathy.  Neurological: He is alert. He has normal reflexes. No cranial nerve deficit. He exhibits normal muscle tone. Coordination normal.  Skin: Skin is warm and dry. No rash noted. No erythema. No pallor.  Fair complexion   Some lentigines    Psychiatric: He has a normal mood and affect.          Assessment & Plan:   Problem List Items Addressed This Visit      Cardiovascular and Mediastinum   PVCs (premature ventricular contractions)    Doing well with current cardiology care- beta blocker and flecainide  Pulse is up today-per pt this can be labile/ w/o symptoms         Digestive   RESOLVED: GERD (gastroesophageal reflux disease)     Musculoskeletal and Integument   Dupuytren contracture    L 3rd finger and 4th finger  His past hand surgeon retired-looking for a new specialist         Genitourinary   BPH (benign prostatic hyperplasia) - Primary    No changes  Lab Results  Component Value Date   PSA 1.75 09/14/2017   PSA 2.10 09/10/2016   PSA 1.29 12/04/2014           Other   Depression    Stable Pt thinks he is doing well given stressors/caregiving  Takes melatonin for sleep  Good self care but no time for exercise Reviewed stressors/ coping techniques/symptoms/ support sources/ tx options and side effects in detail today       Prostate cancer screening    No changes Nocturia times one No fam hx Lab Results  Component Value Date   PSA 1.75 09/14/2017   PSA 2.10 09/10/2016   PSA 1.29 12/04/2014         Screening for lipoid disorders    LDL is up slightly this time- but still good profile and ratio Disc goals for lipids and reasons to control them Rev labs with pt Rev low sat fat diet in detail

## 2017-12-30 ENCOUNTER — Encounter: Payer: Self-pay | Admitting: Family Medicine

## 2017-12-30 NOTE — Assessment & Plan Note (Signed)
Doing well with current cardiology care- beta blocker and flecainide  Pulse is up today-per pt this can be labile/ w/o symptoms

## 2017-12-30 NOTE — Assessment & Plan Note (Signed)
No changes Nocturia times one No fam hx Lab Results  Component Value Date   PSA 1.75 09/14/2017   PSA 2.10 09/10/2016   PSA 1.29 12/04/2014

## 2017-12-30 NOTE — Assessment & Plan Note (Signed)
No changes  Lab Results  Component Value Date   PSA 1.75 09/14/2017   PSA 2.10 09/10/2016   PSA 1.29 12/04/2014

## 2017-12-30 NOTE — Assessment & Plan Note (Signed)
L 3rd finger and 4th finger  His past hand surgeon retired-looking for a new specialist

## 2017-12-30 NOTE — Assessment & Plan Note (Signed)
Stable Pt thinks he is doing well given stressors/caregiving  Takes melatonin for sleep  Good self care but no time for exercise Reviewed stressors/ coping techniques/symptoms/ support sources/ tx options and side effects in detail today

## 2017-12-30 NOTE — Assessment & Plan Note (Signed)
LDL is up slightly this time- but still good profile and ratio Disc goals for lipids and reasons to control them Rev labs with pt Rev low sat fat diet in detail

## 2018-01-04 ENCOUNTER — Telehealth: Payer: Self-pay | Admitting: *Deleted

## 2018-01-04 NOTE — Telephone Encounter (Signed)
-----   Message from Abner Greenspan, MD sent at 01/03/2018  8:31 PM EDT ----- Regarding: FW: recommendation  Please let pt know -these are the recommended names of hand surgeons for Dupuytren's disease  Let us know if he picks one and wants a referral  ----- Message ----- From: Owens Loffler, MD Sent: 12/30/2017   1:25 PM To: Abner Greenspan, MD Subject: RE: recommendation                             That is a tough problem in general.   Generally, I like Bill Gramig, Iran Planas, and Milly Jakob - I would send him to one of them.  Take care! Frederico Hamman  ----- Message ----- From: Abner Greenspan, MD Sent: 12/29/2017   7:01 PM To: Owens Loffler, MD Subject: recommendation                                 What hand surgeon do you like best for Dupuytren's contractures?  One of my patients needs a new one  Thanks!

## 2018-01-04 NOTE — Telephone Encounter (Signed)
Pt was given the names of the 3 docs and he will look into them and call us back to let us know which one her prefers so we can put a referral in for that doc

## 2018-09-10 DIAGNOSIS — Z23 Encounter for immunization: Secondary | ICD-10-CM | POA: Diagnosis not present

## 2018-09-15 ENCOUNTER — Ambulatory Visit: Payer: Medicare Other

## 2018-09-27 ENCOUNTER — Telehealth: Payer: Self-pay | Admitting: Internal Medicine

## 2018-09-27 DIAGNOSIS — I493 Ventricular premature depolarization: Secondary | ICD-10-CM

## 2018-09-27 NOTE — Telephone Encounter (Signed)
Spoke with the patient, he for the past few months has noticed his tachycardia is getting worse and more frequent. In the morning the patient feels fine, but around mid day, he starts to feel it and it gets better in the evening with his lopressor. He feels fine currently on the phone. During the tachycardia, he states it limits his physical abilities due to being SOB. Sending to Dr. Lovena Le for recommendations. He did not have a HR value.

## 2018-09-27 NOTE — Telephone Encounter (Signed)
STAT if HR is under 50 or over 120 (normal HR is 60-100 beats per minute)  1) What is your heart rate? Unsure   2) Do you have a log of your heart rate readings (document readings)? NO  3) Do you have any other symptoms? Just feels that it's beating fast.   Call was disconnected.

## 2018-09-28 NOTE — Telephone Encounter (Signed)
Call placed to Pt.  Advised Dr. Lovena Le would like Pt to wear 24 hour monitor and follow up on October 12, 2018 at 12:30 pm.  Pt indicates understanding.

## 2018-09-29 ENCOUNTER — Encounter: Payer: Medicare Other | Admitting: Family Medicine

## 2018-10-03 ENCOUNTER — Ambulatory Visit (INDEPENDENT_AMBULATORY_CARE_PROVIDER_SITE_OTHER): Payer: Medicare Other

## 2018-10-03 DIAGNOSIS — I493 Ventricular premature depolarization: Secondary | ICD-10-CM

## 2018-10-11 ENCOUNTER — Encounter: Payer: Medicare Other | Admitting: Family Medicine

## 2018-10-12 ENCOUNTER — Encounter: Payer: Self-pay | Admitting: Internal Medicine

## 2018-10-12 ENCOUNTER — Ambulatory Visit (INDEPENDENT_AMBULATORY_CARE_PROVIDER_SITE_OTHER): Payer: Medicare Other | Admitting: Internal Medicine

## 2018-10-12 VITALS — BP 110/64 | HR 56 | Ht 71.5 in | Wt 159.0 lb

## 2018-10-12 DIAGNOSIS — I493 Ventricular premature depolarization: Secondary | ICD-10-CM | POA: Diagnosis not present

## 2018-10-12 DIAGNOSIS — R002 Palpitations: Secondary | ICD-10-CM | POA: Diagnosis not present

## 2018-10-12 DIAGNOSIS — R55 Syncope and collapse: Secondary | ICD-10-CM | POA: Diagnosis not present

## 2018-10-12 NOTE — Progress Notes (Signed)
HPI Jacob Marsh returns today for followup of his atrial and ventricular arrhythmias. He is a pleasant 67 yo man with PVC"s and PAC's as well as atrial fib and flutter. He does not have HTN. He has lost his father and his wife who has early onset dementia has moved to a SNF. He denies chest pain or sob. He has some break through palpitations.  No Known Allergies   Current Outpatient Medications  Medication Sig Dispense Refill  . flecainide (TAMBOCOR) 100 MG tablet Take 1 tablet (100 mg total) by mouth 2 (two) times daily. 180 tablet 3  . metoprolol tartrate (LOPRESSOR) 25 MG tablet TAKE 1/2 TABLET(12.5 MG) BY MOUTH AT BEDTIME 45 tablet 3  . Multiple Vitamin (MULTIVITAMIN WITH MINERALS) TABS Take 1 tablet by mouth daily.     . Omega-3 Fatty Acids (FISH OIL PO) Take 1 capsule by mouth daily.     . TURMERIC PO Take 400 mg daily by mouth.    Marland Kitchen UNABLE TO FIND 1 cup of real blueberries  by mouth daily  Plant based protein, 21 grams daily     No current facility-administered medications for this visit.      Past Medical History:  Diagnosis Date  . Depression   . Diverticulosis    History of diverticulosis// flex sig 2006//colonoscopy 01/21/2000 diverticulosis  . GERD (gastroesophageal reflux disease)   . Hx of echocardiogram    Echo (9/14): EF 72%, grade 1 diastolic dysfunction, trivial AI, trivial MR, MAC, PASP 29  . PVC (premature ventricular contraction)   . Trigger finger of both hands     ROS:   All systems reviewed and negative except as noted in the HPI.   Past Surgical History:  Procedure Laterality Date  . DUPUYTREN CONTRACTURE RELEASE    . TONSILLECTOMY    . WISDOM TOOTH EXTRACTION       Family History  Problem Relation Age of Onset  . Arthritis Mother   . Heart disease Mother        ? atrial fib  . Depression Sister   . Colon polyps Father   . Cancer Maternal Aunt        ?colon cancer  . Heart disease Maternal Grandmother        CAD  . Cancer  Maternal Grandmother        liver cancer  . Heart disease Maternal Grandfather        CAD  . Colon cancer Paternal Grandmother   . Sudden death Neg Hx      Social History   Socioeconomic History  . Marital status: Married    Spouse name: Not on file  . Number of children: Not on file  . Years of education: Not on file  . Highest education level: Not on file  Occupational History  . Not on file  Social Needs  . Financial resource strain: Not on file  . Food insecurity:    Worry: Not on file    Inability: Not on file  . Transportation needs:    Medical: Not on file    Non-medical: Not on file  Tobacco Use  . Smoking status: Never Smoker  . Smokeless tobacco: Never Used  Substance and Sexual Activity  . Alcohol use: Yes    Alcohol/week: 1.0 standard drinks    Types: 1 Glasses of wine per week    Comment: social 1 beer once monthly  . Drug use: No  . Sexual activity: Yes  Lifestyle  . Physical activity:    Days per week: Not on file    Minutes per session: Not on file  . Stress: Not on file  Relationships  . Social connections:    Talks on phone: Not on file    Gets together: Not on file    Attends religious service: Not on file    Active member of club or organization: Not on file    Attends meetings of clubs or organizations: Not on file    Relationship status: Not on file  . Intimate partner violence:    Fear of current or ex partner: Not on file    Emotionally abused: Not on file    Physically abused: Not on file    Forced sexual activity: Not on file  Other Topics Concern  . Not on file  Social History Narrative   Wife has alzheimer's and he cares for her at home   Still working as well      BP 110/64   Pulse (!) 56   Ht 5' 11.5" (1.816 m)   Wt 159 lb (72.1 kg)   BMI 21.87 kg/m   Physical Exam:  Well appearing NAD HEENT: Unremarkable Neck:  No JVD, no thyromegally Lymphatics:  No adenopathy Back:  No CVA tenderness Lungs:  Clear with no  wheezes HEART:  Regular rate rhythm, no murmurs, no rubs, no clicks Abd:  soft, positive bowel sounds, no organomegally, no rebound, no guarding Ext:  2 plus pulses, no edema, no cyanosis, no clubbing Skin:  No rashes no nodules Neuro:  CN II through XII intact, motor grossly intact  EKG - NSR with PVC's   Assess/Plan: 1. Palpitations - overall his symptoms are better. He will continue his current meds. I have asked him to take an additional 1/2 tablet of flecainide for breakthrough symptoms. 2. PVC's - as above. He had 6k in 24 hours. I reassured him. 3. Atrial fib/flutter - his is minimally symptomatic. His CHADSVASC score is 1.   Mikle [email or web address].D.

## 2018-10-12 NOTE — Patient Instructions (Signed)

## 2018-11-08 ENCOUNTER — Other Ambulatory Visit: Payer: Self-pay | Admitting: Internal Medicine

## 2018-11-08 MED ORDER — FLECAINIDE ACETATE 100 MG PO TABS: 100.0000 mg | ORAL_TABLET | Freq: Two times a day (BID) | ORAL | 3 refills | Status: DC

## 2018-11-08 NOTE — Telephone Encounter (Signed)
 *  STAT* If patient is at the pharmacy, call can be transferred to refill team.   1. Which medications need to be refilled? (please list name of each medication and dose if known) flecainide (TAMBOCOR) 100 MG tablet  2. Which pharmacy/location (including street and city if local pharmacy) is medication to be sent to? [facility] on [address]  3. Do they need a 30 day or 90 day supply? 29  Dr Lovena Le told him he could take 1/2 pill midday and pt says that his helping. He will need the new script written so that he will have enough to take 2 1/2 daily

## 2018-11-08 NOTE — Telephone Encounter (Signed)
Pt's medication was sent to pt's pharmacy as requested. Confirmation received.  °

## 2018-11-16 DIAGNOSIS — M79641 Pain in right hand: Secondary | ICD-10-CM | POA: Diagnosis not present

## 2018-11-16 DIAGNOSIS — M720 Palmar fascial fibromatosis [Dupuytren]: Secondary | ICD-10-CM | POA: Diagnosis not present

## 2018-11-16 DIAGNOSIS — M79642 Pain in left hand: Secondary | ICD-10-CM | POA: Diagnosis not present

## 2018-11-24 ENCOUNTER — Telehealth: Payer: Self-pay | Admitting: Family Medicine

## 2018-11-24 ENCOUNTER — Ambulatory Visit: Payer: Medicare Other | Admitting: Internal Medicine

## 2018-11-24 NOTE — Telephone Encounter (Signed)
Left message asking pt to call office please r/s 3/5 appointment with lisa prior to dr tower cpx appointment.  If you cannot get appointment r/s before dr tower appointment change to lab appointment and make on dr tower appointment @@give  medicare wellness paperwork

## 2018-11-30 ENCOUNTER — Other Ambulatory Visit: Payer: Self-pay | Admitting: Internal Medicine

## 2018-12-12 ENCOUNTER — Telehealth: Payer: Self-pay | Admitting: *Deleted

## 2018-12-12 NOTE — Telephone Encounter (Signed)
Patient returned call

## 2018-12-12 NOTE — Telephone Encounter (Signed)
   [facility] Pre-operative Risk Assessment    Request for surgical clearance:  1. What type of surgery is being performed? RIGHT HAND DUPUYTREN'S CONTRACTURE    2. When is this surgery scheduled? 01/20/19   3. What type of clearance is required (medical clearance vs. Pharmacy clearance to hold med vs. Both)? MEDICAL  4. Are there any medications that need to be held prior to surgery and how long?NONE LISTED   5. Practice name and name of physician performing surgery? [facility]; DR. Amedeo Plenty  6. What is your office phone number [phone]    7.   What is your office fax number [phone]  8.   Anesthesia type (None, local, MAC, general) ? GENERAL   Julaine Hua 12/12/2018, 10:00 AM  _________________________________________________________________   (provider comments below)

## 2018-12-12 NOTE — Telephone Encounter (Signed)
   Primary Cardiologist:Gregg Lovena Le, MD  Chart reviewed as part of pre-operative protocol coverage.  Left voice mail to call back to go over cardiac symptoms.   [place], [place]  12/12/2018, 10:12 AM

## 2018-12-12 NOTE — Telephone Encounter (Signed)
Since last office visit with Dr. Lovena Le patient has done well. He is taking additional 1/2 tablet of flecainide (total 50mg ) each afternoon on top of 100mg  BID. No recurrent of symptoms.   He is easily getting > 4 mets of activity.   Given past medical history and time since last visit, based on ACC/AHA guidelines, Jacob Marsh would be at acceptable risk for the planned procedure without further cardiovascular testing.   I will route this recommendation to the requesting party via Epic fax function and remove from pre-op pool.  Please call with questions.  [place], [place] 12/12/2018, 1:09 PM

## 2018-12-28 ENCOUNTER — Telehealth: Payer: Self-pay | Admitting: Family Medicine

## 2018-12-28 DIAGNOSIS — R350 Frequency of micturition: Principal | ICD-10-CM

## 2018-12-28 DIAGNOSIS — Z13220 Encounter for screening for lipoid disorders: Secondary | ICD-10-CM

## 2018-12-28 DIAGNOSIS — N401 Enlarged prostate with lower urinary tract symptoms: Secondary | ICD-10-CM

## 2018-12-28 DIAGNOSIS — R002 Palpitations: Secondary | ICD-10-CM

## 2018-12-28 DIAGNOSIS — Z125 Encounter for screening for malignant neoplasm of prostate: Secondary | ICD-10-CM

## 2018-12-28 NOTE — Telephone Encounter (Signed)
-----   Message from Ellamae Sia sent at 12/19/2018  3:20 PM EST ----- Regarding: Lab orders for Thursday, 3.5.20 Patient is scheduled for CPX labs, please order future labs, Thanks , Karna Christmas

## 2018-12-29 ENCOUNTER — Other Ambulatory Visit (INDEPENDENT_AMBULATORY_CARE_PROVIDER_SITE_OTHER): Payer: Medicare Other

## 2018-12-29 ENCOUNTER — Other Ambulatory Visit: Payer: Medicare Other

## 2018-12-29 ENCOUNTER — Ambulatory Visit: Payer: Medicare Other

## 2018-12-29 DIAGNOSIS — R002 Palpitations: Secondary | ICD-10-CM | POA: Diagnosis not present

## 2018-12-29 DIAGNOSIS — Z13220 Encounter for screening for lipoid disorders: Secondary | ICD-10-CM | POA: Diagnosis not present

## 2018-12-29 DIAGNOSIS — Z125 Encounter for screening for malignant neoplasm of prostate: Secondary | ICD-10-CM | POA: Diagnosis not present

## 2018-12-29 LAB — LIPID PANEL
Cholesterol: 169 mg/dL (ref 0–200)
HDL: 59.4 mg/dL (ref >39.00)
LDL Cholesterol: 95 mg/dL (ref 0–99)
NonHDL: 109.76
Total CHOL/HDL Ratio: 3
Triglycerides: 74 mg/dL (ref 0.0–149.0)
VLDL: 14.8 mg/dL (ref 0.0–40.0)

## 2018-12-29 LAB — CBC WITH DIFFERENTIAL/PLATELET
Basophils Absolute: 0 10*3/uL (ref 0.0–0.1)
Basophils Relative: 0.4 % (ref 0.0–3.0)
Eosinophils Absolute: 0.1 10*3/uL (ref 0.0–0.7)
Eosinophils Relative: 1.1 % (ref 0.0–5.0)
HCT: 47.7 % (ref 39.0–52.0)
Hemoglobin: 16 g/dL (ref 13.0–17.0)
Lymphocytes Relative: 24.2 % (ref 12.0–46.0)
Lymphs Abs: 1.3 10*3/uL (ref 0.7–4.0)
MCHC: 33.5 g/dL (ref 30.0–36.0)
MCV: 97.3 fl (ref 78.0–100.0)
Monocytes Absolute: 0.6 10*3/uL (ref 0.1–1.0)
Monocytes Relative: 11 % (ref 3.0–12.0)
Neutro Abs: 3.5 10*3/uL (ref 1.4–7.7)
Neutrophils Relative %: 63.3 % (ref 43.0–77.0)
Platelets: 147 10*3/uL — ABNORMAL LOW (ref 150.0–400.0)
RBC: 4.91 Mil/uL (ref 4.22–5.81)
RDW: 13.4 % (ref 11.5–15.5)
WBC: 5.6 10*3/uL (ref 4.0–10.5)

## 2018-12-29 LAB — COMPREHENSIVE METABOLIC PANEL
ALT: 26 U/L (ref 0–53)
AST: 25 U/L (ref 0–37)
Albumin: 4.5 g/dL (ref 3.5–5.2)
Alkaline Phosphatase: 50 U/L (ref 39–117)
BUN: 12 mg/dL (ref 6–23)
CO2: 31 mEq/L (ref 19–32)
Calcium: 9.5 mg/dL (ref 8.4–10.5)
Chloride: 102 mEq/L (ref 96–112)
Creatinine, Ser: 0.82 mg/dL (ref 0.40–1.50)
GFR: 93.07 mL/min (ref >60.00)
Glucose, Bld: 84 mg/dL (ref 70–99)
Potassium: 4.1 mEq/L (ref 3.5–5.1)
Sodium: 138 mEq/L (ref 135–145)
Total Bilirubin: 0.7 mg/dL (ref 0.2–1.2)
Total Protein: 6.7 g/dL (ref 6.0–8.3)

## 2018-12-29 LAB — PSA, MEDICARE: PSA: 2.38 ng/ml (ref 0.10–4.00)

## 2018-12-29 LAB — TSH: TSH: 2.7 u[IU]/mL (ref 0.35–4.50)

## 2019-01-03 ENCOUNTER — Ambulatory Visit (INDEPENDENT_AMBULATORY_CARE_PROVIDER_SITE_OTHER): Payer: Medicare Other | Admitting: Family Medicine

## 2019-01-03 ENCOUNTER — Encounter: Payer: Self-pay | Admitting: Family Medicine

## 2019-01-03 VITALS — BP 98/62 | HR 85 | Temp 97.7°F | Ht 71.5 in | Wt 158.4 lb

## 2019-01-03 DIAGNOSIS — R350 Frequency of micturition: Secondary | ICD-10-CM | POA: Diagnosis not present

## 2019-01-03 DIAGNOSIS — Z125 Encounter for screening for malignant neoplasm of prostate: Secondary | ICD-10-CM | POA: Diagnosis not present

## 2019-01-03 DIAGNOSIS — M720 Palmar fascial fibromatosis [Dupuytren]: Secondary | ICD-10-CM | POA: Diagnosis not present

## 2019-01-03 DIAGNOSIS — Z13220 Encounter for screening for lipoid disorders: Secondary | ICD-10-CM | POA: Diagnosis not present

## 2019-01-03 DIAGNOSIS — F329 Major depressive disorder, single episode, unspecified: Secondary | ICD-10-CM | POA: Diagnosis not present

## 2019-01-03 DIAGNOSIS — N401 Enlarged prostate with lower urinary tract symptoms: Secondary | ICD-10-CM | POA: Diagnosis not present

## 2019-01-03 DIAGNOSIS — Z0000 Encounter for general adult medical examination without abnormal findings: Secondary | ICD-10-CM | POA: Diagnosis not present

## 2019-01-03 DIAGNOSIS — F32A Depression, unspecified: Secondary | ICD-10-CM

## 2019-01-03 DIAGNOSIS — I493 Ventricular premature depolarization: Secondary | ICD-10-CM | POA: Diagnosis not present

## 2019-01-03 DIAGNOSIS — R55 Syncope and collapse: Secondary | ICD-10-CM

## 2019-01-03 NOTE — Assessment & Plan Note (Signed)
Both hands  Getting ready for surgery on L hand (he has had surgery in the past) Some contractures are becoming worse

## 2019-01-03 NOTE — Assessment & Plan Note (Signed)
Lab Results  Component Value Date   PSA 2.38 12/29/2018   PSA 1.75 09/14/2017   PSA 2.10 09/10/2016   Has h/o BPH-clinically unchanged

## 2019-01-03 NOTE — Progress Notes (Signed)
Subjective:    Patient ID: Jacob Marsh, male    DOB: [birth year], 68 y.o.   MRN: [id number]  HPI  Here for amw and rev of chronic medical problems  I have personally reviewed the Medicare Annual Wellness questionnaire and have noted 1. [name] medical and social history 2. Their use of alcohol, tobacco or illicit drugs 3. Their current medications and supplements 4. [name] functional ability including ADL's, fall risks, home safety risks and hearing or visual             impairment. 5. Diet and physical activities 6. Evidence for depression or mood disorders  The patients weight, height, BMI have been recorded in the chart and visual acuity is per eye clinic.  I have made referrals, counseling and provided education to the patient based review of the above and I have provided the pt with a written personalized care plan for preventive services. Reviewed and updated provider list, see scanned forms.  See scanned forms.  Routine anticipatory guidance given to patient.  See health maintenance. Colon cancer screening 11/16 colonoscopy- per pt 5 y  No first degree relative with colon cancer/ but father had polyps and PGM with colon cancer   Flu vaccine 11/19 Tetanus vaccine 5/10 Pneumovax completed  Zoster vaccine- will look into shingrix  Prostate cancer screening  Lab Results  Component Value Date   PSA 2.38 12/29/2018   PSA 1.75 09/14/2017   PSA 2.10 09/10/2016  no big changes  No prostate cancer   Lost father at 53 Ok with that  Advance directive- has one, POA is his oldest son (34 the 27rd and daughter Jacob Marsh)  Cognitive function addressed- see scanned forms- and if abnormal then additional documentation follows.  Misplaces things/nothing more than that   Still working and stays organized  Goal is to slow down  Twice as busy as he should be  Cannot control everything (works in [place])   [place] reviewed  Meds, vitals, and  allergies reviewed.   ROS: See HPI.  Otherwise negative.    Wt Readings from Last 3 Encounters:  01/03/19 158 lb 7 oz (71.9 kg)  10/12/18 159 lb (72.1 kg)  12/29/17 158 lb 8 oz (71.9 kg)   21.79 kg/m   Wife is now in a memory care facility -he reached the limit of what he could do  It has made a big difference  Happy with her care   Taking care of himself now much more  Focus is on his sleep -getting another hour than he was  6 1/2 now  Would like 7 1/2 (or 7)  He sleeps 4 hours and then gets up to urinate  Hard to go back to sleep (busy brain) -he read a new book /really helped him    Hearing Screening   _0  _1  _2  _3  _4  _5  _6  _7  _8   Right ear:   40 40 40  40    Left ear:   40 40 40  40    Vision Screening Comments: Pt has yearly eye exams with Dr. Valetta Close at [place]  BP Readings from Last 3 Encounters:  01/03/19 98/62  10/12/18 110/64  12/29/17 98/62   Pulse Readings from Last 3 Encounters:  01/03/19 85  10/12/18 (!) 56  12/29/17 99   On bet blocker and flecinide for rhythm Last visit with Dr Lovena Le- January - inc his flecainide by another 1/2 pill) No dizziness  H/o BPH Thinks he has done ok  Nocturia -once  More frequency if coffee/ cut that back  Lab Results  Component Value Date   PSA 2.38 12/29/2018   PSA 1.75 09/14/2017   PSA 2.10 09/10/2016     lipid screen Lab Results  Component Value Date   CHOL 169 12/29/2018   CHOL 183 09/14/2017   CHOL 162 12/04/2014   Lab Results  Component Value Date   HDL 59.40 12/29/2018   HDL 54.80 09/14/2017   HDL 47.90 12/04/2014   Lab Results  Component Value Date   LDLCALC 95 12/29/2018   LDLCALC 118 (H) 09/14/2017   LDLCALC 97 12/04/2014   Lab Results  Component Value Date   TRIG 74.0 12/29/2018   TRIG 51.0 09/14/2017   TRIG 84.0 12/04/2014   Lab Results  Component Value Date   CHOLHDL 3 12/29/2018   CHOLHDL 3 09/14/2017   CHOLHDL 3 12/04/2014    No results found for: LDLDIRECT Very good  Eats quite healthy -better than last time  Eating more plant based  Avoids red meat  More beans and nuts  Keeping up protein   Exercise - walks [place]  Lt weights and stretching    Other labs Results for orders placed or performed in visit on 12/29/18  PSA, Medicare  Result Value Ref Range   PSA 2.38 0.10 - 4.00 ng/ml  TSH  Result Value Ref Range   TSH 2.70 0.35 - 4.50 uIU/mL  Lipid panel  Result Value Ref Range   Cholesterol 169 0 - 200 mg/dL   Triglycerides 74.0 0.0 - 149.0 mg/dL   HDL 59.40 >39.00 mg/dL   VLDL 14.8 0.0 - 40.0 mg/dL   LDL Cholesterol 95 0 - 99 mg/dL   Total CHOL/HDL Ratio 3    NonHDL 109.76   Comprehensive metabolic panel  Result Value Ref Range   Sodium 138 135 - 145 mEq/L   Potassium 4.1 3.5 - 5.1 mEq/L   Chloride 102 96 - 112 mEq/L   CO2 31 19 - 32 mEq/L   Glucose, Bld 84 70 - 99 mg/dL   BUN 12 6 - 23 mg/dL   Creatinine, Ser 0.82 0.40 - 1.50 mg/dL   Total Bilirubin 0.7 0.2 - 1.2 mg/dL   Alkaline Phosphatase 50 39 - 117 U/L   AST 25 0 - 37 U/L   ALT 26 0 - 53 U/L   Total Protein 6.7 6.0 - 8.3 g/dL   Albumin 4.5 3.5 - 5.2 g/dL   Calcium 9.5 8.4 - 10.5 mg/dL   GFR 93.07 >60.00 mL/min  CBC with Differential/Platelet  Result Value Ref Range   WBC 5.6 4.0 - 10.5 K/uL   RBC 4.91 4.22 - 5.81 Mil/uL   Hemoglobin 16.0 13.0 - 17.0 g/dL   HCT 47.7 39.0 - 52.0 %   MCV 97.3 78.0 - 100.0 fl   MCHC 33.5 30.0 - 36.0 g/dL   RDW 13.4 11.5 - 15.5 %   Platelets 147.0 (L) 150.0 - 400.0 K/uL   Neutrophils Relative % 63.3 43.0 - 77.0 %   Lymphocytes Relative 24.2 12.0 - 46.0 %   Monocytes Relative 11.0 3.0 - 12.0 %   Eosinophils Relative 1.1 0.0 - 5.0 %   Basophils Relative 0.4 0.0 - 3.0 %   Neutro Abs 3.5 1.4 - 7.7 K/uL   Lymphs Abs 1.3 0.7 - 4.0 K/uL   Monocytes Absolute 0.6 0.1 - 1.0 K/uL   Eosinophils Absolute 0.1 0.0 - 0.7 K/uL  Basophils Absolute 0.0 0.0 - 0.1 K/uL    Patient Active Problem List    Diagnosis Date Noted  . Dupuytren contracture 12/29/2017  . Trigger finger of both hands   . Hx of echocardiogram   . Diverticulosis   . Depression   . BPH (benign prostatic hyperplasia) 09/15/2016  . Need for hepatitis C screening test 09/07/2016  . Encounter for Medicare annual wellness exam 12/11/2014  . Screening for lipoid disorders 12/03/2014  . Hoarse 07/17/2014  . Globus sensation 07/17/2014  . Syncope 04/24/2014  . PVCs (premature ventricular contractions) 03/20/2014  . Postural dizziness 03/20/2014  . Palpitations 08/10/2013  . History of chest pain 08/17/2012  . Routine general medical examination at a health care facility 08/09/2012  . Prostate cancer screening 08/09/2012   Past Medical History:  Diagnosis Date  . Depression   . Diverticulosis    History of diverticulosis// flex sig 2006//colonoscopy 01/21/2000 diverticulosis  . GERD (gastroesophageal reflux disease)   . Hx of echocardiogram    Echo (9/14): EF 56%, grade 1 diastolic dysfunction, trivial AI, trivial MR, MAC, PASP 29  . PVC (premature ventricular contraction)   . Trigger finger of both hands    Past Surgical History:  Procedure Laterality Date  . DUPUYTREN CONTRACTURE RELEASE    . TONSILLECTOMY    . WISDOM TOOTH EXTRACTION     Social History   Tobacco Use  . Smoking status: Never Smoker  . Smokeless tobacco: Never Used  Substance Use Topics  . Alcohol use: Yes    Alcohol/week: 1.0 standard drinks    Types: 1 Glasses of wine per week    Comment: social 1 beer once monthly  . Drug use: No   Family History  Problem Relation Age of Onset  . Arthritis Mother   . Heart disease Mother        ? atrial fib  . Depression Sister   . Colon polyps Father   . Cancer Maternal Aunt        ?colon cancer  . Heart disease Maternal Grandmother        CAD  . Cancer Maternal Grandmother        liver cancer  . Heart disease Maternal Grandfather        CAD  . Colon cancer Paternal Grandmother   .  Sudden death Neg Hx    No Known Allergies Current Outpatient Medications on File Prior to Visit  Medication Sig Dispense Refill  . flecainide (TAMBOCOR) 100 MG tablet Take 1 tablet (100 mg total) by mouth 2 (two) times daily. (Patient taking differently: Take 100 mg by mouth 2 (two) times daily. Also 1/2 tablet mid day) 180 tablet 3  . metoprolol tartrate (LOPRESSOR) 25 MG tablet TAKE 1/2 TABLET(12.5 MG) BY MOUTH AT BEDTIME 45 tablet 3  . Multiple Vitamin (MULTIVITAMIN WITH MINERALS) TABS Take 1 tablet by mouth daily.     . Omega-3 Fatty Acids (FISH OIL PO) Take 1 capsule by mouth daily.     . TURMERIC PO Take 400 mg daily by mouth.    Marland Kitchen UNABLE TO FIND 1 cup of real blueberries  by mouth daily  Plant based protein, 21 grams daily     No current facility-administered medications on file prior to visit.     Review of Systems  Constitutional: Positive for fatigue. Negative for activity change, appetite change, fever and unexpected weight change.  HENT: Negative for congestion, rhinorrhea, sore throat and trouble swallowing.   Eyes: Negative for pain,  redness, itching and visual disturbance.  Respiratory: Negative for cough, chest tightness, shortness of breath and wheezing.   Cardiovascular: Negative for chest pain and palpitations.  Gastrointestinal: Negative for abdominal pain, blood in stool, constipation, diarrhea and nausea.  Endocrine: Negative for cold intolerance, heat intolerance, polydipsia and polyuria.  Genitourinary: Negative for difficulty urinating, dysuria, frequency and urgency.  Musculoskeletal: Negative for arthralgias, joint swelling and myalgias.       Dupuytren's in both hands with contractures   Skin: Negative for pallor and rash.  Neurological: Negative for dizziness, tremors, weakness, numbness and headaches.  Hematological: Negative for adenopathy. Does not bruise/bleed easily.  Psychiatric/Behavioral: Positive for dysphoric mood and sleep disturbance. Negative for  decreased concentration and suicidal ideas. The patient is not nervous/anxious.        Objective:   Physical Exam Constitutional:      General: He is not in acute distress.    Appearance: Normal appearance. He is well-developed and normal weight. He is not ill-appearing.  HENT:     Head: Normocephalic and atraumatic.     Right Ear: Tympanic membrane, ear canal and external ear normal.     Left Ear: Tympanic membrane, ear canal and external ear normal.     Nose: Nose normal.     Mouth/Throat:     Mouth: Mucous membranes are moist.     Pharynx: Oropharynx is clear.  Eyes:     General: No scleral icterus.       Right eye: No discharge.        Left eye: No discharge.     Conjunctiva/sclera: Conjunctivae normal.     Pupils: Pupils are equal, round, and reactive to light.  Neck:     Musculoskeletal: Normal range of motion and neck supple.     Thyroid: No thyromegaly.     Vascular: No carotid bruit or JVD.  Cardiovascular:     Rate and Rhythm: Normal rate and regular rhythm.     Pulses: Normal pulses.     Heart sounds: Normal heart sounds. No gallop.   Pulmonary:     Effort: Pulmonary effort is normal. No respiratory distress.     Breath sounds: Normal breath sounds. No wheezing.  Chest:     Chest wall: No tenderness.  Abdominal:     General: Bowel sounds are normal. There is no distension or abdominal bruit.     Palpations: Abdomen is soft. There is no mass.     Tenderness: There is no abdominal tenderness.  Musculoskeletal:        General: No tenderness.     Right lower leg: No edema.     Left lower leg: No edema.     Comments: Baseline dupuytren's contractures/deformities in both hands -worsened   Lymphadenopathy:     Cervical: No cervical adenopathy.  Skin:    General: Skin is warm and dry.     Coloration: Skin is not pale.     Findings: No erythema or rash.     Comments: Fair Solar lentigines diffusely   Neurological:     General: No focal deficit present.      Mental Status: He is alert.     Cranial Nerves: No cranial nerve deficit.     Motor: No weakness or abnormal muscle tone.     Coordination: Coordination normal.     Gait: Gait normal.     Deep Tendon Reflexes: Reflexes are normal and symmetric. Reflexes normal.  Psychiatric:        Mood  and Affect: Mood normal.           Assessment & Plan:   Problem List Items Addressed This Visit      Cardiovascular and Mediastinum   PVCs (premature ventricular contractions)    Under care of cardiology- doing better with beta blocker and flecainide  Watching bp  BP: 98/62   No symptoms of hypotension      Syncope    No recent episodes        Genitourinary   BPH (benign prostatic hyperplasia)    No clinical changes Lab Results  Component Value Date   PSA 2.38 12/29/2018   PSA 1.75 09/14/2017   PSA 2.10 09/10/2016   If nocturia increases would consider tx  Continue to watch        Other   Prostate cancer screening    Lab Results  Component Value Date   PSA 2.38 12/29/2018   PSA 1.75 09/14/2017   PSA 2.10 09/10/2016   Has h/o BPH-clinically unchanged       Screening for lipoid disorders   Encounter for Medicare annual wellness exam - Primary    Reviewed health habits including diet and exercise and skin cancer prevention Reviewed appropriate screening tests for age  Also reviewed health mt list, fam hx and immunization status , as well as social and family history   See HPI Labs reviewed Disc BPH and prostate screening  Disc sleep habits and mood Has adv directive No cognitive concerns Nl hearing /vision  Enc him to continue excellent diet and add exercise when able        Depression    Doing fairly well  Working on sleep hygiene/habits Disc meditation  Wife is now in memory care-this has left him much less overwhelmed and worried      Dupuytren contracture    Both hands  Getting ready for surgery on L hand (he has had surgery in the past) Some  contractures are becoming worse

## 2019-01-03 NOTE — Assessment & Plan Note (Signed)
No recent episodes

## 2019-01-03 NOTE — Assessment & Plan Note (Signed)
Under care of cardiology- doing better with beta blocker and flecainide  Watching bp  BP: 98/62   No symptoms of hypotension

## 2019-01-03 NOTE — Assessment & Plan Note (Addendum)
Doing fairly well  Working on sleep hygiene/habits Disc meditation  Wife is now in memory care-this has left him much less overwhelmed and worried

## 2019-01-03 NOTE — Assessment & Plan Note (Signed)
No clinical changes Lab Results  Component Value Date   PSA 2.38 12/29/2018   PSA 1.75 09/14/2017   PSA 2.10 09/10/2016   If nocturia increases would consider tx  Continue to watch

## 2019-01-03 NOTE — Assessment & Plan Note (Signed)
Reviewed health habits including diet and exercise and skin cancer prevention Reviewed appropriate screening tests for age  Also reviewed health mt list, fam hx and immunization status , as well as social and family history   See HPI Labs reviewed Disc BPH and prostate screening  Disc sleep habits and mood Has adv directive No cognitive concerns Nl hearing Dannielle Karvonen  Enc him to continue excellent diet and add exercise when able

## 2019-01-03 NOTE — Patient Instructions (Addendum)
If you are interested in the new shingles vaccine (Shingrix) - call your local pharmacy to check on coverage and availability  If affordable, get on a wait list at your pharmacy to get the vaccine.  If prostate symptoms worsen please let me know  Check out the meditation app called Calm (sleep programs are great)  Keep practicing good sleep hygiene   Get breaks from work when you can   Continue eating healthy and exercising

## 2019-01-05 ENCOUNTER — Telehealth: Payer: Self-pay | Admitting: *Deleted

## 2019-01-05 NOTE — Telephone Encounter (Signed)
If he has no fever and cough-no need for concern at the moment.  (even if he has a cold he should be careful about contact with the immunocompromised) -as always

## 2019-01-05 NOTE — Telephone Encounter (Signed)
Patient called stating that he was in recently to see you. Patient stated that when he was in he was feeling fine. Patient stated that he has concerns about some symptoms that he has had off and on for a couple of weeks. Patient stated that he has a sore throat and post nasal drip that comes and goes for a couple of weeks. Patient stated that the problem that he is having may be allergies, so he went to the pharmacy and picked up some Claritin. Patient stated that he has not had a fever. Patient stated that his wife is in a memory care facility and his mother is elderly and wants to know what Dr. Glori Bickers recommends. Patient stated that he has not come in contact with anyone that has the coronavirus and has not travel.

## 2019-01-06 NOTE — Telephone Encounter (Signed)
Pt notified of Dr. Tower's comments and verbalized understanding  

## 2019-01-18 DIAGNOSIS — H0014 Chalazion left upper eyelid: Secondary | ICD-10-CM | POA: Diagnosis not present

## 2019-06-23 ENCOUNTER — Other Ambulatory Visit: Payer: Self-pay | Admitting: Internal Medicine

## 2019-06-23 NOTE — Telephone Encounter (Signed)
° ° ° °*  STAT* If patient is at the pharmacy, call can be transferred to refill team.   1. Which medications need to be refilled? (please list name of each medication and dose if known) flecainide (TAMBOCOR) 100 MG tablet  2. Which pharmacy/location (including street and city if local pharmacy) is medication to be sent to? [facility] [place], [address]  3. Do they need a 30 day or 90 day supply? [place]

## 2019-06-23 NOTE — Telephone Encounter (Signed)
**Note De-Identified Jacob Marsh Obfuscation** I called the pt as he is requesting a refill on his Flecainide 100 mg that he states Dr Lovena Le changed at his last OV on 10/12/18  to 100 mg BID with instructions to take another 1/2 tablet daily PRN for palpitations from BID. He states that he has been taking an extra 1/2 tablet most everyday since and is requesting his refills too soon because of this increase.  I have advised the pt that I will send this message to Dr Lovena Le and his nurse for clarification and he is aware that his ins provider will most likely not cover this as it will be considered exceeding quantity limits.  He states he has enough Flecainide to last until Monday.

## 2019-06-26 MED ORDER — FLECAINIDE ACETATE 100 MG PO TABS: 100.0000 mg | ORAL_TABLET | Freq: Two times a day (BID) | ORAL | 3 refills | Status: DC

## 2019-06-26 NOTE — Telephone Encounter (Signed)
**Note De-Identified Keslee Harrington Obfuscation** I called [facility] and s/w Nochia who advised me that the pts ins is covering the pts increased Flecainide RX of 100 mg BID/1/2 tablet prn for palpitations and that the pts co-pay is $15.   I called the pt but got no answer so I left a detailed message explaining the above and I left this offices phone number so he can call me back if he has any questions.

## 2019-06-26 NOTE — Telephone Encounter (Signed)
Discussed with Dr. Lovena Le.  Pt may have medication refilled.  Addended new prescription to state Pt may take an additional 1/2 tablet daily as needed for breakthrough palpitations.  Per Dr. Lovena Le- PT MAY HAVE TO INCREASE FLECAINIDE TO 150 MG BID.  Will continue to monitor.

## 2019-08-02 ENCOUNTER — Telehealth: Payer: Self-pay

## 2019-08-02 NOTE — Telephone Encounter (Signed)
Pt notified of Dr. Marliss Coots instructions. Pt will get shingles vaccine at a pharmacy about a week or 2 after getting his flu vaccine here

## 2019-08-02 NOTE — Telephone Encounter (Signed)
Patient called to ask a few questions about vaccines:  1) wanted a flu shot and I have him scheduled for this on 08/08/2019 and he will get High dose vaccine 2) asked about Shingrix vaccine. Advised patient to check with insurance to see if he can come here for this or go to the pharmacy.  3) Patient was wondering if he needs to get another Pneumonia vaccine. Advised patient on the 2 he already had per chart and that I will check with Dr Glori Bickers to see if he should repeat this. [id number].

## 2019-08-02 NOTE — Telephone Encounter (Signed)
Pneumonia vaccines are up to date   Most likely he will have to get shingrix at the pharmacy   Do not get shingrix same day as high dose flu

## 2019-08-08 ENCOUNTER — Ambulatory Visit (INDEPENDENT_AMBULATORY_CARE_PROVIDER_SITE_OTHER): Payer: Medicare Other

## 2019-08-08 DIAGNOSIS — Z23 Encounter for immunization: Secondary | ICD-10-CM | POA: Diagnosis not present

## 2019-08-31 ENCOUNTER — Telehealth: Payer: Self-pay | Admitting: Internal Medicine

## 2019-08-31 DIAGNOSIS — L570 Actinic keratosis: Secondary | ICD-10-CM | POA: Diagnosis not present

## 2019-08-31 DIAGNOSIS — L821 Other seborrheic keratosis: Secondary | ICD-10-CM | POA: Diagnosis not present

## 2019-08-31 DIAGNOSIS — D225 Melanocytic nevi of trunk: Secondary | ICD-10-CM | POA: Diagnosis not present

## 2019-08-31 DIAGNOSIS — L853 Xerosis cutis: Secondary | ICD-10-CM | POA: Diagnosis not present

## 2019-08-31 DIAGNOSIS — D1801 Hemangioma of skin and subcutaneous tissue: Secondary | ICD-10-CM | POA: Diagnosis not present

## 2019-08-31 MED ORDER — METOPROLOL TARTRATE 25 MG PO TABS: ORAL_TABLET | ORAL | 3 refills | Status: DC

## 2019-08-31 NOTE — Telephone Encounter (Signed)
Per Dr. Lovena Le --Pt may take an addl 1/2 tablet as needed for palpitations.  Corrected medication list as Pt is running out sooner.

## 2019-08-31 NOTE — Telephone Encounter (Signed)
New Message        *STAT* If patient is at the pharmacy, call can be transferred to refill team.   1. Which medications need to be refilled? (please list name of each medication and dose if known) metoprolol   2. Which pharmacy/location (including street and city if local pharmacy) is medication to be sent to? [facility] on [address]   3. Do they need a 30 day or 90 day supply? Pt would like for a nurse to call him, he says he needs a refill    Please call

## 2019-10-17 ENCOUNTER — Ambulatory Visit (INDEPENDENT_AMBULATORY_CARE_PROVIDER_SITE_OTHER): Payer: Medicare Other | Admitting: Internal Medicine

## 2019-10-17 ENCOUNTER — Other Ambulatory Visit: Payer: Self-pay

## 2019-10-17 VITALS — BP 92/64 | HR 90 | Ht 72.0 in | Wt 161.2 lb

## 2019-10-17 DIAGNOSIS — I4892 Unspecified atrial flutter: Secondary | ICD-10-CM

## 2019-10-17 DIAGNOSIS — I4891 Unspecified atrial fibrillation: Secondary | ICD-10-CM

## 2019-10-17 DIAGNOSIS — I493 Ventricular premature depolarization: Secondary | ICD-10-CM | POA: Diagnosis not present

## 2019-10-17 HISTORY — DX: Unspecified atrial fibrillation: I48.91

## 2019-10-17 LAB — CBC WITH DIFFERENTIAL/PLATELET
Basophils Absolute: 0 10*3/uL (ref 0.0–0.2)
Basos: 1 %
EOS (ABSOLUTE): 0.1 10*3/uL (ref 0.0–0.4)
Eos: 1 %
Hematocrit: 48.9 % (ref 37.5–51.0)
Hemoglobin: 16.3 g/dL (ref 13.0–17.7)
Immature Grans (Abs): 0 10*3/uL (ref 0.0–0.1)
Immature Granulocytes: 0 %
Lymphocytes Absolute: 1.5 10*3/uL (ref 0.7–3.1)
Lymphs: 25 %
MCH: 32.2 pg (ref 26.6–33.0)
MCHC: 33.3 g/dL (ref 31.5–35.7)
MCV: 97 fL (ref 79–97)
Monocytes Absolute: 0.7 10*3/uL (ref 0.1–0.9)
Monocytes: 12 %
Neutrophils Absolute: 3.7 10*3/uL (ref 1.4–7.0)
Neutrophils: 61 %
Platelets: 164 10*3/uL (ref 150–450)
RBC: 5.06 x10E6/uL (ref 4.14–5.80)
RDW: 12 % (ref 11.6–15.4)
WBC: 6 10*3/uL (ref 3.4–10.8)

## 2019-10-17 LAB — BASIC METABOLIC PANEL
BUN/Creatinine Ratio: 15 (ref 10–24)
BUN: 13 mg/dL (ref 8–27)
CO2: 23 mmol/L (ref 20–29)
Calcium: 9.6 mg/dL (ref 8.6–10.2)
Chloride: 103 mmol/L (ref 96–106)
Creatinine, Ser: 0.86 mg/dL (ref 0.76–1.27)
GFR calc Af Amer: 102 mL/min/{1.73_m2} (ref >59)
GFR calc non Af Amer: 88 mL/min/{1.73_m2} (ref >59)
Glucose: 67 mg/dL (ref 65–99)
Potassium: 4.6 mmol/L (ref 3.5–5.2)
Sodium: 138 mmol/L (ref 134–144)

## 2019-10-17 MED ORDER — RIVAROXABAN 20 MG PO TABS: 20.0000 mg | ORAL_TABLET | Freq: Every day | ORAL | 3 refills | Status: DC

## 2019-10-17 NOTE — Progress Notes (Signed)
HPI Mr. Fendley returns today for followup. He has a h/o atrial arrhythmias as well as PVC's and HTN. When I saw him last his father had died and his wife who has dementia was in a facility. He has been maintained on flecainide and a beta blocker. He feels sob with exertion. He gets fatigued when he walks. He has not had edema or syncope.He notes that he has felt fatigued.  No Known Allergies   Current Outpatient Medications  Medication Sig Dispense Refill  . flecainide (TAMBOCOR) 100 MG tablet Take 1 tablet (100 mg total) by mouth 2 (two) times daily. May take an additional 1/2 tablet daily for breakthrough palpitations. 270 tablet 3  . metoprolol tartrate (LOPRESSOR) 25 MG tablet Take 1/2 tablet by mouth daily at bedtime.  May take an additional 1/2 tablet as needed for palpitations. 90 tablet 3  . Multiple Vitamin (MULTIVITAMIN WITH MINERALS) TABS Take 1 tablet by mouth daily.     . Omega-3 Fatty Acids (FISH OIL PO) Take 1 capsule by mouth daily.     . TURMERIC PO Take 400 mg daily by mouth.    Marland Kitchen UNABLE TO FIND 1 cup of real blueberries  by mouth daily  Plant based protein, 21 grams daily     No current facility-administered medications for this visit.     Past Medical History:  Diagnosis Date  . Depression   . Diverticulosis    History of diverticulosis// flex sig 2006//colonoscopy 01/21/2000 diverticulosis  . GERD (gastroesophageal reflux disease)   . Hx of echocardiogram    Echo (9/14): EF 00%, grade 1 diastolic dysfunction, trivial AI, trivial MR, MAC, PASP 29  . PVC (premature ventricular contraction)   . Trigger finger of both hands     ROS:   All systems reviewed and negative except as noted in the HPI.   Past Surgical History:  Procedure Laterality Date  . DUPUYTREN CONTRACTURE RELEASE    . TONSILLECTOMY    . WISDOM TOOTH EXTRACTION       Family History  Problem Relation Age of Onset  . Arthritis Mother   . Heart disease Mother        ? atrial fib   . Depression Sister   . Colon polyps Father   . Cancer Maternal Aunt        ?colon cancer  . Heart disease Maternal Grandmother        CAD  . Cancer Maternal Grandmother        liver cancer  . Heart disease Maternal Grandfather        CAD  . Colon cancer Paternal Grandmother   . Sudden death Neg Hx      Social History   Socioeconomic History  . Marital status: Married    Spouse name: Not on file  . Number of children: Not on file  . Years of education: Not on file  . Highest education level: Not on file  Occupational History  . Not on file  Tobacco Use  . Smoking status: Never Smoker  . Smokeless tobacco: Never Used  Substance and Sexual Activity  . Alcohol use: Yes    Alcohol/week: 1.0 standard drinks    Types: 1 Glasses of wine per week    Comment: social 1 beer once monthly  . Drug use: No  . Sexual activity: Yes  Other Topics Concern  . Not on file  Social History Narrative   Wife has alzheimer's and he cares for  her at home   Still working as well    Social Determinants of Radio broadcast assistant Strain:   . Difficulty of Paying Living Expenses: Not on file  Food Insecurity:   . Worried About Charity fundraiser in the Last Year: Not on file  . Ran Out of Food in the Last Year: Not on file  Transportation Needs:   . Lack of Transportation (Medical): Not on file  . Lack of Transportation (Non-Medical): Not on file  Physical Activity:   . Days of Exercise per Week: Not on file  . Minutes of Exercise per Session: Not on file  Stress:   . Feeling of Stress : Not on file  Social Connections:   . Frequency of Communication with Friends and Family: Not on file  . Frequency of Social Gatherings with Friends and Family: Not on file  . Attends Religious Services: Not on file  . Active Member of Clubs or Organizations: Not on file  . Attends Archivist Meetings: Not on file  . Marital Status: Not on file  Intimate Partner Violence:   . Fear of  Current or Ex-Partner: Not on file  . Emotionally Abused: Not on file  . Physically Abused: Not on file  . Sexually Abused: Not on file     BP 92/64   Pulse 90   Ht 6' (1.829 m)   Wt 161 lb 3.2 oz (73.1 kg)   SpO2 (!) 87%   BMI 21.86 kg/m   Physical Exam:  Well appearing NAD HEENT: Unremarkable Neck:  No JVD, no thyromegally Lymphatics:  No adenopathy Back:  No CVA tenderness Lungs:  Clear with no wheezes HEART:  Regular rate rhythm, no murmurs, no rubs, no clicks Abd:  soft, positive bowel sounds, no organomegally, no rebound, no guarding Ext:  2 plus pulses, no edema, no cyanosis, no clubbing Skin:  No rashes no nodules Neuro:  CN II through XII intact, motor grossly intact  EKG - atrial flutter with a controlled VR  Assess/Plan: 1. Atrial flutter - he has had this on occaision but is now incessant. He is symptomatic. He will be started on systemic anti-coagulation with Eliquis and is instructed not to miss any. He is on flecainide.  2. PVC's - he appears to be well controlled on flecainide. I spent over 25 minutes discussed the ablation procedure with the patient.   Mikle Bosworth.D.

## 2019-10-17 NOTE — H&P (View-Only) (Signed)
    HPI Jacob Marsh returns today for followup. He has a h/o atrial arrhythmias as well as PVC's and HTN. When I saw him last his father had died and his wife who has dementia was in a facility. He has been maintained on flecainide and a beta blocker. He feels sob with exertion. He gets fatigued when he walks. He has not had edema or syncope.He notes that he has felt fatigued.  No Known Allergies   Current Outpatient Medications  Medication Sig Dispense Refill  . flecainide (TAMBOCOR) 100 MG tablet Take 1 tablet (100 mg total) by mouth 2 (two) times daily. May take an additional 1/2 tablet daily for breakthrough palpitations. 270 tablet 3  . metoprolol tartrate (LOPRESSOR) 25 MG tablet Take 1/2 tablet by mouth daily at bedtime.  May take an additional 1/2 tablet as needed for palpitations. 90 tablet 3  . Multiple Vitamin (MULTIVITAMIN WITH MINERALS) TABS Take 1 tablet by mouth daily.     . Omega-3 Fatty Acids (FISH OIL PO) Take 1 capsule by mouth daily.     . TURMERIC PO Take 400 mg daily by mouth.    . UNABLE TO FIND 1 cup of real blueberries  by mouth daily  Plant based protein, 21 grams daily     No current facility-administered medications for this visit.     Past Medical History:  Diagnosis Date  . Depression   . Diverticulosis    History of diverticulosis// flex sig 2006//colonoscopy 01/21/2000 diverticulosis  . GERD (gastroesophageal reflux disease)   . Hx of echocardiogram    Echo (9/14): EF 55%, grade 1 diastolic dysfunction, trivial AI, trivial MR, MAC, PASP 29  . PVC (premature ventricular contraction)   . Trigger finger of both hands     ROS:   All systems reviewed and negative except as noted in the HPI.   Past Surgical History:  Procedure Laterality Date  . DUPUYTREN CONTRACTURE RELEASE    . TONSILLECTOMY    . WISDOM TOOTH EXTRACTION       Family History  Problem Relation Age of Onset  . Arthritis Mother   . Heart disease Mother        ? atrial fib   . Depression Sister   . Colon polyps Father   . Cancer Maternal Aunt        ?colon cancer  . Heart disease Maternal Grandmother        CAD  . Cancer Maternal Grandmother        liver cancer  . Heart disease Maternal Grandfather        CAD  . Colon cancer Paternal Grandmother   . Sudden death Neg Hx      Social History   Socioeconomic History  . Marital status: Married    Spouse name: Not on file  . Number of children: Not on file  . Years of education: Not on file  . Highest education level: Not on file  Occupational History  . Not on file  Tobacco Use  . Smoking status: Never Smoker  . Smokeless tobacco: Never Used  Substance and Sexual Activity  . Alcohol use: Yes    Alcohol/week: 1.0 standard drinks    Types: 1 Glasses of wine per week    Comment: social 1 beer once monthly  . Drug use: No  . Sexual activity: Yes  Other Topics Concern  . Not on file  Social History Narrative   Wife has alzheimer's and he cares for   her at home   Still working as well    Social Determinants of Health   Financial Resource Strain:   . Difficulty of Paying Living Expenses: Not on file  Food Insecurity:   . Worried About Running Out of Food in the Last Year: Not on file  . Ran Out of Food in the Last Year: Not on file  Transportation Needs:   . Lack of Transportation (Medical): Not on file  . Lack of Transportation (Non-Medical): Not on file  Physical Activity:   . Days of Exercise per Week: Not on file  . Minutes of Exercise per Session: Not on file  Stress:   . Feeling of Stress : Not on file  Social Connections:   . Frequency of Communication with Friends and Family: Not on file  . Frequency of Social Gatherings with Friends and Family: Not on file  . Attends Religious Services: Not on file  . Active Member of Clubs or Organizations: Not on file  . Attends Club or Organization Meetings: Not on file  . Marital Status: Not on file  Intimate Partner Violence:   . Fear of  Current or Ex-Partner: Not on file  . Emotionally Abused: Not on file  . Physically Abused: Not on file  . Sexually Abused: Not on file     BP 92/64   Pulse 90   Ht 6' (1.829 m)   Wt 161 lb 3.2 oz (73.1 kg)   SpO2 (!) 87%   BMI 21.86 kg/m   Physical Exam:  Well appearing NAD HEENT: Unremarkable Neck:  No JVD, no thyromegally Lymphatics:  No adenopathy Back:  No CVA tenderness Lungs:  Clear with no wheezes HEART:  Regular rate rhythm, no murmurs, no rubs, no clicks Abd:  soft, positive bowel sounds, no organomegally, no rebound, no guarding Ext:  2 plus pulses, no edema, no cyanosis, no clubbing Skin:  No rashes no nodules Neuro:  CN II through XII intact, motor grossly intact  EKG - atrial flutter with a controlled VR  Assess/Plan: 1. Atrial flutter - he has had this on occaision but is now incessant. He is symptomatic. He will be started on systemic anti-coagulation with Eliquis and is instructed not to miss any. He is on flecainide.  2. PVC's - he appears to be well controlled on flecainide. I spent over 25 minutes discussed the ablation procedure with the patient.   Brixton Schnapp,M.D. 

## 2019-10-17 NOTE — Patient Instructions (Addendum)
Medication Instructions:  Start Xarelto 20mg  one tab daily with your largest meal  Labwork: CBC with diff and BMP today   Testing/Procedures: Your physician has recommended that you have an ablation. Catheter ablation is a medical procedure used to treat some cardiac arrhythmias (irregular heartbeats). During catheter ablation, a long, thin, flexible tube is put into a blood vessel in your groin (upper thigh), or neck. This tube is called an ablation catheter. It is then guided to your heart through the blood vessel. Radio frequency waves destroy small areas of heart tissue where abnormal heartbeats may cause an arrhythmia to start. Please see the instruction sheet given to you today.    Follow-Up:  SEE INSTRUCTION LETTER    Any Other Special Instructions Will Be Listed Below (If Applicable).  If you need a refill on your cardiac medications before your next appointment, please call your pharmacy.     Cardiac Ablation Cardiac ablation is a procedure to disable (ablate) a small amount of heart tissue in very specific places. The heart has many electrical connections. Sometimes these connections are abnormal and can cause the heart to beat very fast or irregularly. Ablating some of the problem areas can improve the heart rhythm or return it to normal. Ablation may be done for people who:  Have Wolff-Parkinson-White syndrome.  Have fast heart rhythms (tachycardia).  Have taken medicines for an abnormal heart rhythm (arrhythmia) that were not effective or caused side effects.  Have a high-risk heartbeat that may be life-threatening. During the procedure, a small incision is made in the neck or the groin, and a long, thin, flexible tube (catheter) is inserted into the incision and moved to the heart. Small devices (electrodes) on the tip of the catheter will send out electrical currents. A type of X-ray (fluoroscopy) will be used to help guide the catheter and to provide images of the  heart. Tell a health care provider about:  Any allergies you have.  All medicines you are taking, including vitamins, herbs, eye drops, creams, and over-the-counter medicines.  Any problems you or family members have had with anesthetic medicines.  Any blood disorders you have.  Any surgeries you have had.  Any medical conditions you have, such as kidney failure.  Whether you are pregnant or may be pregnant. What are the risks? Generally, this is a safe procedure. However, problems may occur, including:  Infection.  Bruising and bleeding at the catheter insertion site.  Bleeding into the chest, especially into the sac that surrounds the heart. This is a serious complication.  Stroke or blood clots.  Damage to other structures or organs.  Allergic reaction to medicines or dyes.  Need for a permanent pacemaker if the normal electrical system is damaged. A pacemaker is a small computer that sends electrical signals to the heart and helps your heart beat normally.  The procedure not being fully effective. This may not be recognized until months later. Repeat ablation procedures are sometimes required. What happens before the procedure?  Follow instructions from your health care provider about eating or drinking restrictions.  Ask your health care provider about: ? Changing or stopping your regular medicines. This is especially important if you are taking diabetes medicines or blood thinners. ? Taking medicines such as aspirin and ibuprofen. These medicines can thin your blood. Do not take these medicines before your procedure if your health care provider instructs you not to.  Plan to have someone take you home from the hospital or clinic.  If  you will be going home right after the procedure, plan to have someone with you for 24 hours. What happens during the procedure?  To lower your risk of infection: ? Your health care team will wash or sanitize their hands. ? Your  skin will be washed with soap. ? Hair may be removed from the incision area.  An IV tube will be inserted into one of your veins.  You will be given a medicine to help you relax (sedative).  The skin on your neck or groin will be numbed.  An incision will be made in your neck or your groin.  A needle will be inserted through the incision and into a large vein in your neck or groin.  A catheter will be inserted into the needle and moved to your heart.  Dye may be injected through the catheter to help your surgeon see the area of the heart that needs treatment.  Electrical currents will be sent from the catheter to ablate heart tissue in desired areas. There are three types of energy that may be used to ablate heart tissue: ? Heat (radiofrequency energy). ? Laser energy. ? Extreme cold (cryoablation).  When the necessary tissue has been ablated, the catheter will be removed.  Pressure will be held on the catheter insertion area to prevent excessive bleeding.  A bandage (dressing) will be placed over the catheter insertion area. The procedure may vary among health care providers and hospitals. What happens after the procedure?  Your blood pressure, heart rate, breathing rate, and blood oxygen level will be monitored until the medicines you were given have worn off.  Your catheter insertion area will be monitored for bleeding. You will need to lie still for a few hours to ensure that you do not bleed from the catheter insertion area.  Do not drive for 24 hours or as long as directed by your health care provider. Summary  Cardiac ablation is a procedure to disable (ablate) a small amount of heart tissue in very specific places. Ablating some of the problem areas can improve the heart rhythm or return it to normal.  During the procedure, electrical currents will be sent from the catheter to ablate heart tissue in desired areas. This information is not intended to replace advice  given to you by your health care provider. Make sure you discuss any questions you have with your health care provider. Document Released: 02/28/2009 Document Revised: 04/04/2018 Document Reviewed: 08/31/2016 Elsevier Patient Education  2020 [employer].

## 2019-11-03 ENCOUNTER — Other Ambulatory Visit: Payer: Self-pay | Admitting: Internal Medicine

## 2019-11-03 NOTE — Telephone Encounter (Signed)
New message    *STAT* If patient is at the pharmacy, call can be transferred to refill team.   1. Which medications need to be refilled? (please list name of each medication and dose if known) rivaroxaban (XARELTO) 20 MG TABS tablet  2. Which pharmacy/location (including street and city if local pharmacy) is medication to be sent to?[facility] [place], [place] AT [address]  3. Do they need a 30 day or 90 day supply? [place]

## 2019-11-06 ENCOUNTER — Other Ambulatory Visit: Payer: Medicare Other

## 2019-11-06 ENCOUNTER — Ambulatory Visit: Payer: Medicare Other | Attending: Internal Medicine

## 2019-11-06 DIAGNOSIS — Z20822 Contact with and (suspected) exposure to covid-19: Secondary | ICD-10-CM

## 2019-11-06 MED ORDER — RIVAROXABAN 20 MG PO TABS: 20.0000 mg | ORAL_TABLET | Freq: Every day | ORAL | 2 refills | Status: DC

## 2019-11-06 NOTE — Telephone Encounter (Signed)
Xarelto 20mg  refill request received. Pt is 69 years old, weight-73.1kg, Crea-0.86 on 10/17/2019, last seen by Dr. Lovena Le on 10/17/2019, Diagnosis-Afib, CrCl-82.88ml/min; Dose is appropriate based on dosing criteria. Will send in refill to requested pharmacy.

## 2019-11-06 NOTE — Addendum Note (Signed)
Addended by: Derrel Nip B on: 11/06/2019 09:25 AM   Modules accepted: Orders

## 2019-11-07 ENCOUNTER — Telehealth: Payer: Self-pay | Admitting: Internal Medicine

## 2019-11-07 LAB — NOVEL CORONAVIRUS, NAA: SARS-CoV-2, NAA: NOT DETECTED

## 2019-11-07 NOTE — Telephone Encounter (Signed)
Patient is calling with concerns that he will not need a 90 day supply of  rivaroxaban (XARELTO) 20 MG TABS tablet for after his surgery. Patient is inquiring if he should return medication and receive a 30 day supply instead. Please call.

## 2019-11-07 NOTE — Telephone Encounter (Signed)
Returned call to Pt.  Advised to get 30 day refills of Xarelto with upcoming aflutter ablation.  Pt indicates understanding.

## 2019-11-11 ENCOUNTER — Other Ambulatory Visit (HOSPITAL_COMMUNITY)
Admission: RE | Admit: 2019-11-11 | Discharge: 2019-11-11 | Disposition: A | Discharge status: Home / Self Care | Payer: Medicare Other | Source: Ambulatory Visit | Attending: Internal Medicine | Admitting: Internal Medicine

## 2019-11-11 DIAGNOSIS — Z01812 Encounter for preprocedural laboratory examination: Secondary | ICD-10-CM | POA: Insufficient documentation

## 2019-11-11 DIAGNOSIS — Z20822 Contact with and (suspected) exposure to covid-19: Secondary | ICD-10-CM | POA: Diagnosis not present

## 2019-11-11 LAB — SARS CORONAVIRUS 2 (TAT 6-24 HRS): SARS Coronavirus 2: NEGATIVE

## 2019-11-13 ENCOUNTER — Other Ambulatory Visit: Payer: Self-pay

## 2019-11-13 ENCOUNTER — Ambulatory Visit (HOSPITAL_COMMUNITY)
Admission: RE | Admit: 2019-11-13 | Discharge: 2019-11-13 | Disposition: A | Discharge status: Home / Self Care | Payer: Medicare Other | Attending: Internal Medicine | Admitting: Internal Medicine

## 2019-11-13 ENCOUNTER — Ambulatory Visit (HOSPITAL_COMMUNITY)
Admission: RE | Disposition: A | Discharge status: Home / Self Care | Payer: Self-pay | Source: Home / Self Care | Attending: Internal Medicine

## 2019-11-13 DIAGNOSIS — I4892 Unspecified atrial flutter: Secondary | ICD-10-CM | POA: Insufficient documentation

## 2019-11-13 DIAGNOSIS — I10 Essential (primary) hypertension: Secondary | ICD-10-CM | POA: Diagnosis not present

## 2019-11-13 DIAGNOSIS — F329 Major depressive disorder, single episode, unspecified: Secondary | ICD-10-CM | POA: Diagnosis not present

## 2019-11-13 DIAGNOSIS — I493 Ventricular premature depolarization: Secondary | ICD-10-CM | POA: Diagnosis not present

## 2019-11-13 DIAGNOSIS — K219 Gastro-esophageal reflux disease without esophagitis: Secondary | ICD-10-CM | POA: Insufficient documentation

## 2019-11-13 DIAGNOSIS — I4891 Unspecified atrial fibrillation: Secondary | ICD-10-CM | POA: Insufficient documentation

## 2019-11-13 HISTORY — PX: A-FLUTTER ABLATION: EP1230

## 2019-11-13 SURGERY — A-FLUTTER ABLATION

## 2019-11-13 MED ORDER — BUPIVACAINE HCL (PF) 0.25 % IJ SOLN
INTRAMUSCULAR | Status: AC
  Filled 2019-11-13: qty 30

## 2019-11-13 MED ORDER — SODIUM CHLORIDE 0.9% FLUSH: 3.0000 mL | INTRAVENOUS | Status: DC | PRN

## 2019-11-13 MED ORDER — HEPARIN (PORCINE) IN NACL 1000-0.9 UT/500ML-% IV SOLN
INTRAVENOUS | Status: AC
  Filled 2019-11-13: qty 500

## 2019-11-13 MED ORDER — FENTANYL CITRATE (PF) 100 MCG/2ML IJ SOLN
INTRAMUSCULAR | Status: AC
  Filled 2019-11-13: qty 2

## 2019-11-13 MED ORDER — IBUTILIDE FUMARATE 1 MG/10ML IV SOLN
INTRAVENOUS | Status: AC
  Filled 2019-11-13: qty 10

## 2019-11-13 MED ORDER — MIDAZOLAM HCL 5 MG/5ML IJ SOLN
INTRAMUSCULAR | Status: AC
  Filled 2019-11-13: qty 5

## 2019-11-13 MED ORDER — IBUTILIDE FUMARATE 1 MG/10ML IV SOLN
INTRAVENOUS | Status: AC | PRN
  Administered 2019-11-13: 1 mg via INTRAVENOUS

## 2019-11-13 MED ORDER — MAGNESIUM SULFATE 50 % IJ SOLN
INTRAMUSCULAR | Status: DC | PRN
  Administered 2019-11-13: 1 g via INTRAVENOUS

## 2019-11-13 MED ORDER — FENTANYL CITRATE (PF) 100 MCG/2ML IJ SOLN
INTRAMUSCULAR | Status: DC | PRN
  Administered 2019-11-13 (×5): 12.5 ug via INTRAVENOUS
  Administered 2019-11-13: 25 ug via INTRAVENOUS
  Administered 2019-11-13 (×4): 12.5 ug via INTRAVENOUS

## 2019-11-13 MED ORDER — ONDANSETRON HCL 4 MG/2ML IJ SOLN: 4.0000 mg | Freq: Four times a day (QID) | INTRAMUSCULAR | Status: DC | PRN

## 2019-11-13 MED ORDER — SODIUM CHLORIDE 0.9 % IV SOLN: 250.0000 mL | INTRAVENOUS | Status: DC | PRN

## 2019-11-13 MED ORDER — SODIUM CHLORIDE 0.9 % IV SOLN: INTRAVENOUS | Status: DC

## 2019-11-13 MED ORDER — LIDOCAINE HCL (PF) 1 % IJ SOLN
INTRAMUSCULAR | Status: AC
  Filled 2019-11-13: qty 60

## 2019-11-13 MED ORDER — HEPARIN (PORCINE) IN NACL 1000-0.9 UT/500ML-% IV SOLN
INTRAVENOUS | Status: DC | PRN
  Administered 2019-11-13: 500 mL

## 2019-11-13 MED ORDER — METOPROLOL TARTRATE 25 MG PO TABS: 6.2500 mg | ORAL_TABLET | ORAL | Status: DC

## 2019-11-13 MED ORDER — ACETAMINOPHEN 325 MG PO TABS: 650.0000 mg | ORAL_TABLET | ORAL | Status: DC | PRN

## 2019-11-13 MED ORDER — MIDAZOLAM HCL 5 MG/5ML IJ SOLN
INTRAMUSCULAR | Status: DC | PRN
  Administered 2019-11-13 (×11): 1 mg via INTRAVENOUS

## 2019-11-13 MED ORDER — FLECAINIDE ACETATE 100 MG PO TABS
100.0000 mg | ORAL_TABLET | Freq: Two times a day (BID) | ORAL | Status: DC
  Administered 2019-11-13: 100 mg via ORAL
  Filled 2019-11-13: qty 1

## 2019-11-13 MED ORDER — BUPIVACAINE HCL (PF) 0.25 % IJ SOLN
INTRAMUSCULAR | Status: DC | PRN
  Administered 2019-11-13: 30 mL

## 2019-11-13 MED ORDER — MAGNESIUM SULFATE 50 % IJ SOLN
INTRAMUSCULAR | Status: AC
  Filled 2019-11-13: qty 2

## 2019-11-13 MED ORDER — METOPROLOL TARTRATE 25 MG/10 ML ORAL SUSPENSION
6.2500 mg | ORAL | Status: AC
  Administered 2019-11-13: 6.25 mg via ORAL
  Filled 2019-11-13: qty 5

## 2019-11-13 SURGICAL SUPPLY — 9 items
BAG SNAP BAND KOVER 36X36 (MISCELLANEOUS) ×1 IMPLANT
CATH BLAZERPRIME XP (ABLATOR) ×1 IMPLANT
CATH JOSEPH QUAD ALLRED 6F REP (CATHETERS) ×2 IMPLANT
CATH POLARIS X REPROCESSED (CATHETERS) ×1 IMPLANT
PACK EP LATEX FREE (CUSTOM PROCEDURE TRAY) ×2
PACK EP LF (CUSTOM PROCEDURE TRAY) ×1 IMPLANT
PAD PRO RADIOLUCENT 2001M-C (PAD) ×3 IMPLANT
SHEATH PINNACLE 6F 10CM (SHEATH) ×1 IMPLANT
SHEATH PINNACLE 8F 10CM (SHEATH) ×2 IMPLANT

## 2019-11-13 NOTE — Interval H&P Note (Signed)
History and Physical Interval Note:  11/13/2019 8:08 AM  [name]  has presented today for surgery, with the diagnosis of aflutter.  The various methods of treatment have been discussed with the patient and family. After consideration of risks, benefits and other options for treatment, the patient has consented to  Procedure(s): A-FLUTTER ABLATION (N/A) as a surgical intervention.  [name] history has been reviewed, patient examined, no change in status, stable for surgery.  I have reviewed the patient's chart and labs.  Questions were answered to the patient's satisfaction.     Jacob Marsh

## 2019-11-13 NOTE — Progress Notes (Signed)
Pt ambulated in hall without any issues.  Discharge teaching completed with pt and son who both verbalize understanding.

## 2019-11-13 NOTE — Discharge Instructions (Signed)
Post procedure care instructions No driving for 4 days. No lifting over 5 lbs for 1 week. No vigorous or sexual activity for 1 week. You may return to work/your usual activities on 11/20/2019. Keep procedure site clean & dry. If you notice increased pain, swelling, bleeding or pus, call/return!  You may shower, but no soaking baths/hot tubs/pools for 1 week.     Cardiac Ablation, Care After This sheet gives you information about how to care for yourself after your procedure. Your health care provider may also give you more specific instructions. If you have problems or questions, contact your health care provider. What can I expect after the procedure? After the procedure, it is common to have:  Bruising around your puncture site.  Tenderness around your puncture site.  Skipped heartbeats.  Tiredness (fatigue). Follow these instructions at home: Puncture site care   Follow instructions from your health care provider about how to take care of your puncture site. Make sure you: ? Wash your hands with soap and water before you change your bandage (dressing). If soap and water are not available, use hand sanitizer. ? Change your dressing as told by your health care provider. ? Leave stitches (sutures), skin glue, or adhesive strips in place. These skin closures may need to stay in place for up to 2 weeks. If adhesive strip edges start to loosen and curl up, you may trim the loose edges. Do not remove adhesive strips completely unless your health care provider tells you to do that.  Check your puncture site every day for signs of infection. Check for: ? Redness, swelling, or pain. ? Fluid or blood. If your puncture site starts to bleed, lie down on your back, apply firm pressure to the area, and contact your health care provider. ? Warmth. ? Pus or a bad smell. Driving  Ask your health care provider when it is safe for you to drive again after the procedure.  Do not drive or use heavy  machinery while taking prescription pain medicine.  Do not drive for 24 hours if you were given a medicine to help you relax (sedative) during your procedure. Activity  Avoid activities that take a lot of effort for at least 3 days after your procedure.  Do not lift anything that is heavier than 10 lb (4.5 kg), or the limit that you are told, until your health care provider says that it is safe.  Return to your normal activities as told by your health care provider. Ask your health care provider what activities are safe for you. General instructions  Take over-the-counter and prescription medicines only as told by your health care provider.  Do not use any products that contain nicotine or tobacco, such as cigarettes and e-cigarettes. If you need help quitting, ask your health care provider.  Do not take baths, swim, or use a hot tub until your health care provider approves.  Do not drink alcohol for 24 hours after your procedure.  Keep all follow-up visits as told by your health care provider. This is important. Contact a health care provider if:  You have redness, mild swelling, or pain around your puncture site.  You have fluid or blood coming from your puncture site that stops after applying firm pressure to the area.  Your puncture site feels warm to the touch.  You have pus or a bad smell coming from your puncture site.  You have a fever.  You have chest pain or discomfort that spreads to  your neck, jaw, or arm.  You are sweating a lot.  You feel nauseous.  You have a fast or irregular heartbeat.  You have shortness of breath.  You are dizzy or light-headed and feel the need to lie down.  You have pain or numbness in the arm or leg closest to your puncture site. Get help right away if:  Your puncture site suddenly swells.  Your puncture site is bleeding and the bleeding does not stop after applying firm pressure to the area. These symptoms may represent a  serious problem that is an emergency. Do not wait to see if the symptoms will go away. Get medical help right away. Call your local emergency services (911 in the [place] Do not drive yourself to the hospital. Summary  After the procedure, it is normal to have bruising and tenderness at the puncture site in your groin, neck, or forearm.  Check your puncture site every day for signs of infection.  Get help right away if your puncture site is bleeding and the bleeding does not stop after applying firm pressure to the area. This is a medical emergency. This information is not intended to replace advice given to you by your health care provider. Make sure you discuss any questions you have with your health care provider. Document Revised: 09/24/2017 Document Reviewed: 01/21/2017 Elsevier Patient Education  [employer].

## 2019-11-13 NOTE — Progress Notes (Signed)
Site area: rt groin 3-fv sheaths Site Prior to Removal:  Level 0 Pressure Applied For: 20 minutes Manual:   yes Patient Status During Pull:  stable Post Pull Site:  Level 0 Post Pull Instructions Given:  yes Post Pull Pulses Present: rt dp palpable Dressing Applied:  Gauze and tegaderm Bedrest begins @ 1025 Comments: IV saline locked

## 2019-11-14 MED FILL — Lidocaine HCl Local Preservative Free (PF) Inj 1%: INTRAMUSCULAR | Qty: 60 | Status: AC

## 2019-11-29 ENCOUNTER — Telehealth: Payer: Self-pay | Admitting: Internal Medicine

## 2019-11-29 NOTE — Telephone Encounter (Signed)
Left detailed message advising of pharmacy recommendations.  Advised to call back if any further questions.    Will also send message via mychart.

## 2019-11-29 NOTE — Telephone Encounter (Signed)
Would have him take his next dose with lunch tomorrow (2/4), then resume normal evening dosing on 2/5.

## 2019-11-29 NOTE — Telephone Encounter (Signed)
Rerouted message to [place]

## 2019-11-29 NOTE — Telephone Encounter (Signed)
New message   Patient has question about rivaroxaban (XARELTO) 20 MG TABS tablet. He accidentally took it this morning instead of in the evening. Patient wants to know if skips the dose for today? Please advise.

## 2019-11-29 NOTE — Telephone Encounter (Signed)
Pt reports that he took his Xarelto dose at dinner last night and then again with breakfast in error and wants to know when his next dose should be based in the circumstances.. his message has already been sent to the [facility] will call pt back after their review.

## 2019-12-06 ENCOUNTER — Other Ambulatory Visit: Payer: Self-pay | Admitting: Internal Medicine

## 2019-12-06 MED ORDER — FLECAINIDE ACETATE 100 MG PO TABS: 100.0000 mg | ORAL_TABLET | Freq: Two times a day (BID) | ORAL | 3 refills | Status: DC

## 2019-12-13 ENCOUNTER — Encounter: Payer: Self-pay | Admitting: Internal Medicine

## 2019-12-13 ENCOUNTER — Ambulatory Visit (INDEPENDENT_AMBULATORY_CARE_PROVIDER_SITE_OTHER): Payer: Medicare Other | Admitting: Internal Medicine

## 2019-12-13 ENCOUNTER — Other Ambulatory Visit: Payer: Self-pay

## 2019-12-13 VITALS — BP 104/66 | HR 53 | Ht 72.0 in | Wt 161.0 lb

## 2019-12-13 DIAGNOSIS — I4892 Unspecified atrial flutter: Secondary | ICD-10-CM | POA: Diagnosis not present

## 2019-12-13 DIAGNOSIS — I493 Ventricular premature depolarization: Secondary | ICD-10-CM

## 2019-12-13 NOTE — Progress Notes (Signed)
HPI [name] returns today for followup. He is a pleasant 69 yo man with symptomatic PVC's and atrial fib who then developed highly symptomatic atrial flutter which was fairly incessant and for which he underwent EP study and catheter ablation several weeks ago. After about a week, he has felt well with less sob or palpitations. No syncope. He remains on flecainide. He wonders if he can stop his xarelto. He does not have HTN. His CHADSVASC score is 1.  No Known Allergies   Current Outpatient Medications  Medication Sig Dispense Refill  . acetaminophen (TYLENOL) 500 MG tablet Take 1,000 mg by mouth every 6 (six) hours as needed for moderate pain or headache.    . flecainide (TAMBOCOR) 100 MG tablet Take 1 tablet (100 mg total) by mouth 2 (two) times daily. May take an additional 1/2 tablet daily for breakthrough palpitations. 225 tablet 3  . ibuprofen (ADVIL) 200 MG tablet Take 400 mg by mouth every 6 (six) hours as needed for headache or moderate pain.    . metoprolol tartrate (LOPRESSOR) 25 MG tablet Take 1/2 tablet by mouth daily at bedtime.  May take an additional 1/2 tablet as needed for palpitations. 90 tablet 3  . Multiple Vitamin (MULTIVITAMIN WITH MINERALS) TABS Take 1 tablet by mouth daily.     . Omega-3 Fatty Acids (FISH OIL PO) Take 690 mg by mouth daily.     . Turmeric 500 MG CAPS Take 500 mg by mouth daily.     No current facility-administered medications for this visit.     Past Medical History:  Diagnosis Date  . Depression   . Diverticulosis    History of diverticulosis// flex sig 2006//colonoscopy 01/21/2000 diverticulosis  . GERD (gastroesophageal reflux disease)   . Hx of echocardiogram    Echo (9/14): EF 93%, grade 1 diastolic dysfunction, trivial AI, trivial MR, MAC, PASP 29  . PVC (premature ventricular contraction)   . Trigger finger of both hands     ROS:   All systems reviewed and negative except as noted in the HPI.   Past Surgical History:   Procedure Laterality Date  . A-FLUTTER ABLATION N/A 11/13/2019   Procedure: A-FLUTTER ABLATION;  Surgeon: Evans Lance, MD;  Location: [facility];  Service: Cardiovascular;  Laterality: N/A;  . DUPUYTREN CONTRACTURE RELEASE    . TONSILLECTOMY    . WISDOM TOOTH EXTRACTION       Family History  Problem Relation Age of Onset  . Arthritis Mother   . Heart disease Mother        ? atrial fib  . Depression Sister   . Colon polyps Father   . Cancer Maternal Aunt        ?colon cancer  . Heart disease Maternal Grandmother        CAD  . Cancer Maternal Grandmother        liver cancer  . Heart disease Maternal Grandfather        CAD  . Colon cancer Paternal Grandmother   . Sudden death Neg Hx      Social History   Socioeconomic History  . Marital status: Married    Spouse name: Not on file  . Number of children: Not on file  . Years of education: Not on file  . Highest education level: Not on file  Occupational History  . Not on file  Tobacco Use  . Smoking status: Never Smoker  . Smokeless tobacco: Never Used  Substance and Sexual Activity  . Alcohol use: Yes    Alcohol/week: 1.0 standard drinks    Types: 1 Glasses of wine per week    Comment: social 1 beer once monthly  . Drug use: No  . Sexual activity: Yes  Other Topics Concern  . Not on file  Social History Narrative   Wife has alzheimer's and he cares for her at home   Still working as well    Social Determinants of Health   Financial Resource Strain:   . Difficulty of Paying Living Expenses: Not on file  Food Insecurity:   . Worried About Charity fundraiser in the Last Year: Not on file  . Ran Out of Food in the Last Year: Not on file  Transportation Needs:   . Lack of Transportation (Medical): Not on file  . Lack of Transportation (Non-Medical): Not on file  Physical Activity:   . Days of Exercise per Week: Not on file  . Minutes of Exercise per Session: Not on file  Stress:   . Feeling of  Stress : Not on file  Social Connections:   . Frequency of Communication with Friends and Family: Not on file  . Frequency of Social Gatherings with Friends and Family: Not on file  . Attends Religious Services: Not on file  . Active Member of Clubs or Organizations: Not on file  . Attends Archivist Meetings: Not on file  . Marital Status: Not on file  Intimate Partner Violence:   . Fear of Current or Ex-Partner: Not on file  . Emotionally Abused: Not on file  . Physically Abused: Not on file  . Sexually Abused: Not on file     BP 104/66   Pulse (!) 53   Ht 6' (1.829 m)   Wt 161 lb (73 kg)   SpO2 99%   BMI 21.84 kg/m   Physical Exam:  Well appearing 69 yo man, NAD HEENT: Unremarkable Neck:  6 cm JVD, no thyromegally Lymphatics:  No adenopathy Back:  No CVA tenderness Lungs:  Clear with no wheezes HEART:  Regular rate rhythm, no murmurs, no rubs, no clicks Abd:  soft, positive bowel sounds, no organomegally, no rebound, no guarding Ext:  2 plus pulses, no edema, no cyanosis, no clubbing Skin:  No rashes no nodules Neuro:  CN II through XII intact, motor grossly intact  ECG - NSR  Assess/Plan: 1. Atrial flutter - he is doing well s/p catheter ablation.  2. Atrial fib - he is maintaining NSR on flecainide. He has a CHADSVASC score of 1. He will stop the xarelto. 3. PVC's - he has not had any significant since his ablation while on flecainide.  Jacob Marsh.D.

## 2019-12-13 NOTE — Patient Instructions (Addendum)

## 2020-01-01 ENCOUNTER — Telehealth: Payer: Self-pay | Admitting: Family Medicine

## 2020-01-01 DIAGNOSIS — N401 Enlarged prostate with lower urinary tract symptoms: Secondary | ICD-10-CM

## 2020-01-01 DIAGNOSIS — R55 Syncope and collapse: Secondary | ICD-10-CM

## 2020-01-01 DIAGNOSIS — Z0000 Encounter for general adult medical examination without abnormal findings: Secondary | ICD-10-CM

## 2020-01-01 DIAGNOSIS — Z13220 Encounter for screening for lipoid disorders: Secondary | ICD-10-CM

## 2020-01-01 DIAGNOSIS — I4891 Unspecified atrial fibrillation: Secondary | ICD-10-CM

## 2020-01-01 NOTE — Telephone Encounter (Signed)
-----   Message from Cloyd Stagers, RT sent at 12/21/2019  2:55 PM EST ----- Regarding: Lab Orders for Tuesday 3.9.2021 Please place lab orders for Tuesday 3.9.2021, office visit for physical on Friday 3.12.2021 Thank you, Dyke Maes RT(R)

## 2020-01-01 NOTE — Telephone Encounter (Signed)
Heads up = psa was not covered when I tried to order it  If he wants that he will have to sign the abn with a new order

## 2020-01-02 ENCOUNTER — Other Ambulatory Visit: Payer: Self-pay

## 2020-01-02 ENCOUNTER — Ambulatory Visit: Payer: Medicare Other

## 2020-01-02 ENCOUNTER — Other Ambulatory Visit (INDEPENDENT_AMBULATORY_CARE_PROVIDER_SITE_OTHER): Payer: Medicare Other

## 2020-01-02 ENCOUNTER — Other Ambulatory Visit: Payer: Self-pay | Admitting: Family Medicine

## 2020-01-02 ENCOUNTER — Ambulatory Visit (INDEPENDENT_AMBULATORY_CARE_PROVIDER_SITE_OTHER): Payer: Medicare Other

## 2020-01-02 VITALS — Wt 160.0 lb

## 2020-01-02 DIAGNOSIS — R350 Frequency of micturition: Secondary | ICD-10-CM | POA: Diagnosis not present

## 2020-01-02 DIAGNOSIS — R55 Syncope and collapse: Secondary | ICD-10-CM | POA: Diagnosis not present

## 2020-01-02 DIAGNOSIS — Z0000 Encounter for general adult medical examination without abnormal findings: Secondary | ICD-10-CM

## 2020-01-02 DIAGNOSIS — N401 Enlarged prostate with lower urinary tract symptoms: Secondary | ICD-10-CM

## 2020-01-02 DIAGNOSIS — I4891 Unspecified atrial fibrillation: Secondary | ICD-10-CM | POA: Diagnosis not present

## 2020-01-02 DIAGNOSIS — Z13220 Encounter for screening for lipoid disorders: Secondary | ICD-10-CM

## 2020-01-02 LAB — CBC WITH DIFFERENTIAL/PLATELET
Basophils Absolute: 0 10*3/uL (ref 0.0–0.1)
Basophils Relative: 0.4 % (ref 0.0–3.0)
Eosinophils Absolute: 0 10*3/uL (ref 0.0–0.7)
Eosinophils Relative: 0.5 % (ref 0.0–5.0)
HCT: 44 % (ref 39.0–52.0)
Hemoglobin: 14.7 g/dL (ref 13.0–17.0)
Lymphocytes Relative: 17.6 % (ref 12.0–46.0)
Lymphs Abs: 1.2 10*3/uL (ref 0.7–4.0)
MCHC: 33.4 g/dL (ref 30.0–36.0)
MCV: 97.4 fl (ref 78.0–100.0)
Monocytes Absolute: 0.6 10*3/uL (ref 0.1–1.0)
Monocytes Relative: 8.7 % (ref 3.0–12.0)
Neutro Abs: 4.8 10*3/uL (ref 1.4–7.7)
Neutrophils Relative %: 72.8 % (ref 43.0–77.0)
Platelets: 158 10*3/uL (ref 150.0–400.0)
RBC: 4.52 Mil/uL (ref 4.22–5.81)
RDW: 12.6 % (ref 11.5–15.5)
WBC: 6.6 10*3/uL (ref 4.0–10.5)

## 2020-01-02 LAB — COMPREHENSIVE METABOLIC PANEL
ALT: 17 U/L (ref 0–53)
AST: 20 U/L (ref 0–37)
Albumin: 4.1 g/dL (ref 3.5–5.2)
Alkaline Phosphatase: 47 U/L (ref 39–117)
BUN: 15 mg/dL (ref 6–23)
CO2: 30 mEq/L (ref 19–32)
Calcium: 9.2 mg/dL (ref 8.4–10.5)
Chloride: 102 mEq/L (ref 96–112)
Creatinine, Ser: 0.7 mg/dL (ref 0.40–1.50)
GFR: 111.38 mL/min (ref >60.00)
Glucose, Bld: 85 mg/dL (ref 70–99)
Potassium: 3.9 mEq/L (ref 3.5–5.1)
Sodium: 137 mEq/L (ref 135–145)
Total Bilirubin: 0.7 mg/dL (ref 0.2–1.2)
Total Protein: 6.5 g/dL (ref 6.0–8.3)

## 2020-01-02 LAB — PSA, MEDICARE: PSA: 1.93 ng/ml (ref 0.10–4.00)

## 2020-01-02 LAB — LIPID PANEL
Cholesterol: 160 mg/dL (ref 0–200)
HDL: 47.5 mg/dL (ref >39.00)
LDL Cholesterol: 98 mg/dL (ref 0–99)
NonHDL: 112.16
Total CHOL/HDL Ratio: 3
Triglycerides: 73 mg/dL (ref 0.0–149.0)
VLDL: 14.6 mg/dL (ref 0.0–40.0)

## 2020-01-02 LAB — TSH: TSH: 2.66 u[IU]/mL (ref 0.35–4.50)

## 2020-01-02 NOTE — Patient Instructions (Signed)
Jacob Marsh , Thank you for taking time to come for your Medicare Wellness Visit. I appreciate your ongoing commitment to your health goals. Please review the following plan we discussed and let me know if I can assist you in the future.   Screening recommendations/referrals: Colonoscopy: Up to date, completed 09/04/2015 Recommended yearly ophthalmology/optometry visit for glaucoma screening and checkup Recommended yearly dental visit for hygiene and checkup  Vaccinations: Influenza vaccine: Up to date, completed 08/08/2019 Pneumococcal vaccine: Completed series Tdap vaccine: decline Shingles vaccine: discussed    Advanced directives: Please bring a copy of your POA (Power of Attorney) and/or Living Will to your next appointment.  Conditions/risks identified: none  Next appointment: 01/05/2020 @ 10:15 am   Preventive Care 65 Years and Older, Male Preventive care refers to lifestyle choices and visits with your health care provider that can promote health and wellness. What does preventive care include?  A yearly physical exam. This is also called an annual well check.  Dental exams once or twice a year.  Routine eye exams. Ask your health care provider how often you should have your eyes checked.  Personal lifestyle choices, including:  Daily care of your teeth and gums.  Regular physical activity.  Eating a healthy diet.  Avoiding tobacco and drug use.  Limiting alcohol use.  Practicing safe sex.  Taking low doses of aspirin every day.  Taking vitamin and mineral supplements as recommended by your health care provider. What happens during an annual well check? The services and screenings done by your health care provider during your annual well check will depend on your age, overall health, lifestyle risk factors, and family history of disease. Counseling  Your health care provider may ask you questions about your:  Alcohol use.  Tobacco use.  Drug  use.  Emotional well-being.  Home and relationship well-being.  Sexual activity.  Eating habits.  History of falls.  Memory and ability to understand (cognition).  Work and work Statistician. Screening  You may have the following tests or measurements:  Height, weight, and BMI.  Blood pressure.  Lipid and cholesterol levels. These may be checked every 5 years, or more frequently if you are over 34 years old.  Skin check.  Lung cancer screening. You may have this screening every year starting at age 20 if you have a 30-pack-year history of smoking and currently smoke or have quit within the past 15 years.  Fecal occult blood test (FOBT) of the stool. You may have this test every year starting at age 66.  Flexible sigmoidoscopy or colonoscopy. You may have a sigmoidoscopy every 5 years or a colonoscopy every 10 years starting at age 38.  Prostate cancer screening. Recommendations will vary depending on your family history and other risks.  Hepatitis C blood test.  Hepatitis B blood test.  Sexually transmitted disease (STD) testing.  Diabetes screening. This is done by checking your blood sugar (glucose) after you have not eaten for a while (fasting). You may have this done every 1-3 years.  Abdominal aortic aneurysm (AAA) screening. You may need this if you are a current or former smoker.  Osteoporosis. You may be screened starting at age 32 if you are at high risk. Talk with your health care provider about your test results, treatment options, and if necessary, the need for more tests. Vaccines  Your health care provider may recommend certain vaccines, such as:  Influenza vaccine. This is recommended every year.  Tetanus, diphtheria, and acellular pertussis (Tdap,  Td) vaccine. You may need a Td booster every 10 years.  Zoster vaccine. You may need this after age 63.  Pneumococcal 13-valent conjugate (PCV13) vaccine. One dose is recommended after age  25.  Pneumococcal polysaccharide (PPSV23) vaccine. One dose is recommended after age 35. Talk to your health care provider about which screenings and vaccines you need and how often you need them. This information is not intended to replace advice given to you by your health care provider. Make sure you discuss any questions you have with your health care provider. Document Released: 11/08/2015 Document Revised: 07/01/2016 Document Reviewed: 08/13/2015 Elsevier Interactive Patient Education  2017 [place] Prevention in the Home Falls can cause injuries. They can happen to people of all ages. There are many things you can do to make your home safe and to help prevent falls. What can I do on the outside of my home?  Regularly fix the edges of walkways and driveways and fix any cracks.  Remove anything that might make you trip as you walk through a door, such as a raised step or threshold.  Trim any bushes or trees on the path to your home.  Use bright outdoor lighting.  Clear any walking paths of anything that might make someone trip, such as rocks or tools.  Regularly check to see if handrails are loose or broken. Make sure that both sides of any steps have handrails.  Any raised decks and porches should have guardrails on the edges.  Have any leaves, snow, or ice cleared regularly.  Use sand or salt on walking paths during winter.  Clean up any spills in your garage right away. This includes oil or grease spills. What can I do in the bathroom?  Use night lights.  Install grab bars by the toilet and in the tub and shower. Do not use towel bars as grab bars.  Use non-skid mats or decals in the tub or shower.  If you need to sit down in the shower, use a plastic, non-slip stool.  Keep the floor dry. Clean up any water that spills on the floor as soon as it happens.  Remove soap buildup in the tub or shower regularly.  Attach bath mats securely with double-sided  non-slip rug tape.  Do not have throw rugs and other things on the floor that can make you trip. What can I do in the bedroom?  Use night lights.  Make sure that you have a light by your bed that is easy to reach.  Do not use any sheets or blankets that are too big for your bed. They should not hang down onto the floor.  Have a firm chair that has side arms. You can use this for support while you get dressed.  Do not have throw rugs and other things on the floor that can make you trip. What can I do in the kitchen?  Clean up any spills right away.  Avoid walking on wet floors.  Keep items that you use a lot in easy-to-reach places.  If you need to reach something above you, use a strong step stool that has a grab bar.  Keep electrical cords out of the way.  Do not use floor polish or wax that makes floors slippery. If you must use wax, use non-skid floor wax.  Do not have throw rugs and other things on the floor that can make you trip. What can I do with my stairs?  Do not  leave any items on the stairs.  Make sure that there are handrails on both sides of the stairs and use them. Fix handrails that are broken or loose. Make sure that handrails are as long as the stairways.  Check any carpeting to make sure that it is firmly attached to the stairs. Fix any carpet that is loose or worn.  Avoid having throw rugs at the top or bottom of the stairs. If you do have throw rugs, attach them to the floor with carpet tape.  Make sure that you have a light switch at the top of the stairs and the bottom of the stairs. If you do not have them, ask someone to add them for you. What else can I do to help prevent falls?  Wear shoes that:  Do not have high heels.  Have rubber bottoms.  Are comfortable and fit you well.  Are closed at the toe. Do not wear sandals.  If you use a stepladder:  Make sure that it is fully opened. Do not climb a closed stepladder.  Make sure that both  sides of the stepladder are locked into place.  Ask someone to hold it for you, if possible.  Clearly mark and make sure that you can see:  Any grab bars or handrails.  First and last steps.  Where the edge of each step is.  Use tools that help you move around (mobility aids) if they are needed. These include:  Canes.  Walkers.  Scooters.  Crutches.  Turn on the lights when you go into a dark area. Replace any light bulbs as soon as they burn out.  Set up your furniture so you have a clear path. Avoid moving your furniture around.  If any of your floors are uneven, fix them.  If there are any pets around you, be aware of where they are.  Review your medicines with your doctor. Some medicines can make you feel dizzy. This can increase your chance of falling. Ask your doctor what other things that you can do to help prevent falls. This information is not intended to replace advice given to you by your health care provider. Make sure you discuss any questions you have with your health care provider. Document Released: 08/08/2009 Document Revised: 03/19/2016 Document Reviewed: 11/16/2014 Elsevier Interactive Patient Education  2017 [employer].

## 2020-01-02 NOTE — Telephone Encounter (Signed)
Ok, done 

## 2020-01-02 NOTE — Progress Notes (Signed)
PCP notes:  Health Maintenance: Tdap- insurance/financial   Abnormal Screenings: none   Patient concerns: Discuss vitamin supplements and bloodwork to check for alzheimer's   Discuss having colonoscopy done every 5 years due to family history    Nurse concerns: none   Next PCP appt.: 01/05/2020 @ 10:15 am

## 2020-01-02 NOTE — Progress Notes (Signed)
Subjective:   Jacob Marsh is a 69 y.o. male who presents for Medicare Annual/Subsequent preventive examination.  Review of Systems: N/A   This visit is being conducted through telemedicine via telephone at the nurse health advisor's home address due to the COVID-19 pandemic. This patient has given me verbal consent via doximity to conduct this visit, patient states they are participating from their home address. Patient and myself are on the telephone call. There is no referral for this visit. Some vital signs may be absent or patient reported.    Patient identification: identified by name, DOB, and current address   Cardiac Risk Factors include: advanced age (>69mn, >>41women);male gender     Objective:    Vitals: Wt 160 lb (72.6 kg)   BMI 21.70 kg/m   Body mass index is 21.7 kg/m.  Advanced Directives 01/02/2020 11/13/2019 09/14/2017 09/10/2016 08/21/2015  Does Patient Have a Medical Advance Directive? _0   Type of AParamedicof [place]Living will [place]ristowLiving will [place]indaleLiving will [place]Living will H[place]Living will  Does patient want to make changes to medical advance directive? - No - Patient declined - No - Patient declined No - Patient declined  Copy of HCamino Tassajarain Chart? No - copy requested No - copy requested No - copy requested No - copy requested No - copy requested    Tobacco Social History   Tobacco Use  Smoking Status Never Smoker  Smokeless Tobacco Never Used     Counseling given: Not Answered   Clinical Intake:  Pre-visit preparation completed: Yes  Pain : No/denies pain     Nutritional Risks: None Diabetes: No  How often do you need to have someone help you when you read instructions, pamphlets, or other written materials from your doctor or pharmacy?: 1 - Never What is the last grade level  you completed in school?: Bachelors  Interpreter Needed?: No  Information entered by :: CJohnson, LPN  Past Medical History:  Diagnosis Date  . Depression   . Diverticulosis    History of diverticulosis// flex sig 2006//colonoscopy 01/21/2000 diverticulosis  . GERD (gastroesophageal reflux disease)   . Hx of echocardiogram    Echo (9/14): EF 549% grade 1 diastolic dysfunction, trivial AI, trivial MR, MAC, PASP 29  . PVC (premature ventricular contraction)   . Trigger finger of both hands    Past Surgical History:  Procedure Laterality Date  . A-FLUTTER ABLATION N/A 11/13/2019   Procedure: A-FLUTTER ABLATION;  Surgeon: TEvans Lance MD;  [facility];  Service: Cardiovascular;  Laterality: N/A;  . DUPUYTREN CONTRACTURE RELEASE    . TONSILLECTOMY    . WISDOM TOOTH EXTRACTION     Family History  Problem Relation Age of Onset  . Arthritis Mother   . Heart disease Mother        ? atrial fib  . Depression Sister   . Colon polyps Father   . Cancer Maternal Aunt        ?colon cancer  . Heart disease Maternal Grandmother        CAD  . Cancer Maternal Grandmother        liver cancer  . Heart disease Maternal Grandfather        CAD  . Colon cancer Paternal Grandmother   . Sudden death Neg Hx    Social History   Socioeconomic History  . Marital  status: Married    Spouse name: Not on file  . Number of children: Not on file  . Years of education: Not on file  . Highest education level: Not on file  Occupational History  . Not on file  Tobacco Use  . Smoking status: Never Smoker  . Smokeless tobacco: Never Used  Substance and Sexual Activity  . Alcohol use: Yes    Alcohol/week: 7.0 standard drinks    Types: 7 Glasses of wine per week    Comment: 6 oz every evening   . Drug use: No  . Sexual activity: Yes  Other Topics Concern  . Not on file  Social History Narrative   Wife has alzheimer's and he cares for her at home   Still working as well     Social Determinants of Health   Financial Resource Strain: Low Risk   . Difficulty of Paying Living Expenses: Not hard at all  Food Insecurity: No Food Insecurity  . Worried About Charity fundraiser in the Last Year: Never true  . Ran Out of Food in the Last Year: Never true  Transportation Needs: No Transportation Needs  . Lack of Transportation (Medical): No  . Lack of Transportation (Non-Medical): No  Physical Activity: Sufficiently Active  . Days of Exercise per Week: 7 days  . Minutes of Exercise per Session: 50 min  Stress: No Stress Concern Present  . Feeling of Stress : Not at all  Social Connections:   . Frequency of Communication with Friends and Family: Not on file  . Frequency of Social Gatherings with Friends and Family: Not on file  . Attends Religious Services: Not on file  . Active Member of Clubs or Organizations: Not on file  . Attends Archivist Meetings: Not on file  . Marital Status: Not on file    Outpatient Encounter Medications as of 01/02/2020  Medication Sig  . acetaminophen (TYLENOL) 500 MG tablet Take 1,000 mg by mouth every 6 (six) hours as needed for moderate pain or headache.  . flecainide (TAMBOCOR) 100 MG tablet Take 1 tablet (100 mg total) by mouth 2 (two) times daily. May take an additional 1/2 tablet daily for breakthrough palpitations.  Marland Kitchen ibuprofen (ADVIL) 200 MG tablet Take 400 mg by mouth every 6 (six) hours as needed for headache or moderate pain.  . metoprolol tartrate (LOPRESSOR) 25 MG tablet Take 1/2 tablet by mouth daily at bedtime.  May take an additional 1/2 tablet as needed for palpitations.  . Multiple Vitamin (MULTIVITAMIN WITH MINERALS) TABS Take 1 tablet by mouth daily.   . Omega-3 Fatty Acids (FISH OIL PO) Take 690 mg by mouth daily.   . Turmeric 500 MG CAPS Take 500 mg by mouth daily.   No facility-administered encounter medications on file as of 01/02/2020.    Activities of Daily Living In your present state of  health, do you have any difficulty performing the following activities: 01/02/2020  Hearing? N  Vision? N  Difficulty concentrating or making decisions? N  Walking or climbing stairs? N  Dressing or bathing? N  Doing errands, shopping? N  Preparing Food and eating ? N  Using the Toilet? N  In the past six months, have you accidently leaked urine? N  Do you have problems with loss of bowel control? N  Managing your Medications? N  Managing your Finances? N  Housekeeping or managing your Housekeeping? N  Some recent data might be hidden    Patient Care  Team: Tower, Wynelle Fanny, MD as PCP - General Evans Lance, MD as PCP - Cardiology (Cardiology) Josue Hector, MD as Consulting Physician (Cardiology) Evans Lance, MD as Consulting Physician (Cardiology) Jola Schmidt, MD as Consulting Physician (Ophthalmology) Druscilla Brownie, MD as Consulting Physician (Dermatology) Vinnie Level, DDS, PA as Referring Physician (Dentistry)   Assessment:   This is a routine wellness examination for [place].  Exercise Activities and Dietary recommendations Current Exercise Habits: Home exercise routine, Type of exercise: walking, Time (Minutes): 45, Frequency (Times/Week): 7, Weekly Exercise (Minutes/Week): 315, Intensity: Moderate, Exercise limited by: None identified  Goals    . Increase physical activity     Starting 09/14/2016, I will attempt to exercise at least 30 min 3 days per week.     . Increase physical activity     Starting 09/14/2017, I will continue to do stretching for at least 30 min 5 days per week and to do weights for 20 min 3 days per week.     . Patient Stated     01/02/2020, I will continue to walk 2-3 miles everyday.        Fall Risk Fall Risk  01/02/2020 01/03/2019 09/14/2017 09/10/2016 12/11/2014  Falls in the past year? 1 1 No No Yes  Comment slipped on ice - - - -  Number falls in past yr: 0 0 - - 1  Injury with Fall? 0 0 - - -  Risk for fall due to : Medication  side effect - - - -  Follow up Falls evaluation completed;Falls prevention discussed - - - -   Is the patient's home free of loose throw rugs in walkways, pet beds, electrical cords, etc?   yes      Grab bars in the bathroom? yes      Handrails on the stairs?   yes      Adequate lighting?   yes  Timed Get Up and Go Performed: N/A  Depression Screen PHQ 2/9 Scores 01/02/2020 01/03/2019 09/14/2017 09/10/2016  PHQ - 2 Score 0 0 0 0  PHQ- 9 Score 0 - 2 -    Cognitive Function MMSE - Mini Mental State Exam 01/02/2020 09/14/2017 09/10/2016  Orientation to time _0 Orientation to Place _1 Registration _2 Attention/ Calculation 5 0 0  Recall _3 Language- name 2 objects - 0 0  Language- repeat _4 Language- follow 3 step command - 3 3  Language- read & follow direction - 0 0  Write a sentence - 0 0  Copy design - 0 0  Total score - 20 20  Mini Cog  Mini-Cog screen was completed. Maximum score is 22. A value of 0 denotes this part of the MMSE was not completed or the patient failed this part of the Mini-Cog screening.       Immunization History  Administered Date(s) Administered  . Fluad Quad(high Dose 65+) 08/08/2019  . Influenza Split 08/17/2012  . Influenza, High Dose Seasonal PF 09/10/2018  . Influenza,inj,Quad PF,6+ Mos 12/11/2014, 09/10/2016, 09/14/2017  . Pneumococcal Conjugate-13 09/10/2016  . Pneumococcal Polysaccharide-23 12/11/2014  . Td 09/26/1999, 03/19/2009    Qualifies for Shingles Vaccine: Yes   Screening Tests Health Maintenance  Topic Date Due  . TETANUS/TDAP  01/02/2024 (Originally 03/20/2019)  . COLONOSCOPY  09/03/2025  . INFLUENZA VACCINE  Completed  . Hepatitis C Screening  Completed  . PNA vac Low Risk Adult  Completed  Cancer Screenings: Lung: Low Dose CT Chest recommended if Age 52-80 years, 30 pack-year currently smoking OR have quit w/in 15 years. Patient does not qualify. Colorectal: completed 09/04/2015  Additional  Screenings:  Hepatitis C Screening: 09/10/2016      Plan:   Patient will continue to walk 2-3 miles everyday.   I have personally reviewed and noted the following in the patient's chart:   . Medical and social history . Use of alcohol, tobacco or illicit drugs  . Current medications and supplements . Functional ability and status . Nutritional status . Physical activity . Advanced directives . List of other physicians . Hospitalizations, surgeries, and ER visits in previous 12 months . Vitals . Screenings to include cognitive, depression, and falls . Referrals and appointments  In addition, I have reviewed and discussed with patient certain preventive protocols, quality metrics, and best practice recommendations. A written personalized care plan for preventive services as well as general preventive health recommendations were provided to patient.     Andrez Grime, LPN  06/29/8545

## 2020-01-05 ENCOUNTER — Encounter: Payer: Medicare Other | Admitting: Family Medicine

## 2020-01-15 ENCOUNTER — Other Ambulatory Visit: Payer: Self-pay

## 2020-01-15 ENCOUNTER — Ambulatory Visit (INDEPENDENT_AMBULATORY_CARE_PROVIDER_SITE_OTHER): Payer: Medicare Other | Admitting: Family Medicine

## 2020-01-15 ENCOUNTER — Encounter: Payer: Self-pay | Admitting: Family Medicine

## 2020-01-15 VITALS — BP 116/66 | HR 57 | Temp 97.6°F | Ht 71.5 in | Wt 159.1 lb

## 2020-01-15 DIAGNOSIS — M720 Palmar fascial fibromatosis [Dupuytren]: Secondary | ICD-10-CM | POA: Diagnosis not present

## 2020-01-15 DIAGNOSIS — I4891 Unspecified atrial fibrillation: Secondary | ICD-10-CM | POA: Diagnosis not present

## 2020-01-15 DIAGNOSIS — Z13220 Encounter for screening for lipoid disorders: Secondary | ICD-10-CM

## 2020-01-15 DIAGNOSIS — N401 Enlarged prostate with lower urinary tract symptoms: Secondary | ICD-10-CM

## 2020-01-15 DIAGNOSIS — R350 Frequency of micturition: Secondary | ICD-10-CM

## 2020-01-15 NOTE — Assessment & Plan Note (Signed)
Pt will likely start planning surgery for each hand this year

## 2020-01-15 NOTE — Assessment & Plan Note (Signed)
Pt had a recent ablation  Doing very well  Continues flecinide and metoprolol Careful to watch out for orthostatic hypotension

## 2020-01-15 NOTE — Assessment & Plan Note (Signed)
Disc goals for lipids and reasons to control them Rev last labs with pt Rev low sat fat diet in detail  Good profile  Excellent plant based diet and exercise

## 2020-01-15 NOTE — Progress Notes (Signed)
Subjective:    Patient ID: Jacob Marsh, male    DOB: [birth year], 69 y.o.   MRN: [id number]  This visit occurred during the SARS-CoV-2 public health emergency.  Safety protocols were in place, including screening questions prior to the visit, additional usage of staff PPE, and extensive cleaning of exam room while observing appropriate contact time as indicated for disinfecting solutions.    HPI Here for annual f/u of chronic medical problems   Wt Readings from Last 3 Encounters:  01/15/20 159 lb 1 oz (72.2 kg)  01/02/20 160 lb (72.6 kg)  12/13/19 161 lb (73 kg)  stable weight  Does well with exercise - walks 2-3 mi per day   (has a new job)   21.88 kg/m   Appetite is good  Diet is pretty good / he is close to vegetarian (it is a challenge at times)  Eats a lot of nuts and fruit and whole grains  Has a protein supplement (20 g a dose)   Would like to slow down at work  Not planning retirement  He works 2 jobs (real estate and insurance)   Things are going well overall  Feeling pretty good overall   Staying very busy- working  Last week able to see his grandson - has to have open heart surgery   Had amw on 01/02/20 Recommended colon cancer screening in light of family hx Father -colon polyps Aunt colon cancer    Zoster status -interested in shingrix vaccine  Had both covid vaccines   Colonoscopy 11/16 with 5 y recall   Considering hand surgery for dupytren's   Prostate health H/o BPH  Has done a bit better overall from symptoms  Nocturia- not every night  Lab Results  Component Value Date   PSA 1.93 01/02/2020   PSA 2.38 12/29/2018   PSA 1.75 09/14/2017    H/o a fib  Taking flecainide and a beta blocker  Had an ablation recently and it worked (it has gone well so far)    BP Readings from Last 3 Encounters:  01/15/20 116/66  12/13/19 104/66  11/13/19 110/66   Pulse Readings from Last 3 Encounters:  01/15/20 (!) 57  12/13/19 (!) 53  11/13/19 (!)  51   Lipid screen Lab Results  Component Value Date   CHOL 160 01/02/2020   CHOL 169 12/29/2018   CHOL 183 09/14/2017   Lab Results  Component Value Date   HDL 47.50 01/02/2020   HDL 59.40 12/29/2018   HDL 54.80 09/14/2017   Lab Results  Component Value Date   LDLCALC 98 01/02/2020   [place] 95 12/29/2018   [place] 118 (H) 09/14/2017   Lab Results  Component Value Date   TRIG 73.0 01/02/2020   TRIG 74.0 12/29/2018   TRIG 51.0 09/14/2017   Lab Results  Component Value Date   CHOLHDL 3 01/02/2020   CHOLHDL 3 12/29/2018   CHOLHDL 3 09/14/2017   No results found for: LDLDIRECT Vegetarian diet - doing well   Other labs Results for orders placed or performed in visit on 01/02/20  PSA, Medicare  Result Value Ref Range   PSA 1.93 0.10 - 4.00 ng/ml  TSH  Result Value Ref Range   TSH 2.66 0.35 - 4.50 uIU/mL  Lipid panel  Result Value Ref Range   Cholesterol 160 0 - 200 mg/dL   Triglycerides 73.0 0.0 - 149.0 mg/dL   HDL 47.50 >39.00 mg/dL   VLDL 14.6 0.0 - 40.0 mg/dL  LDL Cholesterol 98 0 - 99 mg/dL   Total CHOL/HDL Ratio 3    NonHDL 112.16   Comprehensive metabolic panel  Result Value Ref Range   Sodium 137 135 - 145 mEq/L   Potassium 3.9 3.5 - 5.1 mEq/L   Chloride 102 96 - 112 mEq/L   CO2 30 19 - 32 mEq/L   Glucose, Bld 85 70 - 99 mg/dL   BUN 15 6 - 23 mg/dL   Creatinine, Ser 0.70 0.40 - 1.50 mg/dL   Total Bilirubin 0.7 0.2 - 1.2 mg/dL   Alkaline Phosphatase 47 39 - 117 U/L   AST 20 0 - 37 U/L   ALT 17 0 - 53 U/L   Total Protein 6.5 6.0 - 8.3 g/dL   Albumin 4.1 3.5 - 5.2 g/dL   GFR 111.38 >60.00 mL/min   Calcium 9.2 8.4 - 10.5 mg/dL  CBC with Differential/Platelet  Result Value Ref Range   WBC 6.6 4.0 - 10.5 K/uL   RBC 4.52 4.22 - 5.81 Mil/uL   Hemoglobin 14.7 13.0 - 17.0 g/dL   HCT 44.0 39.0 - 52.0 %   MCV 97.4 78.0 - 100.0 fl   MCHC 33.4 30.0 - 36.0 g/dL   RDW 12.6 11.5 - 15.5 %   Platelets 158.0 150.0 - 400.0 K/uL   Neutrophils Relative %  72.8 43.0 - 77.0 %   Lymphocytes Relative 17.6 12.0 - 46.0 %   Monocytes Relative 8.7 3.0 - 12.0 %   Eosinophils Relative 0.5 0.0 - 5.0 %   Basophils Relative 0.4 0.0 - 3.0 %   Neutro Abs 4.8 1.4 - 7.7 K/uL   Lymphs Abs 1.2 0.7 - 4.0 K/uL   Monocytes Absolute 0.6 0.1 - 1.0 K/uL   Eosinophils Absolute 0.0 0.0 - 0.7 K/uL   Basophils Absolute 0.0 0.0 - 0.1 K/uL    He is interested in seeing a holistic doctor in the future  Also interested in ways to keep his cognition in good shape   Patient Active Problem List   Diagnosis Date Noted  . A-fib ([place]) 10/17/2019  . Atrial flutter ([place]) 10/17/2019  . Dupuytren contracture 12/29/2017  . Trigger finger of both hands   . Hx of echocardiogram   . Diverticulosis   . Depression   . BPH (benign prostatic hyperplasia) 09/15/2016  . Need for hepatitis C screening test 09/07/2016  . Encounter for Medicare annual wellness exam 12/11/2014  . Screening for lipoid disorders 12/03/2014  . Hoarse 07/17/2014  . Globus sensation 07/17/2014  . Syncope 04/24/2014  . PVCs (premature ventricular contractions) 03/20/2014  . Postural dizziness 03/20/2014  . Palpitations 08/10/2013  . History of chest pain 08/17/2012  . Routine general medical examination at a health care facility 08/09/2012  . Prostate cancer screening 08/09/2012   Past Medical History:  Diagnosis Date  . Depression   . Diverticulosis    History of diverticulosis// flex sig 2006//colonoscopy 01/21/2000 diverticulosis  . GERD (gastroesophageal reflux disease)   . Hx of echocardiogram    Echo (9/14): EF 82%, grade 1 diastolic dysfunction, trivial AI, trivial MR, MAC, PASP 29  . PVC (premature ventricular contraction)   . Trigger finger of both hands    Past Surgical History:  Procedure Laterality Date  . A-FLUTTER ABLATION N/A 11/13/2019   Procedure: A-FLUTTER ABLATION;  Surgeon: Evans Lance, MD;  Location: [facility];  Service: Cardiovascular;  Laterality: N/A;  .  DUPUYTREN CONTRACTURE RELEASE    . TONSILLECTOMY    .  WISDOM TOOTH EXTRACTION     Social History   Tobacco Use  . Smoking status: Never Smoker  . Smokeless tobacco: Never Used  Substance Use Topics  . Alcohol use: Yes    Alcohol/week: 7.0 standard drinks    Types: 7 Glasses of wine per week    Comment: 6 oz every evening   . Drug use: No   Family History  Problem Relation Age of Onset  . Arthritis Mother   . Heart disease Mother        ? atrial fib  . Depression Sister   . Colon polyps Father   . Cancer Maternal Aunt        ?colon cancer  . Heart disease Maternal Grandmother        CAD  . Cancer Maternal Grandmother        liver cancer  . Heart disease Maternal Grandfather        CAD  . Colon cancer Paternal Grandmother   . Sudden death Neg Hx    No Known Allergies Current Outpatient Medications on File Prior to Visit  Medication Sig Dispense Refill  . acetaminophen (TYLENOL) 500 MG tablet Take 1,000 mg by mouth every 6 (six) hours as needed for moderate pain or headache.    . flecainide (TAMBOCOR) 100 MG tablet Take 1 tablet (100 mg total) by mouth 2 (two) times daily. May take an additional 1/2 tablet daily for breakthrough palpitations. 225 tablet 3  . ibuprofen (ADVIL) 200 MG tablet Take 400 mg by mouth every 6 (six) hours as needed for headache or moderate pain.    . metoprolol tartrate (LOPRESSOR) 25 MG tablet Take 1/2 tablet by mouth daily at bedtime.  May take an additional 1/2 tablet as needed for palpitations. 90 tablet 3  . Multiple Vitamin (MULTIVITAMIN WITH MINERALS) TABS Take 1 tablet by mouth daily.     . Omega-3 Fatty Acids (FISH OIL PO) Take 690 mg by mouth daily.     . Turmeric 500 MG CAPS Take 500 mg by mouth daily.     No current facility-administered medications on file prior to visit.     Review of Systems  Constitutional: Negative for activity change, appetite change, fatigue, fever and unexpected weight change.  HENT: Negative for congestion,  rhinorrhea, sore throat and trouble swallowing.   Eyes: Negative for pain, redness, itching and visual disturbance.  Respiratory: Negative for cough, chest tightness, shortness of breath and wheezing.   Cardiovascular: Negative for chest pain, palpitations and leg swelling.       Palpitations are resolved  Had recent ablation for a fib  Gastrointestinal: Negative for abdominal pain, blood in stool, constipation, diarrhea and nausea.  Endocrine: Negative for cold intolerance, heat intolerance, polydipsia and polyuria.  Genitourinary: Negative for difficulty urinating, dysuria, frequency and urgency.  Musculoskeletal: Negative for arthralgias, joint swelling and myalgias.       Contractures in hands from dupuytren's are worse   Skin: Negative for pallor and rash.       He sees dermatology for a skin screen yearly  Neurological: Negative for dizziness, tremors, weakness, numbness and headaches.  Hematological: Negative for adenopathy. Does not bruise/bleed easily.  Psychiatric/Behavioral: Negative for decreased concentration and dysphoric mood. The patient is not nervous/anxious.        Objective:   Physical Exam Constitutional:      General: He is not in acute distress.    Appearance: Normal appearance. He is well-developed. He is not ill-appearing or diaphoretic.  HENT:     Head: Normocephalic and atraumatic.     Right Ear: Tympanic membrane, ear canal and external ear normal.     Left Ear: Tympanic membrane, ear canal and external ear normal.     Nose: Nose normal. No congestion.     Mouth/Throat:     Mouth: Mucous membranes are moist.     Pharynx: Oropharynx is clear. No posterior oropharyngeal erythema.  Eyes:     General: No scleral icterus.       Right eye: No discharge.        Left eye: No discharge.     Conjunctiva/sclera: Conjunctivae normal.     Pupils: Pupils are equal, round, and reactive to light.  Neck:     Thyroid: No thyromegaly.     Vascular: No carotid bruit or  JVD.  Cardiovascular:     Rate and Rhythm: Normal rate and regular rhythm.     Pulses: Normal pulses.     Heart sounds: Normal heart sounds. No gallop.   Pulmonary:     Effort: Pulmonary effort is normal. No respiratory distress.     Breath sounds: Normal breath sounds. No wheezing or rales.     Comments: Good air exch Chest:     Chest wall: No tenderness.  Abdominal:     General: Bowel sounds are normal. There is no distension or abdominal bruit.     Palpations: Abdomen is soft. There is no mass.     Tenderness: There is no abdominal tenderness.     Hernia: No hernia is present.  Musculoskeletal:        General: No tenderness.     Cervical back: Normal range of motion and neck supple. No rigidity. No muscular tenderness.     Right lower leg: No edema.     Left lower leg: No edema.     Comments: Baseline dupuytren's contractures in hands   Solar lentigines diffusely   Lymphadenopathy:     Cervical: No cervical adenopathy.  Skin:    General: Skin is warm and dry.     Coloration: Skin is not pale.     Findings: No erythema or rash.  Neurological:     Mental Status: He is alert.     Cranial Nerves: No cranial nerve deficit.     Motor: No abnormal muscle tone.     Coordination: Coordination normal.     Gait: Gait normal.     Deep Tendon Reflexes: Reflexes are normal and symmetric. Reflexes normal.  Psychiatric:        Mood and Affect: Mood normal.        Cognition and Memory: Cognition and memory normal.     Comments: Mentally sharp            Assessment & Plan:   Problem List Items Addressed This Visit      Cardiovascular and Mediastinum   A-fib ([place])    Pt had a recent ablation  Doing very well  Continues flecinide and metoprolol Careful to watch out for orthostatic hypotension         Genitourinary   BPH (benign prostatic hyperplasia) - Primary    Symptoms have improved recently  No medications for voiding  Lab Results  Component Value Date   PSA 1.93  01/02/2020   PSA 2.38 12/29/2018   PSA 1.75 09/14/2017            Other   Screening for lipoid disorders    Disc goals for lipids  and reasons to control them Rev last labs with pt Rev low sat fat diet in detail  Good profile  Excellent plant based diet and exercise       Dupuytren contracture    Pt will likely start planning surgery for each hand this year

## 2020-01-15 NOTE — Assessment & Plan Note (Addendum)
Symptoms have improved recently  No medications for voiding  Lab Results  Component Value Date   PSA 1.93 01/02/2020   PSA 2.38 12/29/2018   PSA 1.75 09/14/2017

## 2020-01-15 NOTE — Patient Instructions (Addendum)
If you are interested in the new shingles vaccine (Shingrix) - call your local pharmacy to check on coverage and availability  If affordable, get on a wait list at your pharmacy to get the vaccine.  A 5 year recall for colonoscopy is going to be in November 2021   Keep taking good care of yourself   Look for the book  KEEP SHARP by Coral Spikes   Also the app Calm for meditation   Keep exercising and consider a beginning yoga program (on line or dvd)

## 2020-03-14 DIAGNOSIS — D692 Other nonthrombocytopenic purpura: Secondary | ICD-10-CM | POA: Diagnosis not present

## 2020-03-21 DIAGNOSIS — M13849 Other specified arthritis, unspecified hand: Secondary | ICD-10-CM | POA: Diagnosis not present

## 2020-03-21 DIAGNOSIS — M79641 Pain in right hand: Secondary | ICD-10-CM | POA: Diagnosis not present

## 2020-03-21 DIAGNOSIS — M720 Palmar fascial fibromatosis [Dupuytren]: Secondary | ICD-10-CM | POA: Diagnosis not present

## 2020-03-21 DIAGNOSIS — M79642 Pain in left hand: Secondary | ICD-10-CM | POA: Diagnosis not present

## 2020-07-15 ENCOUNTER — Other Ambulatory Visit: Payer: Self-pay

## 2020-07-15 ENCOUNTER — Encounter: Payer: Self-pay | Admitting: Internal Medicine

## 2020-07-15 ENCOUNTER — Ambulatory Visit (INDEPENDENT_AMBULATORY_CARE_PROVIDER_SITE_OTHER): Payer: Medicare Other | Admitting: Internal Medicine

## 2020-07-15 VITALS — BP 98/62 | HR 59 | Ht 71.0 in | Wt 159.2 lb

## 2020-07-15 DIAGNOSIS — I4891 Unspecified atrial fibrillation: Secondary | ICD-10-CM

## 2020-07-15 DIAGNOSIS — I4892 Unspecified atrial flutter: Secondary | ICD-10-CM | POA: Diagnosis not present

## 2020-07-15 DIAGNOSIS — I493 Ventricular premature depolarization: Secondary | ICD-10-CM

## 2020-07-15 NOTE — Patient Instructions (Signed)

## 2020-07-15 NOTE — Progress Notes (Signed)
HPI [name] returns today for followup. He is a pleasant 69 yo man with a h/o HTN, PAF, and PVC's. He is s/p catheter ablation and has done well. He has minimal palpitations where as before the ablation was in atrial flutter constantly.  No Known Allergies   Current Outpatient Medications  Medication Sig Dispense Refill  . acetaminophen (TYLENOL) 500 MG tablet Take 1,000 mg by mouth every 6 (six) hours as needed for moderate pain or headache.    . flecainide (TAMBOCOR) 100 MG tablet Take 1 tablet (100 mg total) by mouth 2 (two) times daily. May take an additional 1/2 tablet daily for breakthrough palpitations. 225 tablet 3  . ibuprofen (ADVIL) 200 MG tablet Take 400 mg by mouth every 6 (six) hours as needed for headache or moderate pain.    . metoprolol tartrate (LOPRESSOR) 25 MG tablet Take 1/2 tablet by mouth daily at bedtime.  May take an additional 1/2 tablet as needed for palpitations. 90 tablet 3  . Multiple Vitamin (MULTIVITAMIN WITH MINERALS) TABS Take 1 tablet by mouth daily.     . Omega-3 Fatty Acids (FISH OIL PO) Take 690 mg by mouth daily.     . Turmeric 500 MG CAPS Take 500 mg by mouth daily.     No current facility-administered medications for this visit.     Past Medical History:  Diagnosis Date  . Depression   . Diverticulosis    History of diverticulosis// flex sig 2006//colonoscopy 01/21/2000 diverticulosis  . GERD (gastroesophageal reflux disease)   . Hx of echocardiogram    Echo (9/14): EF 56%, grade 1 diastolic dysfunction, trivial AI, trivial MR, MAC, PASP 29  . PVC (premature ventricular contraction)   . Trigger finger of both hands     ROS:   All systems reviewed and negative except as noted in the HPI.   Past Surgical History:  Procedure Laterality Date  . A-FLUTTER ABLATION N/A 11/13/2019   Procedure: A-FLUTTER ABLATION;  Surgeon: Evans Lance, MD;  Location: [facility];  Service: Cardiovascular;  Laterality: N/A;  . DUPUYTREN  CONTRACTURE RELEASE    . TONSILLECTOMY    . WISDOM TOOTH EXTRACTION       Family History  Problem Relation Age of Onset  . Arthritis Mother   . Heart disease Mother        ? atrial fib  . Depression Sister   . Colon polyps Father   . Cancer Maternal Aunt        ?colon cancer  . Heart disease Maternal Grandmother        CAD  . Cancer Maternal Grandmother        liver cancer  . Heart disease Maternal Grandfather        CAD  . Colon cancer Paternal Grandmother   . Sudden death Neg Hx      Social History   Socioeconomic History  . Marital status: Married    Spouse name: Not on file  . Number of children: Not on file  . Years of education: Not on file  . Highest education level: Not on file  Occupational History  . Not on file  Tobacco Use  . Smoking status: Never Smoker  . Smokeless tobacco: Never Used  Vaping Use  . Vaping Use: Never used  Substance and Sexual Activity  . Alcohol use: Yes    Alcohol/week: 7.0 standard drinks    Types: 7 Glasses of wine per week  Comment: 6 oz every evening   . Drug use: No  . Sexual activity: Yes  Other Topics Concern  . Not on file  Social History Narrative   Wife has alzheimer's and he cares for her at home   Still working as well    Social Determinants of Health   Financial Resource Strain: Low Risk   . Difficulty of Paying Living Expenses: Not hard at all  Food Insecurity: No Food Insecurity  . Worried About Charity fundraiser in the Last Year: Never true  . Ran Out of Food in the Last Year: Never true  Transportation Needs: No Transportation Needs  . Lack of Transportation (Medical): No  . Lack of Transportation (Non-Medical): No  Physical Activity: Sufficiently Active  . Days of Exercise per Week: 7 days  . Minutes of Exercise per Session: 50 min  Stress: No Stress Concern Present  . Feeling of Stress : Not at all  Social Connections:   . Frequency of Communication with Friends and Family: Not on file  .  Frequency of Social Gatherings with Friends and Family: Not on file  . Attends Religious Services: Not on file  . Active Member of Clubs or Organizations: Not on file  . Attends Archivist Meetings: Not on file  . Marital Status: Not on file  Intimate Partner Violence: Not At Risk  . Fear of Current or Ex-Partner: No  . Emotionally Abused: No  . Physically Abused: No  . Sexually Abused: No     BP 98/62   Pulse (!) 59   Ht _0  (1.803 m)   Wt 159 lb 3.2 oz (72.2 kg)   SpO2 97%   BMI 22.20 kg/m   Physical Exam:  Well appearing 69 yo man, NAD HEENT: Unremarkable Neck:  6 cm JVD, no thyromegally Lymphatics:  No adenopathy Back:  No CVA tenderness Lungs:  Clear with no wheezes HEART:  Regular rate rhythm, no murmurs, no rubs, no clicks Abd:  soft, positive bowel sounds, no organomegally, no rebound, no guarding Ext:  2 plus pulses, no edema, no cyanosis, no clubbing Skin:  No rashes no nodules Neuro:  CN II through XII intact, motor grossly intact  EKG - sinus bradycardia   Assess/Plan: 1. Atrial flutter - he is asymptomatic, s/p catheter ablation. 2. Atrial fib - he is maintaining NSR very nicely. 3. PVC's - he is asymptomatic. Continue low dose beta blocker and flecainide. 4. 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 Not too symptomatic at present.  Jacob Marsh Jacob Stoermer,MD

## 2020-07-17 NOTE — Addendum Note (Signed)
Addended by: Campbell Riches on: 07/17/2020 08:27 AM   Modules accepted: Orders

## 2020-08-16 DIAGNOSIS — Z23 Encounter for immunization: Secondary | ICD-10-CM | POA: Diagnosis not present

## 2020-08-21 ENCOUNTER — Other Ambulatory Visit: Payer: Self-pay

## 2020-08-21 ENCOUNTER — Ambulatory Visit (HOSPITAL_COMMUNITY)
Admission: EM | Admit: 2020-08-21 | Discharge: 2020-08-21 | Disposition: A | Discharge status: Home / Self Care | Payer: Medicare Other | Attending: Family Medicine | Admitting: Family Medicine

## 2020-08-21 ENCOUNTER — Encounter (HOSPITAL_COMMUNITY): Payer: Self-pay | Admitting: Emergency Medicine

## 2020-08-21 DIAGNOSIS — S91311A Laceration without foreign body, right foot, initial encounter: Secondary | ICD-10-CM

## 2020-08-21 MED ORDER — LIDOCAINE HCL 2 % IJ SOLN
INTRAMUSCULAR | Status: AC
  Filled 2020-08-21: qty 20

## 2020-08-21 NOTE — ED Triage Notes (Signed)
Pt states he dropped a carving knife and it sliced the top of his right foot. Pt states he believes he is up to date on his tetanus shot. Pt states he cleaned it with peroxide and wrapped it so bleeding is controlled.

## 2020-08-21 NOTE — Discharge Instructions (Signed)
Keep steri strips as long as possible in place, ideally around 5 days at least.  May get wet, but try to limit agitation to the area.  Please return for any signs of infection- redness, warmth, swelling or pus drainage

## 2020-08-23 DIAGNOSIS — Z23 Encounter for immunization: Secondary | ICD-10-CM | POA: Diagnosis not present

## 2020-08-23 NOTE — ED Provider Notes (Signed)
[facility]    CSN: [id number] Arrival date & time: 08/21/20  1939      History   Chief Complaint Chief Complaint  Patient presents with  . Laceration    HPI Jacob Marsh is a 69 y.o. male.   Jacob Marsh presents with complaints of laceration to dorsal aspect of right foot. Accidentally knocked a clean kitchen knife off of a table, striking the foot and causing this injury. No numbness or tinging to toes or foot. Bleeding controlled at this time. He is not on any blood thinners. He feels his tdap is UTD in the past 5 years. He did cleanse wound with hydrogen peroxide.    ROS per HPI, negative if not otherwise mentioned.      Past Medical History:  Diagnosis Date  . Depression   . Diverticulosis    History of diverticulosis// flex sig 2006//colonoscopy 01/21/2000 diverticulosis  . GERD (gastroesophageal reflux disease)   . Hx of echocardiogram    Echo (9/14): EF 46%, grade 1 diastolic dysfunction, trivial AI, trivial MR, MAC, PASP 29  . PVC (premature ventricular contraction)   . Trigger finger of both hands     Patient Active Problem List   Diagnosis Date Noted  . A-fib ([place]) 10/17/2019  . Atrial flutter ([place]) 10/17/2019  . Dupuytren contracture 12/29/2017  . Trigger finger of both hands   . Hx of echocardiogram   . Diverticulosis   . Depression   . BPH (benign prostatic hyperplasia) 09/15/2016  . Need for hepatitis C screening test 09/07/2016  . Encounter for Medicare annual wellness exam 12/11/2014  . Screening for lipoid disorders 12/03/2014  . Hoarse 07/17/2014  . Globus sensation 07/17/2014  . PVCs (premature ventricular contractions) 03/20/2014  . Postural dizziness 03/20/2014  . Palpitations 08/10/2013  . History of chest pain 08/17/2012  . Routine general medical examination at a health care facility 08/09/2012  . Prostate cancer screening 08/09/2012    Past Surgical History:  Procedure Laterality Date  . A-FLUTTER ABLATION  N/A 11/13/2019   Procedure: A-FLUTTER ABLATION;  Surgeon: Evans Lance, MD;  Location: [facility];  Service: Cardiovascular;  Laterality: N/A;  . DUPUYTREN CONTRACTURE RELEASE    . TONSILLECTOMY    . WISDOM TOOTH EXTRACTION         Home Medications    Prior to Admission medications   Medication Sig Start Date End Date Taking? Authorizing Provider  flecainide (TAMBOCOR) 100 MG tablet Take 1 tablet (100 mg total) by mouth 2 (two) times daily. May take an additional 1/2 tablet daily for breakthrough palpitations. 12/06/19  Yes Evans Lance, MD  metoprolol tartrate (LOPRESSOR) 25 MG tablet Take 1/2 tablet by mouth daily at bedtime.  May take an additional 1/2 tablet as needed for palpitations. 08/31/19  Yes Evans Lance, MD  Multiple Vitamin (MULTIVITAMIN WITH MINERALS) TABS Take 1 tablet by mouth daily.    Yes [provider]  Turmeric 500 MG CAPS Take 500 mg by mouth daily.   Yes [provider]  acetaminophen (TYLENOL) 500 MG tablet Take 1,000 mg by mouth every 6 (six) hours as needed for moderate pain or headache.    [provider]  ibuprofen (ADVIL) 200 MG tablet Take 400 mg by mouth every 6 (six) hours as needed for headache or moderate pain.    [provider]  Omega-3 Fatty Acids (FISH OIL PO) Take 690 mg by mouth daily.     [provider]  Family History Family History  Problem Relation Age of Onset  . Arthritis Mother   . Heart disease Mother        ? atrial fib  . Depression Sister   . Colon polyps Father   . Cancer Maternal Aunt        ?colon cancer  . Heart disease Maternal Grandmother        CAD  . Cancer Maternal Grandmother        liver cancer  . Heart disease Maternal Grandfather        CAD  . Colon cancer Paternal Grandmother   . Sudden death Neg Hx     Social History Social History   Tobacco Use  . Smoking status: Never Smoker  . Smokeless tobacco: Never Used  Vaping Use  . Vaping Use:  Never used  Substance Use Topics  . Alcohol use: Yes    Alcohol/week: 7.0 standard drinks    Types: 7 Glasses of wine per week    Comment: 6 oz every evening   . Drug use: No     Allergies   Patient has no known allergies.   Review of Systems Review of Systems   Physical Exam Triage Vital Signs ED Triage Vitals  Enc Vitals Group     BP 08/21/20 1958 109/66     Pulse Rate 08/21/20 1958 (!) 55     Resp 08/21/20 1958 19     Temp 08/21/20 1958 97.8 F (36.6 C)     Temp Source 08/21/20 1958 Oral     SpO2 08/21/20 1958 98 %     Weight --      Height --      Head Circumference --      Peak Flow --      Pain Score 08/21/20 1955 1     Pain Loc --      Pain Edu? --      Excl. in [place]? --    No data found.  Updated Vital Signs BP 109/66 (BP Location: Right Arm)   Pulse (!) 55   Temp 97.8 F (36.6 C) (Oral)   Resp 19   SpO2 98%   Visual Acuity Right Eye Distance:   Left Eye Distance:   Bilateral Distance:    Right Eye Near:   Left Eye Near:    Bilateral Near:     Physical Exam Constitutional:      Appearance: He is well-developed.  Cardiovascular:     Rate and Rhythm: Normal rate.  Pulmonary:     Effort: Pulmonary effort is normal.  Musculoskeletal:       Feet:  Feet:     Comments: Curved laceration with intact skin flap, small oozing of blood which is controlled with light pressure; cap refill < 2 seconds ; strong pedal pulse; approximately 1cm total in size, only approximately 1-2 mm in depth; no underlying structure injury and full ROM of foot and toes.  Skin:    General: Skin is warm and dry.  Neurological:     Mental Status: He is alert and oriented to person, place, and time.      UC Treatments / Results  Labs (all labs ordered are listed, but only abnormal results are displayed) Labs Reviewed - No data to display  EKG   Radiology No results found.  Procedures Laceration Repair  Date/Time: 08/23/2020 9:07 AM Performed by: Zigmund Gottron, NP Authorized by: Vanessa Kick, MD   Consent:    Consent  obtained:  Verbal   Consent given by:  Patient   Risks discussed:  Infection, poor cosmetic result, poor wound healing and need for additional repair   Alternatives discussed:  No treatment, referral and observation Anesthesia (see MAR for exact dosages):    Anesthesia method:  None Laceration details:    Location:  Foot   Foot location:  Top of R foot   Length (cm):  1   Depth (mm):  2 Repair type:    Repair type:  Simple Exploration:    Wound exploration: wound explored through full range of motion and entire depth of wound probed and visualized     Wound extent: no foreign bodies/material noted, no muscle damage noted, no nerve damage noted, no tendon damage noted, no underlying fracture noted and no vascular damage noted   Treatment:    Area cleansed with:  Betadine and Shur-Clens   Amount of cleaning:  Extensive Skin repair:    Repair method:  Steri-Strips   Number of Steri-Strips:  3 Approximation:    Approximation:  Close Post-procedure details:    Dressing:  Non-adherent dressing   Patient tolerance of procedure:  Tolerated well, no immediate complications   (including critical care time)  Medications Ordered in UC Medications - No data to display  Initial Impression / Assessment and Plan / UC Course  I have reviewed the triage vital signs and the nursing notes.  Pertinent labs & imaging results that were available during my care of the patient were reviewed by me and considered in my medical decision making (see chart for details).     Fairly superficial laceration which is not over a joint, well approximated. Thoroughly cleansed and steri strips placed. Wound care discussed. Return precautions provided. Patient verbalized understanding and agreeable to plan.   Final Clinical Impressions(s) / UC Diagnoses   Final diagnoses:  Laceration of right foot, initial encounter     Discharge  Instructions     Keep steri strips as long as possible in place, ideally around 5 days at least.  May get wet, but try to limit agitation to the area.  Please return for any signs of infection- redness, warmth, swelling or pus drainage   ED Prescriptions    None     PDMP not reviewed this encounter.   Zigmund Gottron, NP 08/23/20 [phone]

## 2020-08-26 HISTORY — PX: HAND SURGERY: SHX662

## 2020-09-17 DIAGNOSIS — M720 Palmar fascial fibromatosis [Dupuytren]: Secondary | ICD-10-CM | POA: Diagnosis not present

## 2020-09-17 DIAGNOSIS — G8918 Other acute postprocedural pain: Secondary | ICD-10-CM | POA: Diagnosis not present

## 2020-10-01 ENCOUNTER — Ambulatory Visit: Payer: Medicare Other | Admitting: Internal Medicine

## 2020-10-03 DIAGNOSIS — M25641 Stiffness of right hand, not elsewhere classified: Secondary | ICD-10-CM | POA: Diagnosis not present

## 2020-10-07 DIAGNOSIS — L812 Freckles: Secondary | ICD-10-CM | POA: Diagnosis not present

## 2020-10-07 DIAGNOSIS — L814 Other melanin hyperpigmentation: Secondary | ICD-10-CM | POA: Diagnosis not present

## 2020-10-07 DIAGNOSIS — D229 Melanocytic nevi, unspecified: Secondary | ICD-10-CM | POA: Diagnosis not present

## 2020-10-07 DIAGNOSIS — L819 Disorder of pigmentation, unspecified: Secondary | ICD-10-CM | POA: Diagnosis not present

## 2020-10-07 DIAGNOSIS — L821 Other seborrheic keratosis: Secondary | ICD-10-CM | POA: Diagnosis not present

## 2020-10-07 DIAGNOSIS — L570 Actinic keratosis: Secondary | ICD-10-CM | POA: Diagnosis not present

## 2020-10-07 DIAGNOSIS — L718 Other rosacea: Secondary | ICD-10-CM | POA: Diagnosis not present

## 2020-10-07 DIAGNOSIS — D1801 Hemangioma of skin and subcutaneous tissue: Secondary | ICD-10-CM | POA: Diagnosis not present

## 2020-10-08 DIAGNOSIS — M25641 Stiffness of right hand, not elsewhere classified: Secondary | ICD-10-CM | POA: Diagnosis not present

## 2020-10-10 DIAGNOSIS — M25641 Stiffness of right hand, not elsewhere classified: Secondary | ICD-10-CM | POA: Diagnosis not present

## 2020-10-15 DIAGNOSIS — M25641 Stiffness of right hand, not elsewhere classified: Secondary | ICD-10-CM | POA: Diagnosis not present

## 2020-10-24 DIAGNOSIS — M25641 Stiffness of right hand, not elsewhere classified: Secondary | ICD-10-CM | POA: Diagnosis not present

## 2020-10-29 DIAGNOSIS — Z20822 Contact with and (suspected) exposure to covid-19: Secondary | ICD-10-CM | POA: Diagnosis not present

## 2020-10-30 ENCOUNTER — Other Ambulatory Visit: Payer: Self-pay

## 2020-10-30 DIAGNOSIS — Z20822 Contact with and (suspected) exposure to covid-19: Secondary | ICD-10-CM | POA: Diagnosis not present

## 2020-11-15 ENCOUNTER — Other Ambulatory Visit: Payer: Self-pay | Admitting: Internal Medicine

## 2021-01-02 ENCOUNTER — Telehealth: Payer: Self-pay | Admitting: *Deleted

## 2021-01-02 NOTE — Telephone Encounter (Signed)
Primary Cardiologist:Gregg Lovena Le, MD  Chart reviewed as part of pre-operative protocol coverage. Because of EROS MONTOUR past medical history and time since last visit, he/she will require a follow-up visit in order to better assess preoperative cardiovascular risk.  Pre-op covering staff: - Please schedule appointment and call patient to inform them. - Please contact requesting surgeon's office via preferred method (i.e, phone, fax) to inform them of need for appointment prior to surgery.  If applicable, this message will also be routed to pharmacy pool and/or primary cardiologist for input on holding anticoagulant/antiplatelet agent as requested below so that this information is available at time of patient's appointment.   Deberah Pelton, NP  01/02/2021, 11:03 AM

## 2021-01-02 NOTE — Telephone Encounter (Signed)
Called pt and scheduled appt at [facility] office 01-10-2021

## 2021-01-02 NOTE — Telephone Encounter (Signed)
   [facility] Pre-operative Risk Assessment    HEARTCARE STAFF: - Please ensure there is not already an duplicate clearance open for this procedure. - Under Visit Info/Reason for Call, type in Other and utilize the format Clearance MM/DD/YY or Clearance TBD. Do not use dashes or single digits. - If request is for dental extraction, please clarify the # of teeth to be extracted.  Request for surgical clearance:  1. What type of surgery is being performed? LEFT MIDDLE FINGER SUBTOTAL PALMAR FASCIECTOMY INVOLVING THE PALM AND MIDDLE FINGER WITH NEUROPLASTY AS NECESSARY AND MANIPULATION  2. When is this surgery scheduled? 03/04/21   3. What type of clearance is required (medical clearance vs. Pharmacy clearance to hold med vs. Both)? MEDICAL  4. Are there any medications that need to be held prior to surgery and how long? NONE LISTED   5. Practice name and name of physician performing surgery? [facility]; DR. Gwyndolyn Saxon GRAMIG   6. What is the office phone number? [phone]   7.   What is the office fax number? [place]  8.   Anesthesia type (None, local, MAC, general) ? BLOCK WITH IV SEDATION   Julaine Hua 01/02/2021, 9:41 AM  _________________________________________________________________   (provider comments below)

## 2021-01-09 NOTE — Progress Notes (Signed)
PCP:  Abner Greenspan, MD Primary Cardiologist: Cristopher Peru, MD Electrophysiologist: Cristopher Peru, MD    Jacob Marsh is a 70 y.o. male seen today for Cristopher Peru, MD for cardiac clearance for "LEFT MIDDLE FINGER SUBTOTAL PALMAR FASCIECTOMY INVOLVING THE PALM AND MIDDLE FINGER WITH NEUROPLASTY AS NECESSARY AND MANIPULATION".  Since last being seen in our clinic the patient reports doing very well.  he denies chest pain, palpitations, dyspnea, PND, orthopnea, nausea, vomiting, dizziness, syncope, edema, weight gain, or early satiety.  Past Medical History:  Diagnosis Date  . Depression   . Diverticulosis    History of diverticulosis// flex sig 2006//colonoscopy 01/21/2000 diverticulosis  . GERD (gastroesophageal reflux disease)   . Hx of echocardiogram    Echo (9/14): EF 00%, grade 1 diastolic dysfunction, trivial AI, trivial MR, MAC, PASP 29  . PVC (premature ventricular contraction)   . Trigger finger of both hands    Past Surgical History:  Procedure Laterality Date  . A-FLUTTER ABLATION N/A 11/13/2019   Procedure: A-FLUTTER ABLATION;  Surgeon: Evans Lance, MD;  Location: [facility];  Service: Cardiovascular;  Laterality: N/A;  . DUPUYTREN CONTRACTURE RELEASE    . TONSILLECTOMY    . WISDOM TOOTH EXTRACTION      Current Outpatient Medications  Medication Sig Dispense Refill  . acetaminophen (TYLENOL) 500 MG tablet Take 1,000 mg by mouth every 6 (six) hours as needed for moderate pain or headache.    . flecainide (TAMBOCOR) 100 MG tablet TAKE 1 TABLET BY MOUTH TWICE DAILY. MAY TAKE ADDITIONAL 1/2 TABLET DAILY FOR BREAKTHROUGH PALPITATIONS 225 tablet 2  . ibuprofen (ADVIL) 200 MG tablet Take 400 mg by mouth every 6 (six) hours as needed for headache or moderate pain.    . metoprolol tartrate (LOPRESSOR) 25 MG tablet TAKE 1/2 TABLET BY MOUTH EVERY NIGHT AT BEDTIME. MAY TAKE AN ADDITIONAL 1/2 TABLET AS NEEDED FOR PALPITATION 90 tablet 2  . Multiple Vitamin  (MULTIVITAMIN WITH MINERALS) TABS Take 1 tablet by mouth daily.     . Omega-3 Fatty Acids (FISH OIL PO) Take 690 mg by mouth daily.     . Turmeric 500 MG CAPS Take 500 mg by mouth daily.     No current facility-administered medications for this visit.    No Known Allergies  Social History   Socioeconomic History  . Marital status: Married    Spouse name: Not on file  . Number of children: Not on file  . Years of education: Not on file  . Highest education level: Not on file  Occupational History  . Not on file  Tobacco Use  . Smoking status: Never Smoker  . Smokeless tobacco: Never Used  Vaping Use  . Vaping Use: Never used  Substance and Sexual Activity  . Alcohol use: Yes    Alcohol/week: 7.0 standard drinks    Types: 7 Glasses of wine per week    Comment: 6 oz every evening   . Drug use: No  . Sexual activity: Yes  Other Topics Concern  . Not on file  Social History Narrative   Wife has alzheimer's and he cares for her at home   Still working as well    Social Determinants of Health   Financial Resource Strain: Not on file  Food Insecurity: Not on file  Transportation Needs: Not on file  Physical Activity: Not on file  Stress: Not on file  Social Connections: Not on file  Intimate Partner Violence: Not on file  Review of Systems: General: No chills, fever, night sweats or weight changes  Cardiovascular:  No chest pain, dyspnea on exertion, edema, orthopnea, palpitations, paroxysmal nocturnal dyspnea Dermatological: No rash, lesions or masses Respiratory: No cough, dyspnea Urologic: No hematuria, dysuria Abdominal: No nausea, vomiting, diarrhea, bright red blood per rectum, melena, or hematemesis Neurologic: No visual changes, weakness, changes in mental status All other systems reviewed and are otherwise negative except as noted above.  Physical Exam: Vitals:   01/10/21 1023  BP: 100/60  Pulse: (!) 54  SpO2: 98%  Weight: 156 lb (70.8 kg)   Height: 6' (1.829 m)    GEN- The patient is well appearing, alert and oriented x 3 today.   HEENT: normocephalic, atraumatic; sclera clear, conjunctiva pink; hearing intact; oropharynx clear; neck supple, no JVP Lymph- no cervical lymphadenopathy Lungs- Clear to ausculation bilaterally, normal work of breathing.  No wheezes, rales, rhonchi Heart- Regular rate and rhythm, no murmurs, rubs or gallops, PMI not laterally displaced GI- soft, non-tender, non-distended, bowel sounds present, no hepatosplenomegaly Extremities- no clubbing, cyanosis, or edema; DP/PT/radial pulses 2+ bilaterally MS- no significant deformity or atrophy Skin- warm and dry, no rash or lesion Psych- euthymic mood, full affect Neuro- strength and sensation are intact  EKG is ordered. Personal review of EKG from today shows NSR (sinus brady) at 54 bpm. QRS stable from last 06/2020.  Additional studies reviewed include: Previous EP office nots  Assessment and Plan:  1. Atrial flutter Asymptomatic s/p catheter ablatoin  2. Atrial fib EKG today shows NSR at 54 bpm He is not on [place] with CHA2DS2VASC of 1. He does not have HTN.   Would resume Xarelto with any increase in RFs.  3. PVCs Asymptomatic on low dose BB and flecainide.  QRS stable 124-126 range compared to previous EKG shows NSR as above without ectopy.  4. Cardiac clearance for "LEFT MIDDLE FINGER SUBTOTAL PALMAR FASCIECTOMY INVOLVING THE PALM AND MIDDLE FINGER WITH NEUROPLASTY AS NECESSARY AND MANIPULATION" Normal exercise tolerance test 02/2016. No symptoms concerning of heart failure or CAD.  Pt is clear to proceed with procedure above without further work up with low risk of perioperative complications from a cardiac perspective by Revised Cardiac Risk Index [place] Criteria)  Shirley Friar, PA-C  01/10/21 10:31 AM

## 2021-01-10 ENCOUNTER — Ambulatory Visit (INDEPENDENT_AMBULATORY_CARE_PROVIDER_SITE_OTHER): Payer: Medicare Other | Admitting: Student

## 2021-01-10 ENCOUNTER — Other Ambulatory Visit: Payer: Self-pay

## 2021-01-10 ENCOUNTER — Encounter: Payer: Self-pay | Admitting: Student

## 2021-01-10 VITALS — BP 100/60 | HR 54 | Ht 72.0 in | Wt 156.0 lb

## 2021-01-10 DIAGNOSIS — I4891 Unspecified atrial fibrillation: Secondary | ICD-10-CM

## 2021-01-10 DIAGNOSIS — I493 Ventricular premature depolarization: Secondary | ICD-10-CM

## 2021-01-10 DIAGNOSIS — I4892 Unspecified atrial flutter: Secondary | ICD-10-CM

## 2021-01-10 NOTE — Patient Instructions (Signed)
Medication Instructions:  Your physician recommends that you continue on your current medications as directed. Please refer to the Current Medication list given to you today.  *If you need a refill on your cardiac medications before your next appointment, please call your pharmacy*   Lab Work: NOne If you have labs (blood work) drawn today and your tests are completely normal, you will receive your results only by: Marland Kitchen MyChart Message (if you have MyChart) OR . A paper copy in the mail If you have any lab test that is abnormal or we need to change your treatment, we will call you to review the results.  Follow-Up: At [facility], you and your health needs are our priority.  As part of our continuing mission to provide you with exceptional heart care, we have created designated Provider Care Teams.  These Care Teams include your primary Cardiologist (physician) and Advanced Practice Providers (APPs -  Physician Assistants and Nurse Practitioners) who all work together to provide you with the care you need, when you need it.  Your next appointment:   6 month(s)  The format for your next appointment:   In Person  Provider:   You may see Cristopher Peru, MD or one of the following Advanced Practice Providers on your designated Care Team:    Chanetta Marshall, NP  Tommye Standard, PA-C  Legrand Como "Joslin" [place], [place]

## 2021-01-12 ENCOUNTER — Telehealth: Payer: Self-pay | Admitting: Family Medicine

## 2021-01-12 DIAGNOSIS — Z125 Encounter for screening for malignant neoplasm of prostate: Secondary | ICD-10-CM

## 2021-01-12 DIAGNOSIS — I4891 Unspecified atrial fibrillation: Secondary | ICD-10-CM

## 2021-01-12 DIAGNOSIS — N401 Enlarged prostate with lower urinary tract symptoms: Secondary | ICD-10-CM

## 2021-01-12 DIAGNOSIS — Z13220 Encounter for screening for lipoid disorders: Secondary | ICD-10-CM

## 2021-01-12 DIAGNOSIS — R350 Frequency of micturition: Secondary | ICD-10-CM

## 2021-01-12 DIAGNOSIS — I493 Ventricular premature depolarization: Secondary | ICD-10-CM

## 2021-01-12 NOTE — Telephone Encounter (Signed)
-----   Message from Cloyd Stagers, RT sent at 12/30/2020  2:07 PM EST ----- Regarding: Lab Orders for Monday 3.21.2022 Please place lab orders for Monday 3.21.2022, office visit for physical on Friday 3.25.2022 Thank you, Dyke Maes RT(R)

## 2021-01-13 ENCOUNTER — Other Ambulatory Visit: Payer: Medicare Other

## 2021-01-13 ENCOUNTER — Ambulatory Visit: Payer: Medicare Other

## 2021-01-14 ENCOUNTER — Ambulatory Visit: Payer: Medicare Other

## 2021-01-15 ENCOUNTER — Other Ambulatory Visit: Payer: Self-pay

## 2021-01-15 ENCOUNTER — Ambulatory Visit (INDEPENDENT_AMBULATORY_CARE_PROVIDER_SITE_OTHER): Payer: Medicare Other

## 2021-01-15 DIAGNOSIS — Z0000 Encounter for general adult medical examination without abnormal findings: Secondary | ICD-10-CM | POA: Diagnosis not present

## 2021-01-15 NOTE — Progress Notes (Signed)
Subjective:   Jacob Marsh is a 70 y.o. male who presents for Medicare Annual/Subsequent preventive examination.  Review of Systems: N/A      I connected with the patient today by telephone and verified that I am speaking with the correct person using two identifiers. Location patient: home Location nurse: work Persons participating in the telephone visit: patient, nurse.   I discussed the limitations, risks, security and privacy concerns of performing an evaluation and management service by telephone and the availability of in person appointments. I also discussed with the patient that there may be a patient responsible charge related to this service. The patient expressed understanding and verbally consented to this telephonic visit.        Cardiac Risk Factors include: advanced age (>2mn, >>12women);male gender     Objective:    Today's Vitals   There is no height or weight on file to calculate BMI.  Advanced Directives 01/15/2021 01/02/2020 11/13/2019 09/14/2017 09/10/2016 08/21/2015  Does Patient Have a Medical Advance Directive? _0  Yes  Type of AParamedicof AForest HillsLiving will HC[place]Living will HLake AnnLiving will HGast[place]Living will HHerringsLiving will [place] PointLiving will  Does patient want to make changes to medical advance directive? - - No - Patient declined - No - Patient declined No - Patient declined  Copy of [place]in Chart? No - copy requested No - copy requested No - copy requested No - copy requested No - copy requested No - copy requested    Current Medications (verified) Outpatient Encounter Medications as of 01/15/2021  Medication Sig  . acetaminophen (TYLENOL) 500 MG tablet Take 1,000 mg by mouth every 6 (six) hours as needed for moderate pain or headache.  . flecainide (TAMBOCOR) 100 MG  tablet TAKE 1 TABLET BY MOUTH TWICE DAILY. MAY TAKE ADDITIONAL 1/2 TABLET DAILY FOR BREAKTHROUGH PALPITATIONS  . ibuprofen (ADVIL) 200 MG tablet Take 400 mg by mouth every 6 (six) hours as needed for headache or moderate pain.  . metoprolol tartrate (LOPRESSOR) 25 MG tablet TAKE 1/2 TABLET BY MOUTH EVERY NIGHT AT BEDTIME. MAY TAKE AN ADDITIONAL 1/2 TABLET AS NEEDED FOR PALPITATION  . Multiple Vitamin (MULTIVITAMIN WITH MINERALS) TABS Take 1 tablet by mouth daily.   . Omega-3 Fatty Acids (FISH OIL PO) Take 690 mg by mouth daily.   . Turmeric 500 MG CAPS Take 500 mg by mouth daily.   No facility-administered encounter medications on file as of 01/15/2021.    Allergies (verified) Patient has no known allergies.   History: Past Medical History:  Diagnosis Date  . Depression   . Diverticulosis    History of diverticulosis// flex sig 2006//colonoscopy 01/21/2000 diverticulosis  . GERD (gastroesophageal reflux disease)   . Hx of echocardiogram    Echo (9/14): EF 521% grade 1 diastolic dysfunction, trivial AI, trivial MR, MAC, PASP 29  . PVC (premature ventricular contraction)   . Trigger finger of both hands    Past Surgical History:  Procedure Laterality Date  . A-FLUTTER ABLATION N/A 11/13/2019   Procedure: A-FLUTTER ABLATION;  Surgeon: TEvans Lance MD;  [facility];  Service: Cardiovascular;  Laterality: N/A;  . DUPUYTREN CONTRACTURE RELEASE    . HAND SURGERY Right 08/2020  . TONSILLECTOMY    . WISDOM TOOTH EXTRACTION     Family History  Problem Relation Age of Onset  . Arthritis  Mother   . Heart disease Mother        ? atrial fib  . Depression Sister   . Colon polyps Father   . Cancer Maternal Aunt        ?colon cancer  . Heart disease Maternal Grandmother        CAD  . Cancer Maternal Grandmother        liver cancer  . Heart disease Maternal Grandfather        CAD  . Colon cancer Paternal Grandmother   . Sudden death Neg Hx    Social History    Socioeconomic History  . Marital status: Married    Spouse name: Not on file  . Number of children: Not on file  . Years of education: Not on file  . Highest education level: Not on file  Occupational History  . Not on file  Tobacco Use  . Smoking status: Never Smoker  . Smokeless tobacco: Never Used  Vaping Use  . Vaping Use: Never used  Substance and Sexual Activity  . Alcohol use: Yes    Alcohol/week: 7.0 standard drinks    Types: 7 Glasses of wine per week    Comment: 2 oz every evening   . Drug use: No  . Sexual activity: Yes  Other Topics Concern  . Not on file  Social History Narrative   Wife has alzheimer's and he cares for her at home   Still working as well    Social Determinants of Health   Financial Resource Strain: Low Risk   . Difficulty of Paying Living Expenses: Not hard at all  Food Insecurity: No Food Insecurity  . Worried About Charity fundraiser in the Last Year: Never true  . Ran Out of Food in the Last Year: Never true  Transportation Needs: No Transportation Needs  . Lack of Transportation (Medical): No  . Lack of Transportation (Non-Medical): No  Physical Activity: Sufficiently Active  . Days of Exercise per Week: 7 days  . Minutes of Exercise per Session: 60 min  Stress: No Stress Concern Present  . Feeling of Stress : Not at all  Social Connections: Not on file    Tobacco Counseling Counseling given: Not Answered   Clinical Intake:  Pre-visit preparation completed: Yes  Pain : No/denies pain     Nutritional Risks: None Diabetes: No  How often do you need to have someone help you when you read instructions, pamphlets, or other written materials from your doctor or pharmacy?: 1 - Never  Diabetic: N/A Nutrition Risk Assessment:  Has the patient had any N/V/D within the last 2 months?  No  Does the patient have any non-healing wounds?  No  Has the patient had any unintentional weight loss or weight gain?  No    Diabetes:  Is the patient diabetic?  No  If diabetic, was a CBG obtained today?  N/A Did the patient bring in their glucometer from home?  N/A How often do you monitor your CBG's? N/A.   Financial Strains and Diabetes Management:  Are you having any financial strains with the device, your supplies or your medication? N/A.  Does the patient want to be seen by Chronic Care Management for management of their diabetes?  N/A Would the patient like to be referred to a Nutritionist or for Diabetic Management?  N/A     Interpreter Needed?: No  Information entered by :: CJohnson, LPN   Activities of Daily Living In your present  state of health, do you have any difficulty performing the following activities: 01/15/2021  Hearing? N  Vision? N  Difficulty concentrating or making decisions? N  Walking or climbing stairs? N  Dressing or bathing? N  Doing errands, shopping? N  Preparing Food and eating ? N  Using the Toilet? N  In the past six months, have you accidently leaked urine? N  Do you have problems with loss of bowel control? N  Managing your Medications? N  Managing your Finances? N  Housekeeping or managing your Housekeeping? N  Some recent data might be hidden    Patient Care Team: Tower, Wynelle Fanny, MD as PCP - General Evans Lance, MD as PCP - Cardiology (Cardiology) Evans Lance, MD as PCP - Electrophysiology (Cardiology) Josue Hector, MD as Consulting Physician (Cardiology) Evans Lance, MD as Consulting Physician (Cardiology) Jola Schmidt, MD as Consulting Physician (Ophthalmology) Druscilla Brownie, MD as Consulting Physician (Dermatology) Vinnie Level, DDS, PA as Referring Physician (Dentistry)  Indicate any recent Medical Services you may have received from other than [facility] providers in the past year (date may be approximate).     Assessment:   This is a routine wellness examination for Pharell.  Hearing/Vision screen  Hearing Screening    _0  _1  _2  _3  _4  _5  _6  _7  _8   Right ear:           Left ear:           Vision Screening Comments: Patient gets annual eye exams   Dietary issues and exercise activities discussed: Current Exercise Habits: Home exercise routine, Type of exercise: walking, Time (Minutes): 60, Frequency (Times/Week): 7, Weekly Exercise (Minutes/Week): 420, Intensity: Moderate, Exercise limited by: None identified  Goals    . Increase physical activity     Starting 09/14/2016, I will attempt to exercise at least 30 min 3 days per week.     . Increase physical activity     Starting 09/14/2017, I will continue to do stretching for at least 30 min 5 days per week and to do weights for 20 min 3 days per week.     . Patient Stated     01/02/2020, I will continue to walk 2-3 miles everyday.     . Patient Stated     01/15/2021, I will continue to walk everyday for about 2-3 miles.       Depression Screen PHQ 2/9 Scores 01/15/2021 01/02/2020 01/03/2019 09/14/2017 09/10/2016 12/11/2014  PHQ - 2 Score 0 0 0 0 0 0  PHQ- 9 Score 0 0 - 2 - -    Fall Risk Fall Risk  01/15/2021 01/02/2020 01/03/2019 09/14/2017 09/10/2016  Falls in the past year? 0 1 1 No No  Comment - slipped on ice - - -  Number falls in past yr: 0 0 0 - -  Injury with Fall? 0 0 0 - -  Risk for fall due to : Medication side effect Medication side effect - - -  Follow up Falls evaluation completed;Falls prevention discussed Falls evaluation completed;Falls prevention discussed - - -    FALL RISK PREVENTION PERTAINING TO THE HOME:  Any stairs in or around the home? Yes  If so, are there any without handrails? No  Home free of loose throw rugs in walkways, pet beds, electrical cords, etc? Yes  Adequate lighting in your home to reduce risk of falls? Yes   ASSISTIVE DEVICES UTILIZED TO PREVENT FALLS:  Life alert? No  Use of a cane,  walker or w/c? No  Grab bars in the bathroom? No  Shower chair or bench in shower? No   Elevated toilet seat or a handicapped toilet? No   TIMED UP AND GO:  Was the test performed? N/A telephone visit .   Cognitive Function: MMSE - Mini Mental State Exam 01/15/2021 01/02/2020 09/14/2017 09/10/2016  Orientation to time _0 Orientation to Place _1 Registration _2 Attention/ Calculation 5 5 0 0  Recall _3 Language- name 2 objects - - 0 0  Language- repeat _4 Language- follow 3 step command - - 3 3  Language- read & follow direction - - 0 0  Write a sentence - - 0 0  Copy design - - 0 0  Total score - - 20 20  Mini Cog  Mini-Cog screen was completed. Maximum score is 22. A value of 0 denotes this part of the MMSE was not completed or the patient failed this part of the Mini-Cog screening.       Immunizations Immunization History  Administered Date(s) Administered  . Fluad Quad(high Dose 65+) 08/08/2019  . Influenza Split 08/17/2012  . Influenza, High Dose Seasonal PF 09/10/2018  . Influenza,inj,Quad PF,6+ Mos 12/11/2014, 09/10/2016, 09/14/2017  . Moderna Sars-Covid-2 Vaccination 11/28/2019, 12/26/2019, 08/23/2020  . Pneumococcal Conjugate-13 09/10/2016  . Pneumococcal Polysaccharide-23 12/11/2014  . Td 09/26/1999, 03/19/2009    TDAP status: Due, Education has been provided regarding the importance of this vaccine. Advised may receive this vaccine at local pharmacy or Health Dept. Aware to provide a copy of the vaccination record if obtained from local pharmacy or Health Dept. Verbalized acceptance and understanding.  Flu Vaccine status: Up to date  Pneumococcal vaccine status: Up to date  Covid-19 vaccine status: Completed vaccines  Qualifies for Shingles Vaccine? Yes   Zostavax completed No   Shingrix Completed?: No.    Education has been provided regarding the importance of this vaccine. Patient has been advised to call insurance company to determine out of pocket expense if they have not yet received this vaccine. Advised may  also receive vaccine at local pharmacy or Health Dept. Verbalized acceptance and understanding.  Screening Tests Health Maintenance  Topic Date Due  . TETANUS/TDAP  01/02/2024 (Originally 03/20/2019)  . COLONOSCOPY (Pts 45-73yr Insurance coverage will need to be confirmed)  09/03/2025  . INFLUENZA VACCINE  Completed  . COVID-19 Vaccine  Completed  . Hepatitis C Screening  Completed  . PNA vac Low Risk Adult  Completed  . HPV VACCINES  Aged Out    Health Maintenance  There are no preventive care reminders to display for this patient.  Colorectal cancer screening: Type of screening: Colonoscopy. Completed 09/04/2015. Repeat every 10 years  Lung Cancer Screening: (Low Dose CT Chest recommended if Age 72-80years, 30 pack-year currently smoking OR have quit w/in 15 years.) does not qualify.    Additional Screening:  Hepatitis C Screening: does qualify; Completed 09/10/2016  Vision Screening: Recommended annual ophthalmology exams for early detection of glaucoma and other disorders of the eye. Is the patient up to date with their annual eye exam?  Yes  Who is the provider or what is the name of the office in which the patient attends annual eye exams? Dr. BJola Schmidt If pt is not established with a provider, would they like to be referred to a provider to establish care? No .  Dental Screening: Recommended annual dental exams for proper oral hygiene  Community Resource Referral / Chronic Care Management: CRR required this visit?  No   CCM required this visit?  No      Plan:     I have personally reviewed and noted the following in the patient's chart:   . Medical and social history . Use of alcohol, tobacco or illicit drugs  . Current medications and supplements . Functional ability and status . Nutritional status . Physical activity . Advanced directives . List of other physicians . Hospitalizations, surgeries, and ER visits in previous 12  months . Vitals . Screenings to include cognitive, depression, and falls . Referrals and appointments  In addition, I have reviewed and discussed with patient certain preventive protocols, quality metrics, and best practice recommendations. A written personalized care plan for preventive services as well as general preventive health recommendations were provided to patient.   Due to this being a telephonic visit, the after visit summary with patients personalized plan was offered to patient via office or my-chart. Patient preferred to pick up at office at next visit or via mychart.   Andrez Grime, LPN   [id number]

## 2021-01-15 NOTE — Progress Notes (Signed)
PCP notes:  Health Maintenance: No gaps noted   Abnormal Screenings: none   Patient concerns: Wants to discuss frequency of colonoscopy due to family history of colon cancer   Nurse concerns: none   Next PCP appt.: 02/18/2021 @ 10:30 am

## 2021-01-15 NOTE — Patient Instructions (Signed)
[name] , Thank you for taking time to come for your Medicare Wellness Visit. I appreciate your ongoing commitment to your health goals. Please review the following plan we discussed and let me know if I can assist you in the future.   Screening recommendations/referrals: Colonoscopy: Up to date, completed 09/04/2015, due 08/2025 Recommended yearly ophthalmology/optometry visit for glaucoma screening and checkup Recommended yearly dental visit for hygiene and checkup  Vaccinations: Influenza vaccine: Up to date, completed 08/16/2020, due 05/2021 Pneumococcal vaccine: Completed series Tdap vaccine: decline-insurance Shingles vaccine: due, check with your insurance regarding coverage if interested    Covid-19: Completed series  Advanced directives: Please bring a copy of your POA (Power of Attorney) and/or Living Will to your next appointment.   Conditions/risks identified: none  Next appointment: Follow up in one year for your annual wellness visit.   Preventive Care 50 Years and Older, Male Preventive care refers to lifestyle choices and visits with your health care provider that can promote health and wellness. What does preventive care include?  A yearly physical exam. This is also called an annual well check.  Dental exams once or twice a year.  Routine eye exams. Ask your health care provider how often you should have your eyes checked.  Personal lifestyle choices, including:  Daily care of your teeth and gums.  Regular physical activity.  Eating a healthy diet.  Avoiding tobacco and drug use.  Limiting alcohol use.  Practicing safe sex.  Taking low doses of aspirin every day.  Taking vitamin and mineral supplements as recommended by your health care provider. What happens during an annual well check? The services and screenings done by your health care provider during your annual well check will depend on your age, overall health, lifestyle risk factors, and  family history of disease. Counseling  Your health care provider may ask you questions about your:  Alcohol use.  Tobacco use.  Drug use.  Emotional well-being.  Home and relationship well-being.  Sexual activity.  Eating habits.  History of falls.  Memory and ability to understand (cognition).  Work and work Statistician. Screening  You may have the following tests or measurements:  Height, weight, and BMI.  Blood pressure.  Lipid and cholesterol levels. These may be checked every 5 years, or more frequently if you are over 54 years old.  Skin check.  Lung cancer screening. You may have this screening every year starting at age 65 if you have a 30-pack-year history of smoking and currently smoke or have quit within the past 15 years.  Fecal occult blood test (FOBT) of the stool. You may have this test every year starting at age 60.  Flexible sigmoidoscopy or colonoscopy. You may have a sigmoidoscopy every 5 years or a colonoscopy every 10 years starting at age 37.  Prostate cancer screening. Recommendations will vary depending on your family history and other risks.  Hepatitis C blood test.  Hepatitis B blood test.  Sexually transmitted disease (STD) testing.  Diabetes screening. This is done by checking your blood sugar (glucose) after you have not eaten for a while (fasting). You may have this done every 1-3 years.  Abdominal aortic aneurysm (AAA) screening. You may need this if you are a current or former smoker.  Osteoporosis. You may be screened starting at age 43 if you are at high risk. Talk with your health care provider about your test results, treatment options, and if necessary, the need for more tests. Vaccines  Your health  care provider may recommend certain vaccines, such as:  Influenza vaccine. This is recommended every year.  Tetanus, diphtheria, and acellular pertussis (Tdap, Td) vaccine. You may need a Td booster every 10 years.  Zoster  vaccine. You may need this after age 41.  Pneumococcal 13-valent conjugate (PCV13) vaccine. One dose is recommended after age 39.  Pneumococcal polysaccharide (PPSV23) vaccine. One dose is recommended after age 40. Talk to your health care provider about which screenings and vaccines you need and how often you need them. This information is not intended to replace advice given to you by your health care provider. Make sure you discuss any questions you have with your health care provider. Document Released: 11/08/2015 Document Revised: 07/01/2016 Document Reviewed: 08/13/2015 Elsevier Interactive Patient Education  2017 [place] Prevention in the Home Falls can cause injuries. They can happen to people of all ages. There are many things you can do to make your home safe and to help prevent falls. What can I do on the outside of my home?  Regularly fix the edges of walkways and driveways and fix any cracks.  Remove anything that might make you trip as you walk through a door, such as a raised step or threshold.  Trim any bushes or trees on the path to your home.  Use bright outdoor lighting.  Clear any walking paths of anything that might make someone trip, such as rocks or tools.  Regularly check to see if handrails are loose or broken. Make sure that both sides of any steps have handrails.  Any raised decks and porches should have guardrails on the edges.  Have any leaves, snow, or ice cleared regularly.  Use sand or salt on walking paths during winter.  Clean up any spills in your garage right away. This includes oil or grease spills. What can I do in the bathroom?  Use night lights.  Install grab bars by the toilet and in the tub and shower. Do not use towel bars as grab bars.  Use non-skid mats or decals in the tub or shower.  If you need to sit down in the shower, use a plastic, non-slip stool.  Keep the floor dry. Clean up any water that spills on the  floor as soon as it happens.  Remove soap buildup in the tub or shower regularly.  Attach bath mats securely with double-sided non-slip rug tape.  Do not have throw rugs and other things on the floor that can make you trip. What can I do in the bedroom?  Use night lights.  Make sure that you have a light by your bed that is easy to reach.  Do not use any sheets or blankets that are too big for your bed. They should not hang down onto the floor.  Have a firm chair that has side arms. You can use this for support while you get dressed.  Do not have throw rugs and other things on the floor that can make you trip. What can I do in the kitchen?  Clean up any spills right away.  Avoid walking on wet floors.  Keep items that you use a lot in easy-to-reach places.  If you need to reach something above you, use a strong step stool that has a grab bar.  Keep electrical cords out of the way.  Do not use floor polish or wax that makes floors slippery. If you must use wax, use non-skid floor wax.  Do not have  throw rugs and other things on the floor that can make you trip. What can I do with my stairs?  Do not leave any items on the stairs.  Make sure that there are handrails on both sides of the stairs and use them. Fix handrails that are broken or loose. Make sure that handrails are as long as the stairways.  Check any carpeting to make sure that it is firmly attached to the stairs. Fix any carpet that is loose or worn.  Avoid having throw rugs at the top or bottom of the stairs. If you do have throw rugs, attach them to the floor with carpet tape.  Make sure that you have a light switch at the top of the stairs and the bottom of the stairs. If you do not have them, ask someone to add them for you. What else can I do to help prevent falls?  Wear shoes that:  Do not have high heels.  Have rubber bottoms.  Are comfortable and fit you well.  Are closed at the toe. Do not wear  sandals.  If you use a stepladder:  Make sure that it is fully opened. Do not climb a closed stepladder.  Make sure that both sides of the stepladder are locked into place.  Ask someone to hold it for you, if possible.  Clearly mark and make sure that you can see:  Any grab bars or handrails.  First and last steps.  Where the edge of each step is.  Use tools that help you move around (mobility aids) if they are needed. These include:  Canes.  Walkers.  Scooters.  Crutches.  Turn on the lights when you go into a dark area. Replace any light bulbs as soon as they burn out.  Set up your furniture so you have a clear path. Avoid moving your furniture around.  If any of your floors are uneven, fix them.  If there are any pets around you, be aware of where they are.  Review your medicines with your doctor. Some medicines can make you feel dizzy. This can increase your chance of falling. Ask your doctor what other things that you can do to help prevent falls. This information is not intended to replace advice given to you by your health care provider. Make sure you discuss any questions you have with your health care provider. Document Released: 08/08/2009 Document Revised: 03/19/2016 Document Reviewed: 11/16/2014 Elsevier Interactive Patient Education  2017 [employer].

## 2021-01-17 ENCOUNTER — Encounter: Payer: Medicare Other | Admitting: Family Medicine

## 2021-02-12 ENCOUNTER — Other Ambulatory Visit (INDEPENDENT_AMBULATORY_CARE_PROVIDER_SITE_OTHER): Payer: Medicare Other

## 2021-02-12 ENCOUNTER — Other Ambulatory Visit: Payer: Self-pay

## 2021-02-12 DIAGNOSIS — R350 Frequency of micturition: Secondary | ICD-10-CM

## 2021-02-12 DIAGNOSIS — N401 Enlarged prostate with lower urinary tract symptoms: Secondary | ICD-10-CM

## 2021-02-12 DIAGNOSIS — Z13220 Encounter for screening for lipoid disorders: Secondary | ICD-10-CM | POA: Diagnosis not present

## 2021-02-12 DIAGNOSIS — Z125 Encounter for screening for malignant neoplasm of prostate: Secondary | ICD-10-CM | POA: Diagnosis not present

## 2021-02-12 DIAGNOSIS — I4891 Unspecified atrial fibrillation: Secondary | ICD-10-CM | POA: Diagnosis not present

## 2021-02-12 DIAGNOSIS — I493 Ventricular premature depolarization: Secondary | ICD-10-CM | POA: Diagnosis not present

## 2021-02-12 LAB — COMPREHENSIVE METABOLIC PANEL
ALT: 14 U/L (ref 0–53)
AST: 17 U/L (ref 0–37)
Albumin: 4 g/dL (ref 3.5–5.2)
Alkaline Phosphatase: 47 U/L (ref 39–117)
BUN: 20 mg/dL (ref 6–23)
CO2: 29 mEq/L (ref 19–32)
Calcium: 9.3 mg/dL (ref 8.4–10.5)
Chloride: 102 mEq/L (ref 96–112)
Creatinine, Ser: 0.78 mg/dL (ref 0.40–1.50)
GFR: 89.76 mL/min (ref >60.00)
Glucose, Bld: 85 mg/dL (ref 70–99)
Potassium: 4 mEq/L (ref 3.5–5.1)
Sodium: 138 mEq/L (ref 135–145)
Total Bilirubin: 1.1 mg/dL (ref 0.2–1.2)
Total Protein: 6.3 g/dL (ref 6.0–8.3)

## 2021-02-12 LAB — LIPID PANEL
Cholesterol: 143 mg/dL (ref 0–200)
HDL: 56.5 mg/dL (ref >39.00)
LDL Cholesterol: 74 mg/dL (ref 0–99)
NonHDL: 86.25
Total CHOL/HDL Ratio: 3
Triglycerides: 59 mg/dL (ref 0.0–149.0)
VLDL: 11.8 mg/dL (ref 0.0–40.0)

## 2021-02-12 LAB — CBC WITH DIFFERENTIAL/PLATELET
Basophils Absolute: 0 10*3/uL (ref 0.0–0.1)
Basophils Relative: 0.5 % (ref 0.0–3.0)
Eosinophils Absolute: 0 10*3/uL (ref 0.0–0.7)
Eosinophils Relative: 0.5 % (ref 0.0–5.0)
HCT: 43.9 % (ref 39.0–52.0)
Hemoglobin: 14.8 g/dL (ref 13.0–17.0)
Lymphocytes Relative: 16.6 % (ref 12.0–46.0)
Lymphs Abs: 1.1 10*3/uL (ref 0.7–4.0)
MCHC: 33.8 g/dL (ref 30.0–36.0)
MCV: 96.1 fl (ref 78.0–100.0)
Monocytes Absolute: 0.7 10*3/uL (ref 0.1–1.0)
Monocytes Relative: 10.5 % (ref 3.0–12.0)
Neutro Abs: 5 10*3/uL (ref 1.4–7.7)
Neutrophils Relative %: 71.9 % (ref 43.0–77.0)
Platelets: 135 10*3/uL — ABNORMAL LOW (ref 150.0–400.0)
RBC: 4.56 Mil/uL (ref 4.22–5.81)
RDW: 13 % (ref 11.5–15.5)
WBC: 6.9 10*3/uL (ref 4.0–10.5)

## 2021-02-12 LAB — TSH: TSH: 4.52 u[IU]/mL — ABNORMAL HIGH (ref 0.35–4.50)

## 2021-02-12 LAB — PSA, MEDICARE: PSA: 1.91 ng/ml (ref 0.10–4.00)

## 2021-02-14 ENCOUNTER — Encounter: Payer: Self-pay | Admitting: Internal Medicine

## 2021-02-14 ENCOUNTER — Other Ambulatory Visit: Payer: Self-pay

## 2021-02-14 ENCOUNTER — Ambulatory Visit (INDEPENDENT_AMBULATORY_CARE_PROVIDER_SITE_OTHER): Payer: Medicare Other | Admitting: Internal Medicine

## 2021-02-14 VITALS — BP 124/62 | HR 59 | Ht 72.0 in | Wt 155.4 lb

## 2021-02-14 DIAGNOSIS — I493 Ventricular premature depolarization: Secondary | ICD-10-CM

## 2021-02-14 DIAGNOSIS — I4892 Unspecified atrial flutter: Secondary | ICD-10-CM

## 2021-02-14 DIAGNOSIS — I4891 Unspecified atrial fibrillation: Secondary | ICD-10-CM | POA: Diagnosis not present

## 2021-02-14 NOTE — Progress Notes (Signed)
HPI Jacob Marsh returns today to discussed his arrhythmias as well as to discuss concerned that he has about developed dementia. He has a h/o PVC's and atrial fib and flutter. His atrial arrhythmias have been controlled with flecainide which has done a nice job with the atrial fib and catheter ablation atrial flutter which developed despite flecainide. He has done well from this in the interim. He has rare PVC"s/palpitations. He notes that he is in the process of trying to prevent the development of dementia with an extensive program developed by a holistic MD in [place]. He has recommended a coronary CT scan for calcium. He has borderline TSH. He denies angina. He is moderate risk for CAD. He does not have any active demential symptoms.  No Known Allergies   Current Outpatient Medications  Medication Sig Dispense Refill  . acetaminophen (TYLENOL) 500 MG tablet Take 1,000 mg by mouth every 6 (six) hours as needed for moderate pain or headache.    . flecainide (TAMBOCOR) 100 MG tablet TAKE 1 TABLET BY MOUTH TWICE DAILY. MAY TAKE ADDITIONAL 1/2 TABLET DAILY FOR BREAKTHROUGH PALPITATIONS 225 tablet 2  . ibuprofen (ADVIL) 200 MG tablet Take 400 mg by mouth every 6 (six) hours as needed for headache or moderate pain.    . metoprolol tartrate (LOPRESSOR) 25 MG tablet TAKE 1/2 TABLET BY MOUTH EVERY NIGHT AT BEDTIME. MAY TAKE AN ADDITIONAL 1/2 TABLET AS NEEDED FOR PALPITATION 90 tablet 2  . Multiple Vitamin (MULTIVITAMIN WITH MINERALS) TABS Take 1 tablet by mouth daily.     . Omega-3 Fatty Acids (FISH OIL PO) Take 690 mg by mouth daily.     . Turmeric 500 MG CAPS Take 500 mg by mouth daily.     No current facility-administered medications for this visit.     Past Medical History:  Diagnosis Date  . A-fib ([place]) 10/17/2019  . BPH (benign prostatic hyperplasia) 09/15/2016   Mild symptoms    . Depression   . Diverticulosis    History of diverticulosis// flex sig 2006//colonoscopy 01/21/2000  diverticulosis  . Dupuytren contracture 12/29/2017  . GERD (gastroesophageal reflux disease)   . Globus sensation 07/17/2014  . Hx of echocardiogram    Echo (9/14): EF 00%, grade 1 diastolic dysfunction, trivial AI, trivial MR, MAC, PASP 29  . Palpitations 08/10/2013  . Postural dizziness 03/20/2014  . PVC (premature ventricular contraction)   . PVCs (premature ventricular contractions) 03/20/2014  . Trigger finger of both hands     ROS:   All systems reviewed and negative except as noted in the HPI.   Past Surgical History:  Procedure Laterality Date  . A-FLUTTER ABLATION N/A 11/13/2019   Procedure: A-FLUTTER ABLATION;  Surgeon: Evans Lance, MD;  Location: [facility];  Service: Cardiovascular;  Laterality: N/A;  . DUPUYTREN CONTRACTURE RELEASE    . HAND SURGERY Right 08/2020  . TONSILLECTOMY    . WISDOM TOOTH EXTRACTION       Family History  Problem Relation Age of Onset  . Arthritis Mother   . Heart disease Mother        ? atrial fib  . Depression Sister   . Colon polyps Father   . Cancer Maternal Aunt        ?colon cancer  . Heart disease Maternal Grandmother        CAD  . Cancer Maternal Grandmother        liver cancer  . Heart disease Maternal Grandfather  CAD  . Colon cancer Paternal Grandmother   . Sudden death Neg Hx      Social History   Socioeconomic History  . Marital status: Married    Spouse name: Not on file  . Number of children: Not on file  . Years of education: Not on file  . Highest education level: Not on file  Occupational History  . Not on file  Tobacco Use  . Smoking status: Never Smoker  . Smokeless tobacco: Never Used  Vaping Use  . Vaping Use: Never used  Substance and Sexual Activity  . Alcohol use: Yes    Alcohol/week: 7.0 standard drinks    Types: 7 Glasses of wine per week    Comment: 2 oz every evening   . Drug use: No  . Sexual activity: Yes  Other Topics Concern  . Not on file  Social History  Narrative   Wife has alzheimer's and he cares for her at home   Still working as well    Social Determinants of Health   Financial Resource Strain: Low Risk   . Difficulty of Paying Living Expenses: Not hard at all  Food Insecurity: No Food Insecurity  . Worried About Charity fundraiser in the Last Year: Never true  . Ran Out of Food in the Last Year: Never true  Transportation Needs: No Transportation Needs  . Lack of Transportation (Medical): No  . Lack of Transportation (Non-Medical): No  Physical Activity: Sufficiently Active  . Days of Exercise per Week: 7 days  . Minutes of Exercise per Session: 60 min  Stress: No Stress Concern Present  . Feeling of Stress : Not at all  Social Connections: Not on file  Intimate Partner Violence: Not At Risk  . Fear of Current or Ex-Partner: No  . Emotionally Abused: No  . Physically Abused: No  . Sexually Abused: No     BP 124/62   Pulse (!) 59   Ht 6' (1.829 m)   Wt 155 lb 6.4 oz (70.5 kg)   SpO2 98%   BMI 21.08 kg/m   Physical Exam:  Well appearing NAD HEENT: Unremarkable Neck:  No JVD, no thyromegally Lymphatics:  No adenopathy Back:  No CVA tenderness Lungs:  Clear HEART:  Regular rate rhythm, no murmurs, no rubs, no clicks Abd:  soft, positive bowel sounds, no organomegally, no rebound, no guarding Ext:  2 plus pulses, no edema, no cyanosis, no clubbing Skin:  No rashes no nodules Neuro:  CN II through XII intact, motor grossly intact  EKG nsr  Assess/Plan: 1. Atrial fib - he is well controlled with flecainide. 2. Atrial flutter - he is s/p catheter ablation. No symptoms.  3. PVC' s - he is minimally symptomatic. We will follow. 4. Dementia screening - he has asked me to schedule a coronary CT for calcium. We will follow.  Jacob Marsh [name],MD

## 2021-02-14 NOTE — Patient Instructions (Addendum)
Medication Instructions:  Your physician recommends that you continue on your current medications as directed. Please refer to the Current Medication list given to you today.  Labwork: None ordered.  Testing/Procedures:  You will be scheduled for a calcium score.  Please schedule for calcium score CT with [facility].  Follow-Up: Your physician wants you to follow-up in: one year with Cristopher Peru, MD or one of the following Advanced Practice Providers on your designated Care Team:    Chanetta Marshall, NP  Tommye Standard, PA-C  Legrand Como "Jonni Sanger" Chalmers Cater, [place]   Any Other Special Instructions Will Be Listed Below (If Applicable).  If you need a refill on your cardiac medications before your next appointment, please call your pharmacy.

## 2021-02-18 ENCOUNTER — Other Ambulatory Visit: Payer: Self-pay

## 2021-02-18 ENCOUNTER — Encounter: Payer: Self-pay | Admitting: Family Medicine

## 2021-02-18 ENCOUNTER — Ambulatory Visit (INDEPENDENT_AMBULATORY_CARE_PROVIDER_SITE_OTHER): Payer: Medicare Other | Admitting: Family Medicine

## 2021-02-18 ENCOUNTER — Telehealth: Payer: Self-pay

## 2021-02-18 VITALS — BP 106/64 | HR 54 | Temp 97.0°F | Ht 71.5 in | Wt 151.6 lb

## 2021-02-18 DIAGNOSIS — R350 Frequency of micturition: Secondary | ICD-10-CM | POA: Diagnosis not present

## 2021-02-18 DIAGNOSIS — Z1589 Genetic susceptibility to other disease: Secondary | ICD-10-CM | POA: Diagnosis not present

## 2021-02-18 DIAGNOSIS — I4891 Unspecified atrial fibrillation: Secondary | ICD-10-CM | POA: Diagnosis not present

## 2021-02-18 DIAGNOSIS — Z125 Encounter for screening for malignant neoplasm of prostate: Secondary | ICD-10-CM

## 2021-02-18 DIAGNOSIS — I493 Ventricular premature depolarization: Secondary | ICD-10-CM | POA: Diagnosis not present

## 2021-02-18 DIAGNOSIS — M720 Palmar fascial fibromatosis [Dupuytren]: Secondary | ICD-10-CM | POA: Diagnosis not present

## 2021-02-18 DIAGNOSIS — R7989 Other specified abnormal findings of blood chemistry: Secondary | ICD-10-CM | POA: Diagnosis not present

## 2021-02-18 DIAGNOSIS — N401 Enlarged prostate with lower urinary tract symptoms: Secondary | ICD-10-CM | POA: Diagnosis not present

## 2021-02-18 DIAGNOSIS — E559 Vitamin D deficiency, unspecified: Secondary | ICD-10-CM | POA: Diagnosis not present

## 2021-02-18 NOTE — Assessment & Plan Note (Signed)
Surgery on R hand went well  Panning for L hand

## 2021-02-18 NOTE — Assessment & Plan Note (Signed)
Reviewed information from program in [place] for risk est for alz dementia (this increases risk for that and CAD) Pending further info  Also cardiology has ordered a CT for calcium scoring  No symptoms of dementia at all  Noted low D- treating Also mildly inc TSH (thyroid profile is being checked and will cc to [place])

## 2021-02-18 NOTE — Telephone Encounter (Signed)
Pt dropped off envelope with a letter addressed to Dr. Glori Bickers. Placed in provider's folder up front.

## 2021-02-18 NOTE — Assessment & Plan Note (Signed)
Doing well s/p ablation  Under cardiology care  takign flecainide and metoprolol  If levothyroxine is needed later -will need to use with caution

## 2021-02-18 NOTE — Assessment & Plan Note (Signed)
Lab Results  Component Value Date   PSA 1.91 02/12/2021   PSA 1.93 01/02/2020   PSA 2.38 12/29/2018    No fam hx No clinical changes

## 2021-02-18 NOTE — Patient Instructions (Addendum)
You need a full thyroid profile (including Free T4) to track thyroid function  Make sure I get copies   If you are interested in the new shingles vaccine (Shingrix) - call your local pharmacy to check on coverage and availability  If affordable, get on a wait list at your pharmacy to get the vaccine.  Start taking 4000 iu of vit D3 daily   I signed you up for the cologuard program

## 2021-02-18 NOTE — Progress Notes (Signed)
Subjective:    Patient ID: Jacob Marsh, male    DOB: 04/18/1951, 70 y.o.   MRN: [id number]  This visit occurred during the SARS-CoV-2 public health emergency.  Safety protocols were in place, including screening questions prior to the visit, additional usage of staff PPE, and extensive cleaning of exam room while observing appropriate contact time as indicated for disinfecting solutions.    HPI Pt presents for annual f/u of chronic medical problems   Wt Readings from Last 3 Encounters:  02/18/21 151 lb 9 oz (68.7 kg)  02/14/21 155 lb 6.4 oz (70.5 kg)  01/10/21 156 lb (70.8 kg)   20.84 kg/m  Still working  Winding down re: insurance and [place] protein shakes  Wants to gain some weight     Had amw on 3/23 -no gaps   covid immunized/moderna Zoster status - has not had the vaccine, is interested / plans to pay out of pocket if needed   Colon cancer screening  Colonoscopy 11/16-normal  M aunt- colon cancer Father had colon polyps PGM- colon cancer   Interested in cologuard -signed up today   H/o a fib with ablation (in January -did well)  Continues flecinide 100 mg bid and metoprolol 12.5 mg QHS Cardiology ordered a CT for calcium score recently    Some concerns about dementia prevention - he is enrolled in an extensive program 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,4       developed by holisitc practitioner in [place]- the ca score was recommended (this gene also inc his cardiovascular risk) This holistic professional works with him as a Engineer, maintenance  (will work in role as a Optometrist) Patent attorney is the program    D was low at 29 TSH mildly inc  Ferritin low at 38 Homocysteine mildly elevates 10.6 A1C nl at 5.1  Platelet 156   He worries about his kids and their risk   H/o BPH Lab Results  Component Value Date   PSA 1.91 02/12/2021   PSA 1.93 01/02/2020   PSA 2.38 12/29/2018  no medications currently  No changes in  frequency or trouble emptying  Is mindful of this     H/o depression -not at all now  Right now mood is ok  Caring for wife with alz Overwhelming at times    Cholesterol  Lab Results  Component Value Date   CHOL 143 02/12/2021   CHOL 160 01/02/2020   CHOL 169 12/29/2018   Lab Results  Component Value Date   HDL 56.50 02/12/2021   HDL 47.50 01/02/2020   HDL 59.40 12/29/2018   Lab Results  Component Value Date   LDLCALC 74 02/12/2021   [place] 98 01/02/2020   [place] 95 12/29/2018   Lab Results  Component Value Date   TRIG 59.0 02/12/2021   TRIG 73.0 01/02/2020   TRIG 74.0 12/29/2018   Lab Results  Component Value Date   CHOLHDL 3 02/12/2021   CHOLHDL 3 01/02/2020   CHOLHDL 3 12/29/2018   No results found for: LDLDIRECT   watching diet closely- excellent LDL   Exercise- walks dog for total of an hour per day  Weights 3 times per week   Still working    TSH is elevated this draw Lab Results  Component Value Date   TSH 4.52 (H) 02/12/2021   this is new   Other labs Lab Results  Component Value Date   CREATININE 0.78 02/12/2021  BUN 20 02/12/2021   NA 138 02/12/2021   K 4.0 02/12/2021   CL 102 02/12/2021   CO2 29 02/12/2021   Lab Results  Component Value Date   ALT 14 02/12/2021   AST 17 02/12/2021   ALKPHOS 47 02/12/2021   BILITOT 1.1 02/12/2021   Lab Results  Component Value Date   WBC 6.9 02/12/2021   HGB 14.8 02/12/2021   HCT 43.9 02/12/2021   MCV 96.1 02/12/2021   PLT 135.0 (L) 02/12/2021    Patient Active Problem List   Diagnosis Date Noted  . 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 Vitamin D deficiency 02/18/2021  . Elevated TSH 02/18/2021  . A-fib ([place]) 10/17/2019  . Atrial flutter ([place]) 10/17/2019  . Dupuytren contracture 12/29/2017  . Trigger finger of both hands   . Hx of echocardiogram   . Diverticulosis   . Depression   . BPH (benign prostatic hyperplasia) 09/15/2016  . Need for hepatitis C screening test 09/07/2016  .  Encounter for Medicare annual wellness exam 12/11/2014  . Screening for lipoid disorders 12/03/2014  . Hoarse 07/17/2014  . Globus sensation 07/17/2014  . PVCs (premature ventricular contractions) 03/20/2014  . Postural dizziness 03/20/2014  . Palpitations 08/10/2013  . History of chest pain 08/17/2012  . Routine general medical examination at a health care facility 08/09/2012  . Prostate cancer screening 08/09/2012   Past Medical History:  Diagnosis Date  . A-fib ([place]) 10/17/2019  . BPH (benign prostatic hyperplasia) 09/15/2016   Mild symptoms    . Depression   . Diverticulosis    History of diverticulosis// flex sig 2006//colonoscopy 01/21/2000 diverticulosis  . Dupuytren contracture 12/29/2017  . GERD (gastroesophageal reflux disease)   . Globus sensation 07/17/2014  . Hx of echocardiogram    Echo (9/14): EF 51%, grade 1 diastolic dysfunction, trivial AI, trivial MR, MAC, PASP 29  . Palpitations 08/10/2013  . Postural dizziness 03/20/2014  . PVC (premature ventricular contraction)   . PVCs (premature ventricular contractions) 03/20/2014  . Trigger finger of both hands    Past Surgical History:  Procedure Laterality Date  . A-FLUTTER ABLATION N/A 11/13/2019   Procedure: A-FLUTTER ABLATION;  Surgeon: Evans Lance, MD;  Location: [facility];  Service: Cardiovascular;  Laterality: N/A;  . DUPUYTREN CONTRACTURE RELEASE    . HAND SURGERY Right 08/2020  . TONSILLECTOMY    . WISDOM TOOTH EXTRACTION     Social History   Tobacco Use  . Smoking status: Never Smoker  . Smokeless tobacco: Never Used  Vaping Use  . Vaping Use: Never used  Substance Use Topics  . Alcohol use: Yes    Alcohol/week: 7.0 standard drinks    Types: 7 Glasses of wine per week    Comment: 2 oz every evening   . Drug use: No   Family History  Problem Relation Age of Onset  . Arthritis Mother   . Heart disease Mother        ? atrial fib  . Depression Sister   . Colon polyps Father   .  Cancer Maternal Aunt        ?colon cancer  . Heart disease Maternal Grandmother        CAD  . Cancer Maternal Grandmother        liver cancer  . Heart disease Maternal Grandfather        CAD  . Colon cancer Paternal Grandmother   . Sudden death Neg Hx    No Known Allergies Current Outpatient  Medications on File Prior to Visit  Medication Sig Dispense Refill  . acetaminophen (TYLENOL) 500 MG tablet Take 1,000 mg by mouth every 6 (six) hours as needed for moderate pain or headache.    . flecainide (TAMBOCOR) 100 MG tablet TAKE 1 TABLET BY MOUTH TWICE DAILY. MAY TAKE ADDITIONAL 1/2 TABLET DAILY FOR BREAKTHROUGH PALPITATIONS 225 tablet 2  . ibuprofen (ADVIL) 200 MG tablet Take 400 mg by mouth every 6 (six) hours as needed for headache or moderate pain.    . metoprolol tartrate (LOPRESSOR) 25 MG tablet TAKE 1/2 TABLET BY MOUTH EVERY NIGHT AT BEDTIME. MAY TAKE AN ADDITIONAL 1/2 TABLET AS NEEDED FOR PALPITATION 90 tablet 2  . Multiple Vitamin (MULTIVITAMIN WITH MINERALS) TABS Take 1 tablet by mouth daily.     . Omega-3 Fatty Acids (FISH OIL PO) Take 690 mg by mouth daily.     . Turmeric 500 MG CAPS Take 500 mg by mouth daily.     No current facility-administered medications on file prior to visit.     Review of Systems  Constitutional: Negative for activity change, appetite change, fatigue, fever and unexpected weight change.  HENT: Negative for congestion, rhinorrhea, sore throat and trouble swallowing.   Eyes: Negative for pain, redness, itching and visual disturbance.  Respiratory: Negative for cough, chest tightness, shortness of breath and wheezing.   Cardiovascular: Negative for chest pain and palpitations.  Gastrointestinal: Negative for abdominal pain, blood in stool, constipation, diarrhea and nausea.  Endocrine: Negative for cold intolerance, heat intolerance, polydipsia and polyuria.  Genitourinary: Negative for difficulty urinating, dysuria, frequency and urgency.   Musculoskeletal: Negative for arthralgias, joint swelling and myalgias.  Skin: Negative for pallor and rash.  Neurological: Negative for dizziness, tremors, weakness, numbness and headaches.  Hematological: Negative for adenopathy. Does not bruise/bleed easily.  Psychiatric/Behavioral: Negative for decreased concentration and dysphoric mood. The patient is not nervous/anxious.        Stressors        Objective:   Physical Exam Constitutional:      General: He is not in acute distress.    Appearance: Normal appearance. He is well-developed. He is not ill-appearing or diaphoretic.     Comments: Slim and well appearing  HENT:     Head: Normocephalic and atraumatic.     Right Ear: Tympanic membrane, ear canal and external ear normal.     Left Ear: Tympanic membrane, ear canal and external ear normal.     Nose: Nose normal. No congestion.     Mouth/Throat:     Mouth: Mucous membranes are moist.     Pharynx: Oropharynx is clear. No posterior oropharyngeal erythema.  Eyes:     General: No scleral icterus.       Right eye: No discharge.        Left eye: No discharge.     Conjunctiva/sclera: Conjunctivae normal.     Pupils: Pupils are equal, round, and reactive to light.  Neck:     Thyroid: No thyromegaly.     Vascular: No carotid bruit or JVD.  Cardiovascular:     Rate and Rhythm: Normal rate and regular rhythm.     Pulses: Normal pulses.     Heart sounds: Normal heart sounds. No gallop.   Pulmonary:     Effort: Pulmonary effort is normal. No respiratory distress.     Breath sounds: Normal breath sounds. No wheezing or rales.     Comments: Good air exch Chest:     Chest wall: No  tenderness.  Abdominal:     General: Bowel sounds are normal. There is no distension or abdominal bruit.     Palpations: Abdomen is soft. There is no mass.     Tenderness: There is no abdominal tenderness.     Hernia: No hernia is present.  Musculoskeletal:        General: No tenderness.      Cervical back: Normal range of motion and neck supple. No rigidity. No muscular tenderness.     Right lower leg: No edema.     Left lower leg: No edema.     Comments: No kyphosis   Dupuytren's changes in L hand are improved from surgery  Lymphadenopathy:     Cervical: No cervical adenopathy.  Skin:    General: Skin is warm and dry.     Coloration: Skin is not pale.     Findings: No erythema or rash.     Comments: Solar lentigines diffusely   Neurological:     Mental Status: He is alert.     Cranial Nerves: No cranial nerve deficit.     Motor: No abnormal muscle tone.     Coordination: Coordination normal.     Gait: Gait normal.     Deep Tendon Reflexes: Reflexes are normal and symmetric. Reflexes normal.  Psychiatric:        Attention and Perception: Attention normal.        Mood and Affect: Mood normal.        Cognition and Memory: Cognition and memory normal.     Comments: Mentally sharp           Assessment & Plan:   Problem List Items Addressed This Visit      Cardiovascular and Mediastinum   PVCs (premature ventricular contractions)    Under care of cardiology  Metoprolol is helpful      A-fib [facility])    Doing well s/p ablation  Under cardiology care  takign flecainide and metoprolol  If levothyroxine is needed later -will need to use with caution         Musculoskeletal and Integument   Dupuytren contracture    Surgery on R hand went well  Panning for L hand        Genitourinary   BPH (benign prostatic hyperplasia)    No clinical changes Lab Results  Component Value Date   PSA 1.91 02/12/2021   PSA 1.93 01/02/2020   PSA 2.38 12/29/2018            Other   Prostate cancer screening    Lab Results  Component Value Date   PSA 1.91 02/12/2021   PSA 1.93 01/02/2020   PSA 2.38 12/29/2018    No fam hx No clinical changes       APOE*3/*4 genotype - Primary    Reviewed information from program in [place] for risk est for alz dementia  (this increases risk for that and CAD) Pending further info  Also cardiology has ordered a CT for calcium scoring  No symptoms of dementia at all  Noted low D- treating Also mildly inc TSH (thyroid profile is being checked and will cc to [place])      Vitamin D deficiency    Level of 29 at outside lab Recommend 4000 iu of vit D 3 daily  Disc imp to bone and overall health      Elevated TSH    Lab Results  Component Value Date   TSH 4.52 (H) 02/12/2021  Pt has labs planned with his program for early dementia disturbance  Will cc results  Hesitant to treat in light of recently controlled a fib (unless ab necessary)

## 2021-02-18 NOTE — Assessment & Plan Note (Signed)
Under care of cardiology  Metoprolol is helpful

## 2021-02-18 NOTE — Assessment & Plan Note (Signed)
Level of 29 at outside lab Recommend 4000 iu of vit D 3 daily  Disc imp to bone and overall health

## 2021-02-18 NOTE — Assessment & Plan Note (Signed)
No clinical changes Lab Results  Component Value Date   PSA 1.91 02/12/2021   PSA 1.93 01/02/2020   PSA 2.38 12/29/2018

## 2021-02-18 NOTE — Telephone Encounter (Signed)
Letter in your in box. 

## 2021-02-18 NOTE — Assessment & Plan Note (Signed)
Lab Results  Component Value Date   TSH 4.52 (H) 02/12/2021   Pt has labs planned with his program for early dementia disturbance  Will cc results  Hesitant to treat in light of recently controlled a fib (unless ab necessary)

## 2021-03-04 DIAGNOSIS — M720 Palmar fascial fibromatosis [Dupuytren]: Secondary | ICD-10-CM | POA: Diagnosis not present

## 2021-03-04 DIAGNOSIS — G8918 Other acute postprocedural pain: Secondary | ICD-10-CM | POA: Diagnosis not present

## 2021-03-11 DIAGNOSIS — Z1211 Encounter for screening for malignant neoplasm of colon: Secondary | ICD-10-CM | POA: Diagnosis not present

## 2021-03-11 DIAGNOSIS — Z1212 Encounter for screening for malignant neoplasm of rectum: Secondary | ICD-10-CM | POA: Diagnosis not present

## 2021-03-17 ENCOUNTER — Ambulatory Visit (INDEPENDENT_AMBULATORY_CARE_PROVIDER_SITE_OTHER)
Admission: RE | Admit: 2021-03-17 | Discharge: 2021-03-17 | Disposition: A | Discharge status: Home / Self Care | Payer: Self-pay | Source: Ambulatory Visit | Attending: Internal Medicine | Admitting: Internal Medicine

## 2021-03-17 ENCOUNTER — Other Ambulatory Visit: Payer: Self-pay

## 2021-03-17 DIAGNOSIS — I4891 Unspecified atrial fibrillation: Secondary | ICD-10-CM

## 2021-03-19 DIAGNOSIS — M25642 Stiffness of left hand, not elsewhere classified: Secondary | ICD-10-CM | POA: Diagnosis not present

## 2021-03-19 LAB — COLOGUARD
COLOGUARD: NEGATIVE
Cologuard: NEGATIVE

## 2021-03-19 LAB — EXTERNAL GENERIC LAB PROCEDURE: COLOGUARD: NEGATIVE

## 2021-03-27 ENCOUNTER — Encounter: Payer: Self-pay | Admitting: Family Medicine

## 2021-04-01 DIAGNOSIS — M25642 Stiffness of left hand, not elsewhere classified: Secondary | ICD-10-CM | POA: Diagnosis not present

## 2021-04-07 DIAGNOSIS — L819 Disorder of pigmentation, unspecified: Secondary | ICD-10-CM | POA: Diagnosis not present

## 2021-04-07 DIAGNOSIS — L821 Other seborrheic keratosis: Secondary | ICD-10-CM | POA: Diagnosis not present

## 2021-04-08 DIAGNOSIS — M25642 Stiffness of left hand, not elsewhere classified: Secondary | ICD-10-CM | POA: Diagnosis not present

## 2021-04-14 DIAGNOSIS — M25642 Stiffness of left hand, not elsewhere classified: Secondary | ICD-10-CM | POA: Diagnosis not present

## 2021-04-18 ENCOUNTER — Encounter: Payer: Self-pay | Admitting: Family Medicine

## 2021-04-24 NOTE — Telephone Encounter (Signed)
[place] Night - Client Nonclinical Telephone Record [facility] Client [place] Night - Client Client [facility] [place] Physician [place], Roque Lias - MD Contact Type Call Who Is Calling Physician / Provider / Hospital Call Type Provider Call Message Only Reason for Call Request to send message to Office Initial Comment Dr. Deland Pretty is calling for patient, Jacob Marsh. His DOB is 06/19/1951. He can be called or texted at [phone]. He would like a text from Dr. Glori Bickers first to set up a time to talk. Dr. Deland Pretty is PST. Additional Comment Dr. Deland Pretty is calling for patient, Jacob Marsh. His DOB is 01/23/1951. He can be called or texted at [phone]. He would like a text from Dr. Glori Bickers first to set up a time to talk. Dr. Deland Pretty is PST. Disp. Time Disposition Final User 04/23/2021 7:21:25 PM General Information Provided Yes Fifer, Levada Dy Call Closed By: Hilarie Fredrickson Transaction Date/Time: 04/23/2021 7:18:02 PM (ET)

## 2021-05-08 NOTE — Telephone Encounter (Signed)
Dr. Deland Pretty called and would like a call back to set up a consultation about patient at [phone]

## 2021-05-28 ENCOUNTER — Encounter: Payer: Self-pay | Admitting: Family Medicine

## 2021-06-06 DIAGNOSIS — Z23 Encounter for immunization: Secondary | ICD-10-CM | POA: Diagnosis not present

## 2021-08-12 ENCOUNTER — Encounter: Payer: Self-pay | Admitting: Family Medicine

## 2021-08-12 ENCOUNTER — Other Ambulatory Visit: Payer: Self-pay | Admitting: Internal Medicine

## 2021-08-20 DIAGNOSIS — E889 Metabolic disorder, unspecified: Secondary | ICD-10-CM | POA: Diagnosis not present

## 2021-08-20 DIAGNOSIS — E349 Endocrine disorder, unspecified: Secondary | ICD-10-CM | POA: Diagnosis not present

## 2021-08-20 DIAGNOSIS — M6281 Muscle weakness (generalized): Secondary | ICD-10-CM | POA: Diagnosis not present

## 2021-09-24 DIAGNOSIS — I4891 Unspecified atrial fibrillation: Secondary | ICD-10-CM | POA: Diagnosis not present

## 2021-09-24 DIAGNOSIS — E60 Dietary zinc deficiency: Secondary | ICD-10-CM | POA: Diagnosis not present

## 2021-09-24 DIAGNOSIS — R634 Abnormal weight loss: Secondary | ICD-10-CM | POA: Diagnosis not present

## 2021-09-24 DIAGNOSIS — R5383 Other fatigue: Secondary | ICD-10-CM | POA: Diagnosis not present

## 2021-09-24 DIAGNOSIS — R0683 Snoring: Secondary | ICD-10-CM | POA: Diagnosis not present

## 2021-09-24 DIAGNOSIS — E559 Vitamin D deficiency, unspecified: Secondary | ICD-10-CM | POA: Diagnosis not present

## 2021-09-27 DIAGNOSIS — Z23 Encounter for immunization: Secondary | ICD-10-CM | POA: Diagnosis not present

## 2021-10-23 DIAGNOSIS — R634 Abnormal weight loss: Secondary | ICD-10-CM | POA: Diagnosis not present

## 2021-10-23 DIAGNOSIS — R5383 Other fatigue: Secondary | ICD-10-CM | POA: Diagnosis not present

## 2021-10-30 DIAGNOSIS — D225 Melanocytic nevi of trunk: Secondary | ICD-10-CM | POA: Diagnosis not present

## 2021-10-30 DIAGNOSIS — L821 Other seborrheic keratosis: Secondary | ICD-10-CM | POA: Diagnosis not present

## 2021-10-30 DIAGNOSIS — L814 Other melanin hyperpigmentation: Secondary | ICD-10-CM | POA: Diagnosis not present

## 2021-10-30 DIAGNOSIS — L570 Actinic keratosis: Secondary | ICD-10-CM | POA: Diagnosis not present

## 2021-11-25 ENCOUNTER — Encounter: Payer: Self-pay | Admitting: Family Medicine

## 2021-11-25 NOTE — Telephone Encounter (Signed)
Covid vaccines are up-to-date in pt's chart. Form and covid vaccine card printed and placed in PCP's inbox for review

## 2021-11-27 NOTE — Telephone Encounter (Signed)
Form faxed and pt notified of Dr. Marliss Coots comments. Copy of form sent to scanning

## 2021-12-04 DIAGNOSIS — Z20822 Contact with and (suspected) exposure to covid-19: Secondary | ICD-10-CM | POA: Diagnosis not present

## 2022-01-15 NOTE — Patient Instructions (Addendum)
Jacob Marsh , Thank you for taking time to come for your Medicare Wellness Visit. I appreciate your ongoing commitment to your health goals. Please review the following plan we discussed and let me know if I can assist you in the future.   Screening recommendations/referrals: Colonoscopy: Done 09/04/2015 - Repeat in 10 years - since you have a family history, I sent Dr Russella Dar a referral to repeat this sooner. Recommended yearly ophthalmology/optometry visit for glaucoma screening and checkup Recommended yearly dental visit for hygiene and checkup  Vaccinations: Influenza vaccine: Done 09/09/2021 - Repeat annually Pneumococcal vaccine: Done 12/11/2014 & 09/10/2016 Tdap vaccine: Done 03/19/2009 - Recommend Repeat every 10 years  *Past due - consider getting this soon Shingles vaccine: Due - Shingrix is 2 doses 2-6 months apart and over 90% effective  *consider getting this soon - insurance should cover Covid-19: Done 11/28/19, 12/26/19, 08/23/20, & 06/06/2021  Advanced directives: Please bring a copy of your health care power of attorney and living will to the office to be added to your chart at your convenience.   Conditions/risks identified: Keep up the great work!   Next appointment: Follow up in one year for your annual wellness visit.   Preventive Care 20 Years and Older, Male  Preventive care refers to lifestyle choices and visits with your health care provider that can promote health and wellness. What does preventive care include? A yearly physical exam. This is also called an annual well check. Dental exams once or twice a year. Routine eye exams. Ask your health care provider how often you should have your eyes checked. Personal lifestyle choices, including: Daily care of your teeth and gums. Regular physical activity. Eating a healthy diet. Avoiding tobacco and drug use. Limiting alcohol use. Practicing safe sex. Taking low doses of aspirin every day. Taking vitamin and mineral  supplements as recommended by your health care provider. What happens during an annual well check? The services and screenings done by your health care provider during your annual well check will depend on your age, overall health, lifestyle risk factors, and family history of disease. Counseling  Your health care provider may ask you questions about your: Alcohol use. Tobacco use. Drug use. Emotional well-being. Home and relationship well-being. Sexual activity. Eating habits. History of falls. Memory and ability to understand (cognition). Work and work Astronomer. Screening  You may have the following tests or measurements: Height, weight, and BMI. Blood pressure. Lipid and cholesterol levels. These may be checked every 5 years, or more frequently if you are over 40 years old. Skin check. Lung cancer screening. You may have this screening every year starting at age 75 if you have a 30-pack-year history of smoking and currently smoke or have quit within the past 15 years. Fecal occult blood test (FOBT) of the stool. You may have this test every year starting at age 76. Flexible sigmoidoscopy or colonoscopy. You may have a sigmoidoscopy every 5 years or a colonoscopy every 10 years starting at age 62. Prostate cancer screening. Recommendations will vary depending on your family history and other risks. Hepatitis C blood test. Hepatitis B blood test. Sexually transmitted disease (STD) testing. Diabetes screening. This is done by checking your blood sugar (glucose) after you have not eaten for a while (fasting). You may have this done every 1-3 years. Abdominal aortic aneurysm (AAA) screening. You may need this if you are a current or former smoker. Osteoporosis. You may be screened starting at age 95 if you are at high  risk. Talk with your health care provider about your test results, treatment options, and if necessary, the need for more tests. Vaccines  Your health care provider  may recommend certain vaccines, such as: Influenza vaccine. This is recommended every year. Tetanus, diphtheria, and acellular pertussis (Tdap, Td) vaccine. You may need a Td booster every 10 years. Zoster vaccine. You may need this after age 45. Pneumococcal 13-valent conjugate (PCV13) vaccine. One dose is recommended after age 60. Pneumococcal polysaccharide (PPSV23) vaccine. One dose is recommended after age 81. Talk to your health care provider about which screenings and vaccines you need and how often you need them. This information is not intended to replace advice given to you by your health care provider. Make sure you discuss any questions you have with your health care provider. Document Released: 11/08/2015 Document Revised: 07/01/2016 Document Reviewed: 08/13/2015 Elsevier Interactive Patient Education  2017 [employer].  Fall Prevention in the Home Falls can cause injuries. They can happen to people of all ages. There are many things you can do to make your home safe and to help prevent falls. What can I do on the outside of my home? Regularly fix the edges of walkways and driveways and fix any cracks. Remove anything that might make you trip as you walk through a door, such as a raised step or threshold. Trim any bushes or trees on the path to your home. Use bright outdoor lighting. Clear any walking paths of anything that might make someone trip, such as rocks or tools. Regularly check to see if handrails are loose or broken. Make sure that both sides of any steps have handrails. Any raised decks and porches should have guardrails on the edges. Have any leaves, snow, or ice cleared regularly. Use sand or salt on walking paths during winter. Clean up any spills in your garage right away. This includes oil or grease spills. What can I do in the bathroom? Use night lights. Install grab bars by the toilet and in the tub and shower. Do not use towel bars as grab bars. Use  non-skid mats or decals in the tub or shower. If you need to sit down in the shower, use a plastic, non-slip stool. Keep the floor dry. Clean up any water that spills on the floor as soon as it happens. Remove soap buildup in the tub or shower regularly. Attach bath mats securely with double-sided non-slip rug tape. Do not have throw rugs and other things on the floor that can make you trip. What can I do in the bedroom? Use night lights. Make sure that you have a light by your bed that is easy to reach. Do not use any sheets or blankets that are too big for your bed. They should not hang down onto the floor. Have a firm chair that has side arms. You can use this for support while you get dressed. Do not have throw rugs and other things on the floor that can make you trip. What can I do in the kitchen? Clean up any spills right away. Avoid walking on wet floors. Keep items that you use a lot in easy-to-reach places. If you need to reach something above you, use a strong step stool that has a grab bar. Keep electrical cords out of the way. Do not use floor polish or wax that makes floors slippery. If you must use wax, use non-skid floor wax. Do not have throw rugs and other things on the floor that can  make you trip. What can I do with my stairs? Do not leave any items on the stairs. Make sure that there are handrails on both sides of the stairs and use them. Fix handrails that are broken or loose. Make sure that handrails are as long as the stairways. Check any carpeting to make sure that it is firmly attached to the stairs. Fix any carpet that is loose or worn. Avoid having throw rugs at the top or bottom of the stairs. If you do have throw rugs, attach them to the floor with carpet tape. Make sure that you have a light switch at the top of the stairs and the bottom of the stairs. If you do not have them, ask someone to add them for you. What else can I do to help prevent falls? Wear  shoes that: Do not have high heels. Have rubber bottoms. Are comfortable and fit you well. Are closed at the toe. Do not wear sandals. If you use a stepladder: Make sure that it is fully opened. Do not climb a closed stepladder. Make sure that both sides of the stepladder are locked into place. Ask someone to hold it for you, if possible. Clearly mark and make sure that you can see: Any grab bars or handrails. First and last steps. Where the edge of each step is. Use tools that help you move around (mobility aids) if they are needed. These include: Canes. Walkers. Scooters. Crutches. Turn on the lights when you go into a dark area. Replace any light bulbs as soon as they burn out. Set up your furniture so you have a clear path. Avoid moving your furniture around. If any of your floors are uneven, fix them. If there are any pets around you, be aware of where they are. Review your medicines with your doctor. Some medicines can make you feel dizzy. This can increase your chance of falling. Ask your doctor what other things that you can do to help prevent falls. This information is not intended to replace advice given to you by your health care provider. Make sure you discuss any questions you have with your health care provider. Document Released: 08/08/2009 Document Revised: 03/19/2016 Document Reviewed: 11/16/2014 Elsevier Interactive Patient Education  2017 [employer].

## 2022-01-16 ENCOUNTER — Ambulatory Visit (INDEPENDENT_AMBULATORY_CARE_PROVIDER_SITE_OTHER): Payer: Medicare Other

## 2022-01-16 VITALS — Wt 145.0 lb

## 2022-01-16 DIAGNOSIS — Z1211 Encounter for screening for malignant neoplasm of colon: Secondary | ICD-10-CM

## 2022-01-16 DIAGNOSIS — Z0000 Encounter for general adult medical examination without abnormal findings: Secondary | ICD-10-CM

## 2022-01-16 DIAGNOSIS — Z800 Family history of malignant neoplasm of digestive organs: Secondary | ICD-10-CM

## 2022-01-16 NOTE — Progress Notes (Signed)
Subjective:   Jacob Marsh is a 71 y.o. male who presents for Medicare Annual/Subsequent preventive examination.  Virtual Visit via Telephone Note  I connected with  Jacob Marsh on 01/16/22 at  1:15 PM EDT by telephone and verified that I am speaking with the correct person using two identifiers.  Location: Patient: Home Provider: [facility] Persons participating in the virtual visit: patient/Nurse Health Advisor   I discussed the limitations, risks, security and privacy concerns of performing an evaluation and management service by telephone and the availability of in person appointments. The patient expressed understanding and agreed to proceed.  Interactive audio and video telecommunications were attempted between this nurse and patient, however failed, due to patient having technical difficulties OR patient did not have access to video capability.  We continued and completed visit with audio only.  Some vital signs may be absent or patient reported.   Jacob Marsh E Rie Mcneil, LPN   Review of Systems     Cardiac Risk Factors include: advanced age (>63men, >64 women);male gender;Other (see comment), Risk factor comments: A.Fib     Objective:    Today's Vitals   01/16/22 1257  Weight: 145 lb (65.8 kg)   Body mass index is 19.94 kg/m.     01/16/2022    1:03 PM 01/15/2021    1:06 PM 01/02/2020   12:13 PM 11/13/2019    6:16 AM 09/14/2017    9:22 AM 09/10/2016    9:19 AM 08/21/2015    4:19 PM  Advanced Directives  Does Patient Have a Medical Advance Directive? Yes Yes Yes Yes Yes Yes Yes  Type of Estate agent of [place];Living will Healthcare Power of [place];Living will Healthcare Power of [place];Living will Healthcare Power of [place];Living will Healthcare Power of [place];Living will Healthcare Power of [place];Living will Healthcare Power of [place];Living will  Does patient want to make changes to medical advance directive?    No -  Patient declined  No - Patient declined No - Patient declined  Copy of Healthcare Power of Attorney in Chart? No - copy requested No - copy requested No - copy requested No - copy requested No - copy requested No - copy requested No - copy requested    Current Medications (verified) Outpatient Encounter Medications as of 01/16/2022  Medication Sig   acetaminophen (TYLENOL) 500 MG tablet Take 1,000 mg by mouth every 6 (six) hours as needed for moderate pain or headache.   flecainide (TAMBOCOR) 100 MG tablet TAKE 1 TABLET TWICE DAILY, MAY TAKE ADDITIONAL 1/2 TABLET DAILY FOR BREAKTHROUGH PALPITATIONS   ibuprofen (ADVIL) 200 MG tablet Take 400 mg by mouth every 6 (six) hours as needed for headache or moderate pain.   metoprolol tartrate (LOPRESSOR) 25 MG tablet TAKE 1/2 TABLET BY MOUTH EVERY NIGHT AT BEDTIME. MAY TAKE AN ADDITIONAL 1/2 TABLET AS NEEDED FOR PALPITATION   Multiple Vitamin (MULTIVITAMIN WITH MINERALS) TABS Take 1 tablet by mouth daily.    Omega-3 Fatty Acids (FISH OIL PO) Take 690 mg by mouth daily.    Turmeric 500 MG CAPS Take 500 mg by mouth daily.   No facility-administered encounter medications on file as of 01/16/2022.    Allergies (verified) Patient has no known allergies.   History: Past Medical History:  Diagnosis Date   A-fib (HCC) 10/17/2019   BPH (benign prostatic hyperplasia) 09/15/2016   Mild symptoms     Depression    Diverticulosis    History of diverticulosis// flex sig 2006//colonoscopy 01/21/2000  diverticulosis   Dupuytren contracture 12/29/2017   GERD (gastroesophageal reflux disease)    Globus sensation 07/17/2014   Hx of echocardiogram    Echo (9/14): EF 55%, grade 1 diastolic dysfunction, trivial AI, trivial MR, MAC, PASP 29   Palpitations 08/10/2013   Postural dizziness 03/20/2014   PVC (premature ventricular contraction)    PVCs (premature ventricular contractions) 03/20/2014   Trigger finger of both hands    Past Surgical History:  Procedure  Laterality Date   A-FLUTTER ABLATION N/A 11/13/2019   Procedure: A-FLUTTER ABLATION;  Surgeon: Marinus Maw, MD;  Location: [facility];  Service: Cardiovascular;  Laterality: N/A;   DUPUYTREN CONTRACTURE RELEASE     HAND SURGERY Right 08/2020   TONSILLECTOMY     WISDOM TOOTH EXTRACTION     Family History  Problem Relation Age of Onset   Arthritis Mother    Heart disease Mother        ? atrial fib   Colon polyps Father    Depression Sister    Cancer Maternal Aunt        ?colon cancer   Heart disease Maternal Grandmother        CAD   Cancer Maternal Grandmother        liver cancer   Heart disease Maternal Grandfather        CAD   Colon cancer Paternal Grandmother    Sudden death Neg Hx    Social History   Socioeconomic History   Marital status: Married    Spouse name: Not on file   Number of children: 3   Years of education: Not on file   Highest education level: Not on file  Occupational History   Occupation: works from home  Tobacco Use   Smoking status: Never   Smokeless tobacco: Never  Vaping Use   Vaping Use: Never used  Substance and Sexual Activity   Alcohol use: Yes    Alcohol/week: 7.0 standard drinks    Types: 7 Glasses of wine per week    Comment: 2 oz every evening    Drug use: No   Sexual activity: Yes  Other Topics Concern   Not on file  Social History Narrative   Wife has alzheimer's and is living in a SNF since 2020   Still working as well    Children live in [place], [place], [place], [place], and [place], but they visit each other several times per year   He's close with his sister who lives nearby   Social Determinants of Health   Financial Resource Strain: Low Risk    Difficulty of Paying Living Expenses: Not hard at all  Food Insecurity: No Food Insecurity   Worried About Programme researcher, broadcasting/film/video in the Last Year: Never true   Barista in the Last Year: Never true  Transportation Needs: No Transportation Needs   Lack of  Transportation (Medical): No   Lack of Transportation (Non-Medical): No  Physical Activity: Sufficiently Active   Days of Exercise per Week: 7 days   Minutes of Exercise per Session: 60 min  Stress: No Stress Concern Present   Feeling of Stress : Not at all  Social Connections: Moderately Integrated   Frequency of Communication with Friends and Family: More than three times a week   Frequency of Social Gatherings with Friends and Family: More than three times a week   Attends Religious Services: Never   Database administrator or Organizations: Yes   Attends  Engineer, structural: More than 4 times per year   Marital Status: Married    Tobacco Counseling Counseling given: Not Answered   Clinical Intake:  Pre-visit preparation completed: Yes  Pain : No/denies pain     BMI - recorded: 19.94 Nutritional Status: BMI of 19-24  Normal Nutritional Risks: None Diabetes: No  How often do you need to have someone help you when you read instructions, pamphlets, or other written materials from your doctor or pharmacy?: 1 - Never  Diabetic? no  Interpreter Needed?: No  Information entered by :: Tulip Meharg, LPN   Activities of Daily Living    01/16/2022    1:03 PM  In your present state of health, do you have any difficulty performing the following activities:  Hearing? 0  Vision? 0  Difficulty concentrating or making decisions? 0  Walking or climbing stairs? 0  Dressing or bathing? 0  Doing errands, shopping? 0  Preparing Food and eating ? N  Using the Toilet? N  In the past six months, have you accidently leaked urine? N  Do you have problems with loss of bowel control? N  Managing your Medications? N  Managing your Finances? N  Housekeeping or managing your Housekeeping? N    Patient Care Team: Tower, Audrie Gallus, MD as PCP - General Ladona Ridgel Doylene Canning, MD as PCP - Cardiology (Cardiology) Marinus Maw, MD as PCP - Electrophysiology (Cardiology) Wendall Stade, MD as Consulting Physician (Cardiology) Marinus Maw, MD as Consulting Physician (Cardiology) Sinda Du, MD as Consulting Physician (Ophthalmology) Cherlyn Roberts, MD as Consulting Physician (Dermatology) Casimer Leek, DDS, PA as Referring Physician (Dentistry)  Indicate any recent Medical Services you may have received from other than [facility] providers in the past year (date may be approximate).     Assessment:   This is a routine wellness examination for Elery.  Hearing/Vision screen Hearing Screening - Comments:: Denies hearing difficulties   Vision Screening - Comments:: Wears otc reading glasses prn - up to date with routine eye exams with [facility]  Dietary issues and exercise activities discussed: Current Exercise Habits: Home exercise routine, Type of exercise: walking;strength training/weights, Time (Minutes): 60, Frequency (Times/Week): 7, Weekly Exercise (Minutes/Week): 420, Intensity: Mild, Exercise limited by: orthopedic condition(s);cardiac condition(s)   Goals Addressed             This Visit's Progress    Patient Stated   On track    01/16/2022 - I will continue to walk everyday for about 2-3 miles and lift weights 3x per week       Depression Screen    01/16/2022    1:02 PM 01/15/2021    1:08 PM 01/02/2020   12:16 PM 01/03/2019   10:18 AM 09/14/2017    9:21 AM 09/10/2016    9:19 AM 12/11/2014    2:50 PM  PHQ 2/9 Scores  PHQ - 2 Score 0 0 0 0 0 0 0  PHQ- 9 Score  0 0  2      Fall Risk    01/16/2022   12:59 PM 01/15/2021    1:08 PM 01/02/2020   12:14 PM 01/03/2019   10:18 AM 09/14/2017    9:21 AM  Fall Risk   Falls in the past year? 0 0 1 1 No  Comment   slipped on ice    Number falls in past yr: 0 0 0 0   Injury with Fall? 0 0 0 0   Risk  for fall due to : Orthopedic patient Medication side effect Medication side effect    Follow up Falls prevention discussed Falls evaluation completed;Falls prevention discussed Falls  evaluation completed;Falls prevention discussed      FALL RISK PREVENTION PERTAINING TO THE HOME:  Any stairs in or around the home? No  If so, are there any without handrails? No  Home free of loose throw rugs in walkways, pet beds, electrical cords, etc? Yes  Adequate lighting in your home to reduce risk of falls? Yes   ASSISTIVE DEVICES UTILIZED TO PREVENT FALLS:  Life alert? No  Use of a cane, walker or w/c? No  Grab bars in the bathroom? Yes  Shower chair or bench in shower? No  Elevated toilet seat or a handicapped toilet? No   TIMED UP AND GO:  Was the test performed? No . Telephonic visit  Cognitive Function: Normal cognitive status assessed by direct observation by this Nurse Health Advisor. No abnormalities found.       01/15/2021    1:10 PM 01/02/2020   12:19 PM 09/14/2017    9:25 AM 09/10/2016    9:30 AM  MMSE - Mini Mental State Exam  Orientation to time 5 5 5 5   Orientation to Place 5 5 5 5   Registration 3 3 3 3   Attention/ Calculation 5 5 0 0  Recall 3 3 3 3   Language- name 2 objects   0 0  Language- repeat 1 1 1 1   Language- follow 3 step command   3 3  Language- read & follow direction   0 0  Write a sentence   0 0  Copy design   0 0  Total score   20 20        Immunizations Immunization History  Administered Date(s) Administered   Fluad Quad(high Dose 65+) 08/08/2019   Influenza Split 08/17/2012   Influenza, High Dose Seasonal PF 09/10/2018   Influenza,inj,Quad PF,6+ Mos 12/11/2014, 09/10/2016, 09/14/2017   Influenza-Unspecified 09/09/2021   Moderna SARS-COV2 Booster Vaccination 06/06/2021   Moderna Sars-Covid-2 Vaccination 11/28/2019, 12/26/2019, 08/23/2020   Pneumococcal Conjugate-13 09/10/2016   Pneumococcal Polysaccharide-23 12/11/2014   Td 09/26/1999, 03/19/2009    TDAP status: Due, Education has been provided regarding the importance of this vaccine. Advised may receive this vaccine at local pharmacy or Health Dept. Aware to provide a  copy of the vaccination record if obtained from local pharmacy or Health Dept. Verbalized acceptance and understanding.  Flu Vaccine status: Up to date  Pneumococcal vaccine status: Up to date  Covid-19 vaccine status: Completed vaccines  Qualifies for Shingles Vaccine? Yes   Zostavax completed No   Shingrix Completed?: No.    Education has been provided regarding the importance of this vaccine. Patient has been advised to call insurance company to determine out of pocket expense if they have not yet received this vaccine. Advised may also receive vaccine at local pharmacy or Health Dept. Verbalized acceptance and understanding.  Screening Tests Health Maintenance  Topic Date Due   Zoster Vaccines- Shingrix (1 of 2) Never done   COVID-19 Vaccine (4 - Booster for Moderna series) 08/01/2021   TETANUS/TDAP  01/02/2024 (Originally 03/20/2019)   COLONOSCOPY (Pts 45-93yrs Insurance coverage will need to be confirmed)  09/03/2025   Pneumonia Vaccine 18+ Years old  Completed   INFLUENZA VACCINE  Completed   Hepatitis C Screening  Completed   HPV VACCINES  Aged Out    Health Maintenance  Health Maintenance Due  Topic Date Due  Zoster Vaccines- Shingrix (1 of 2) Never done   COVID-19 Vaccine (4 - Booster for Moderna series) 08/01/2021    Colorectal cancer screening: Type of screening: Colonoscopy. Completed 09/04/2015. Repeat every 5-7 years placed order today as requested by patient  Lung Cancer Screening: (Low Dose CT Chest recommended if Age 31-80 years, 30 pack-year currently smoking OR have quit w/in 15years.) does not qualify.   Additional Screening:  Hepatitis C Screening: does qualify; Completed 09/10/2016  Vision Screening: Recommended annual ophthalmology exams for early detection of glaucoma and other disorders of the eye. Is the patient up to date with their annual eye exam?  Yes  Who is the provider or what is the name of the office in which the patient attends annual eye  exams? [facility] If pt is not established with a provider, would they like to be referred to a provider to establish care? No .   Dental Screening: Recommended annual dental exams for proper oral hygiene  Community Resource Referral / Chronic Care Management: CRR required this visit?  No   CCM required this visit?  No      Plan:     I have personally reviewed and noted the following in the patient's chart:   Medical and social history Use of alcohol, tobacco or illicit drugs  Current medications and supplements including opioid prescriptions. Patient is not currently taking opioid prescriptions. Functional ability and status Nutritional status Physical activity Advanced directives List of other physicians Hospitalizations, surgeries, and ER visits in previous 12 months Vitals Screenings to include cognitive, depression, and falls Referrals and appointments  In addition, I have reviewed and discussed with patient certain preventive protocols, quality metrics, and best practice recommendations. A written personalized care plan for preventive services as well as general preventive health recommendations were provided to patient.   Due to this being a telephonic visit, the after visit summary with patients personalized plan was offered to patient via mail or my-chart. Patient would like to access on my-chart/  [place] Constable, LPN   5/62/1308   Nurse Notes: None

## 2022-02-16 DIAGNOSIS — Z20822 Contact with and (suspected) exposure to covid-19: Secondary | ICD-10-CM | POA: Diagnosis not present

## 2022-02-27 DIAGNOSIS — Z20822 Contact with and (suspected) exposure to covid-19: Secondary | ICD-10-CM | POA: Diagnosis not present

## 2022-03-01 IMAGING — CT CT CARDIAC CORONARY ARTERY CALCIUM SCORE
3 series · 14 of 20 positions shown, 15 images · non-contrast
Comparison: None.
COMPARISON: None.

Addendum:
EXAM:
OVER-READ INTERPRETATION  CT CHEST

The following report is an over-read performed by radiologist Dr.
Davidino Parente [REDACTED] on 03/17/2021. This
over-read does not include interpretation of cardiac or coronary
anatomy or pathology. The coronary calcium score interpretation by
the cardiologist is attached.
CLINICAL DATA: Cardiovascular Disease Risk stratification
Coronary Calcium Score
TECHNIQUE: A gated, non-contrast computed tomography scan of the heart was
performed using 3mm slice thickness. Axial images were analyzed on a
dedicated workstation. Calcium scoring of the coronary arteries was
performed using the Agatston method.

[Series 2: casc 3.0 bv41 2 bestsyst 289 ms · axial · 0.38mm/px · z∈[-336,-244]mm · 4 of 53 slices shown, 5 images]
[im 11/53  vessel]
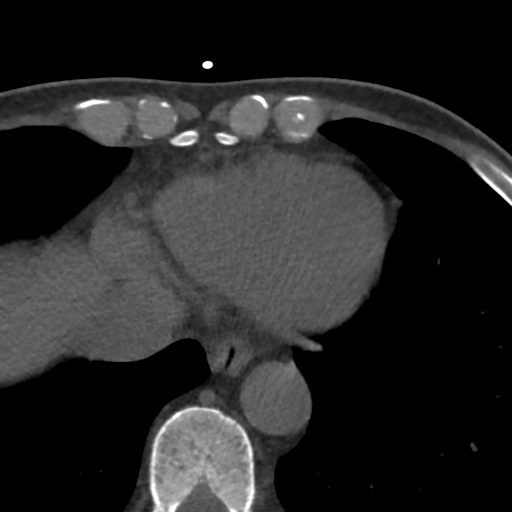
[im 11/53  lung]
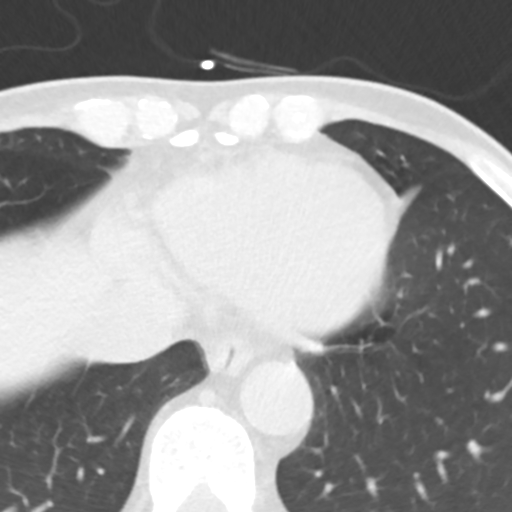
[im 21/53  vessel]
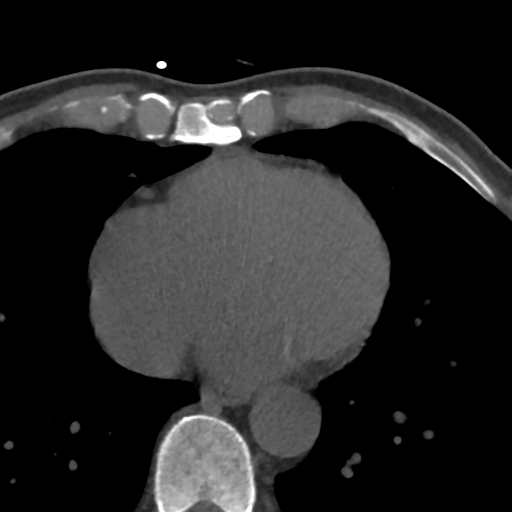
[im 32/53  vessel]
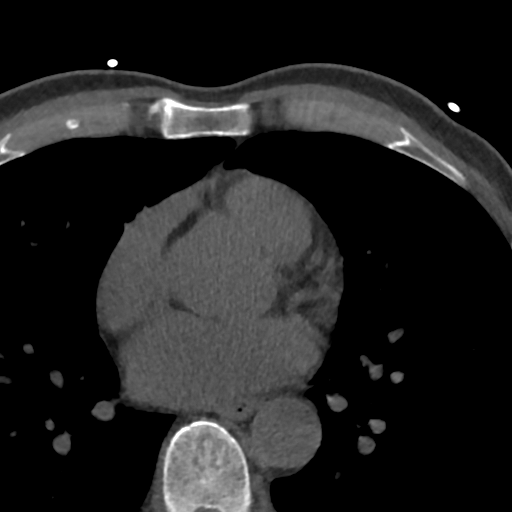
[im 42/53  vessel]
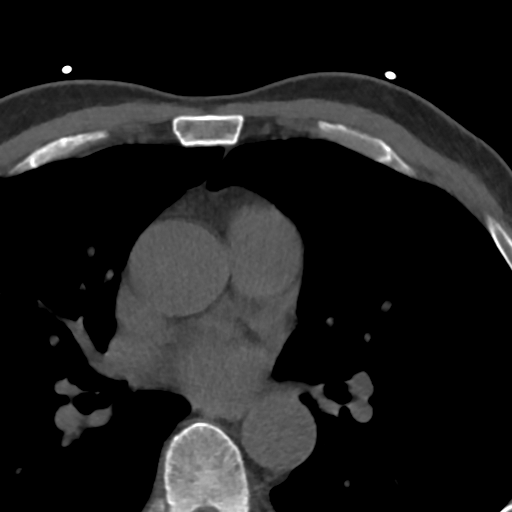

[Series 3: lung 285 ms · axial · 0.72mm/px · z∈[-342,-238]mm · 5 of 53 slices shown]
[im 9/53  lung]
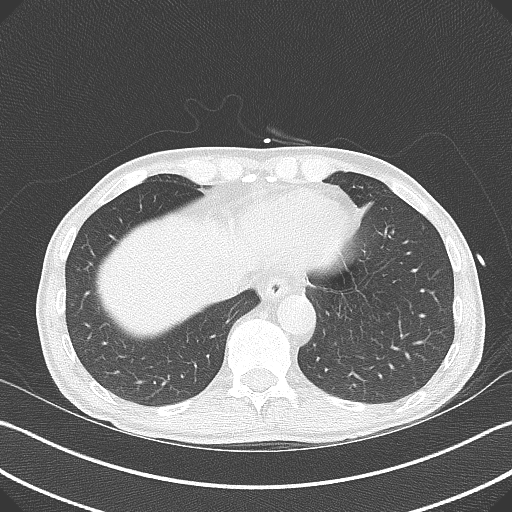
[im 18/53  lung]
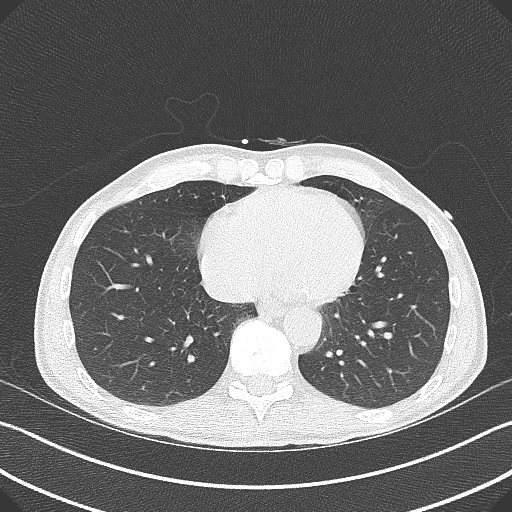
[im 27/53  lung]
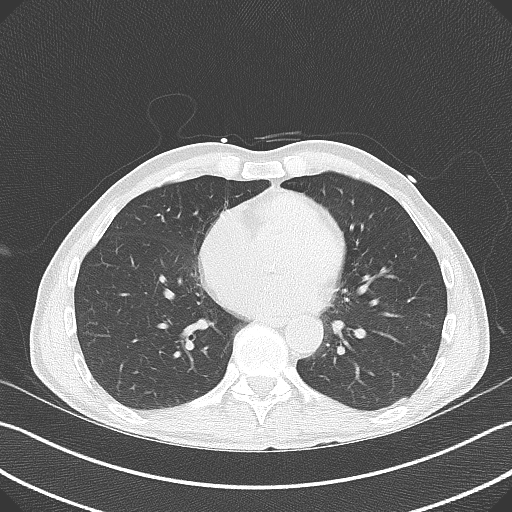
[im 35/53  lung]
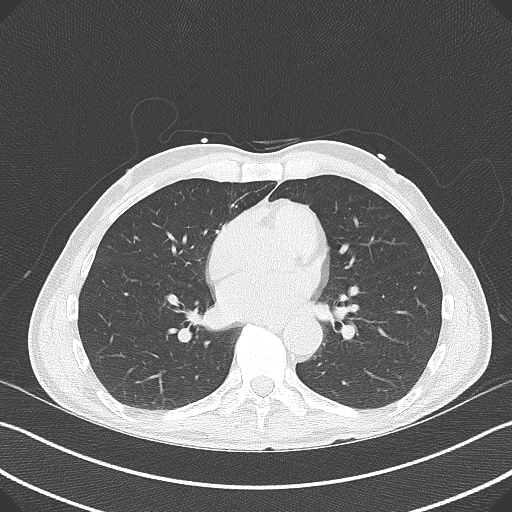
[im 44/53  lung]
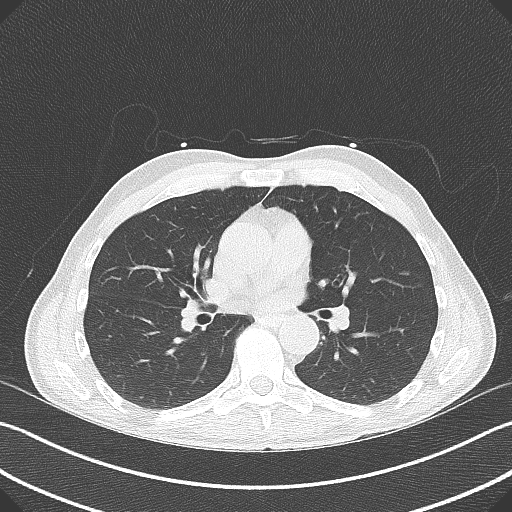

[Series 4: lung st 285 ms · axial · 0.72mm/px · z∈[-342,-238]mm · 5 of 53 slices shown]
[im 9/53  lung]
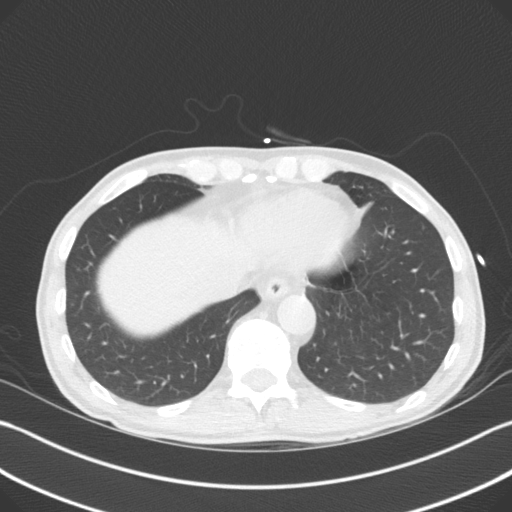
[im 18/53  lung]
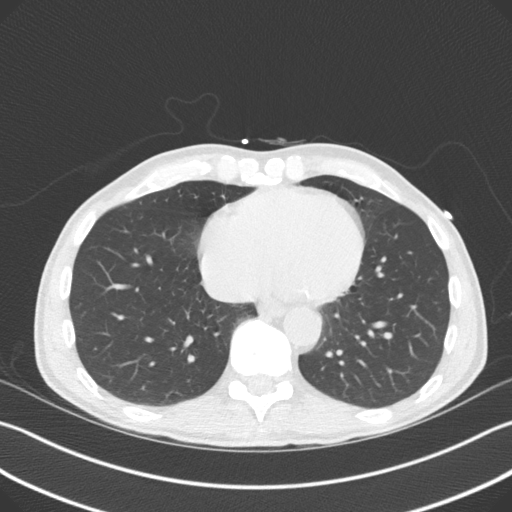
[im 27/53  lung]
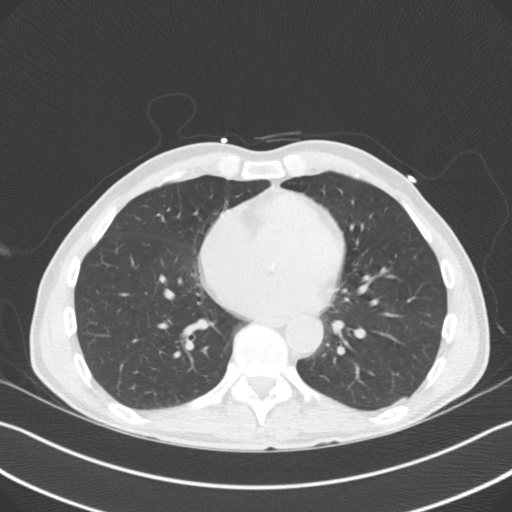
[im 35/53  lung]
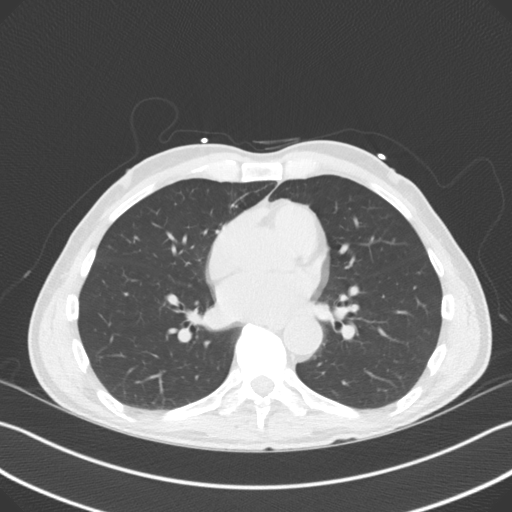
[im 44/53  lung]
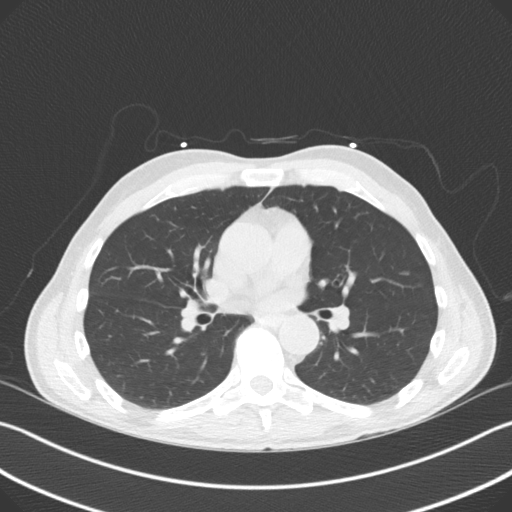

[14 of 20 positions shown; findings below may reference images not displayed]

FINDINGS: Aortic atherosclerosis. Within the visualized portions of the thorax
there are no suspicious appearing pulmonary nodules or masses, there
is no acute consolidative airspace disease, no pleural effusions, no
pneumothorax and no lymphadenopathy. Visualized portions of the
upper abdomen are unremarkable. There are no aggressive appearing
lytic or blastic lesions noted in the visualized portions of the
skeleton.
IMPRESSION: 1.  Aortic Atherosclerosis (R9E2B-1V8.8).
FINDINGS: Coronary arteries: Normal origins.

Coronary Calcium Score:

Left main:

Left anterior descending artery: 0

Left circumflex artery: 0

Right coronary artery: 0

Total:

Percentile: 19th

Pericardium: Normal.

Ascending Aorta: Normal caliber.

Non-cardiac: See separate report from [REDACTED].
IMPRESSION: Coronary calcium score of 11.1. This was 19th percentile for age-,
race-, and sex-matched controls.



If CAC=0, it is reasonable to withhold statin therapy and reassess
in 5 to 10 years, as long as higher risk conditions are absent
(diabetes mellitus, family history of premature CHD in first degree
relatives (males <55 years; females <65 years), cigarette smoking,
or LDL >=190 mg/dL).

If CAC is 1 to 99, it is reasonable to initiate statin therapy for
patients >=55 years of age.

If CAC is >=100 or >=75th percentile, it is reasonable to initiate
statin therapy at any age.

Cardiology referral should be considered for patients with CAC
scores >=400 or >=75th percentile.

*5435 AHA/ACC/AACVPR/AAPA/ABC/KAKI/GIORGI/SHRESTHA/[employer]/RECEK/GARDESEY/VIRKA
Guideline on the Management of Blood Cholesterol: A Report of the
American College of Cardiology/American Heart Association Task Force
on Clinical Practice Guidelines. J Am Coll Cardiol.
2583;73(24):4120-4788.

*** End of Addendum ***
EXAM:
OVER-READ INTERPRETATION  CT CHEST

The following report is an over-read performed by radiologist Dr.
Davidino Parente [REDACTED] on 03/17/2021. This
over-read does not include interpretation of cardiac or coronary
anatomy or pathology. The coronary calcium score interpretation by
the cardiologist is attached.
FINDINGS: Aortic atherosclerosis. Within the visualized portions of the thorax
there are no suspicious appearing pulmonary nodules or masses, there
is no acute consolidative airspace disease, no pleural effusions, no
pneumothorax and no lymphadenopathy. Visualized portions of the
upper abdomen are unremarkable. There are no aggressive appearing
lytic or blastic lesions noted in the visualized portions of the
skeleton.
IMPRESSION: 1.  Aortic Atherosclerosis (R9E2B-1V8.8).

## 2022-03-15 ENCOUNTER — Telehealth: Payer: Self-pay | Admitting: Family Medicine

## 2022-03-15 DIAGNOSIS — E559 Vitamin D deficiency, unspecified: Secondary | ICD-10-CM

## 2022-03-15 DIAGNOSIS — Z13220 Encounter for screening for lipoid disorders: Secondary | ICD-10-CM

## 2022-03-15 DIAGNOSIS — Z125 Encounter for screening for malignant neoplasm of prostate: Secondary | ICD-10-CM

## 2022-03-15 DIAGNOSIS — Z0000 Encounter for general adult medical examination without abnormal findings: Secondary | ICD-10-CM

## 2022-03-15 DIAGNOSIS — N401 Enlarged prostate with lower urinary tract symptoms: Secondary | ICD-10-CM

## 2022-03-15 DIAGNOSIS — R002 Palpitations: Secondary | ICD-10-CM

## 2022-03-15 DIAGNOSIS — R7989 Other specified abnormal findings of blood chemistry: Secondary | ICD-10-CM

## 2022-03-15 NOTE — Telephone Encounter (Signed)
He will need to sign a waiver for the FT4

## 2022-03-15 NOTE — Telephone Encounter (Signed)
-----   Message from Velna Hatchet, RT sent at 03/02/2022 12:41 PM EDT ----- Regarding: Lab Mon 03/16/22 Patient is scheduled for cpx, please order future labs.  Thanks, Anda Kraft

## 2022-03-16 ENCOUNTER — Other Ambulatory Visit (INDEPENDENT_AMBULATORY_CARE_PROVIDER_SITE_OTHER): Payer: Medicare Other

## 2022-03-16 DIAGNOSIS — Z13220 Encounter for screening for lipoid disorders: Secondary | ICD-10-CM

## 2022-03-16 DIAGNOSIS — R7989 Other specified abnormal findings of blood chemistry: Secondary | ICD-10-CM

## 2022-03-16 DIAGNOSIS — R350 Frequency of micturition: Secondary | ICD-10-CM

## 2022-03-16 DIAGNOSIS — R002 Palpitations: Secondary | ICD-10-CM | POA: Diagnosis not present

## 2022-03-16 DIAGNOSIS — E559 Vitamin D deficiency, unspecified: Secondary | ICD-10-CM | POA: Diagnosis not present

## 2022-03-16 DIAGNOSIS — N401 Enlarged prostate with lower urinary tract symptoms: Secondary | ICD-10-CM

## 2022-03-16 DIAGNOSIS — Z125 Encounter for screening for malignant neoplasm of prostate: Secondary | ICD-10-CM

## 2022-03-16 LAB — COMPREHENSIVE METABOLIC PANEL
ALT: 18 U/L (ref 0–53)
AST: 27 U/L (ref 0–37)
Albumin: 4.3 g/dL (ref 3.5–5.2)
Alkaline Phosphatase: 47 U/L (ref 39–117)
BUN: 19 mg/dL (ref 6–23)
CO2: 30 mEq/L (ref 19–32)
Calcium: 9.5 mg/dL (ref 8.4–10.5)
Chloride: 101 mEq/L (ref 96–112)
Creatinine, Ser: 0.87 mg/dL (ref 0.40–1.50)
GFR: 86.18 mL/min (ref >60.00)
Glucose, Bld: 84 mg/dL (ref 70–99)
Potassium: 4.6 mEq/L (ref 3.5–5.1)
Sodium: 137 mEq/L (ref 135–145)
Total Bilirubin: 1 mg/dL (ref 0.2–1.2)
Total Protein: 6.6 g/dL (ref 6.0–8.3)

## 2022-03-16 LAB — LIPID PANEL
Cholesterol: 183 mg/dL (ref 0–200)
HDL: 65.3 mg/dL (ref >39.00)
LDL Cholesterol: 106 mg/dL — ABNORMAL HIGH (ref 0–99)
NonHDL: 117.91
Total CHOL/HDL Ratio: 3
Triglycerides: 59 mg/dL (ref 0.0–149.0)
VLDL: 11.8 mg/dL (ref 0.0–40.0)

## 2022-03-16 LAB — CBC WITH DIFFERENTIAL/PLATELET
Basophils Absolute: 0 10*3/uL (ref 0.0–0.1)
Basophils Relative: 0.6 % (ref 0.0–3.0)
Eosinophils Absolute: 0.1 10*3/uL (ref 0.0–0.7)
Eosinophils Relative: 2.3 % (ref 0.0–5.0)
HCT: 44.5 % (ref 39.0–52.0)
Hemoglobin: 14.9 g/dL (ref 13.0–17.0)
Lymphocytes Relative: 25.5 % (ref 12.0–46.0)
Lymphs Abs: 1.2 10*3/uL (ref 0.7–4.0)
MCHC: 33.4 g/dL (ref 30.0–36.0)
MCV: 97.9 fl (ref 78.0–100.0)
Monocytes Absolute: 0.5 10*3/uL (ref 0.1–1.0)
Monocytes Relative: 11.1 % (ref 3.0–12.0)
Neutro Abs: 2.9 10*3/uL (ref 1.4–7.7)
Neutrophils Relative %: 60.5 % (ref 43.0–77.0)
Platelets: 125 10*3/uL — ABNORMAL LOW (ref 150.0–400.0)
RBC: 4.55 Mil/uL (ref 4.22–5.81)
RDW: 13.9 % (ref 11.5–15.5)
WBC: 4.8 10*3/uL (ref 4.0–10.5)

## 2022-03-16 LAB — TSH: TSH: 5.19 u[IU]/mL (ref 0.35–5.50)

## 2022-03-16 LAB — T4, FREE: Free T4: 0.75 ng/dL (ref 0.60–1.60)

## 2022-03-16 LAB — VITAMIN D 25 HYDROXY (VIT D DEFICIENCY, FRACTURES): VITD: 73.53 ng/mL (ref 30.00–100.00)

## 2022-03-16 LAB — PSA, MEDICARE: PSA: 1.66 ng/ml (ref 0.10–4.00)

## 2022-03-16 NOTE — Addendum Note (Signed)
Addended by: Ellamae Sia on: 03/16/2022 10:04 AM   Modules accepted: Orders

## 2022-03-19 ENCOUNTER — Encounter: Payer: Self-pay | Admitting: Family Medicine

## 2022-03-19 ENCOUNTER — Ambulatory Visit (INDEPENDENT_AMBULATORY_CARE_PROVIDER_SITE_OTHER): Payer: Medicare Other | Admitting: Family Medicine

## 2022-03-19 VITALS — BP 116/68 | HR 56 | Temp 97.5°F | Ht 71.5 in | Wt 149.2 lb

## 2022-03-19 DIAGNOSIS — I4891 Unspecified atrial fibrillation: Secondary | ICD-10-CM | POA: Diagnosis not present

## 2022-03-19 DIAGNOSIS — E559 Vitamin D deficiency, unspecified: Secondary | ICD-10-CM | POA: Diagnosis not present

## 2022-03-19 DIAGNOSIS — Z1589 Genetic susceptibility to other disease: Secondary | ICD-10-CM | POA: Diagnosis not present

## 2022-03-19 DIAGNOSIS — R7989 Other specified abnormal findings of blood chemistry: Secondary | ICD-10-CM

## 2022-03-19 DIAGNOSIS — N401 Enlarged prostate with lower urinary tract symptoms: Secondary | ICD-10-CM | POA: Diagnosis not present

## 2022-03-19 DIAGNOSIS — R350 Frequency of micturition: Secondary | ICD-10-CM

## 2022-03-19 DIAGNOSIS — Z125 Encounter for screening for malignant neoplasm of prostate: Secondary | ICD-10-CM | POA: Diagnosis not present

## 2022-03-19 DIAGNOSIS — D696 Thrombocytopenia, unspecified: Secondary | ICD-10-CM

## 2022-03-19 DIAGNOSIS — Z13220 Encounter for screening for lipoid disorders: Secondary | ICD-10-CM | POA: Diagnosis not present

## 2022-03-19 NOTE — Assessment & Plan Note (Signed)
Normal this time with nl FT4 No clinical changes  Will continue to follow

## 2022-03-19 NOTE — Assessment & Plan Note (Signed)
No voiding changes  Lab Results  Component Value Date   PSA 1.66 03/16/2022   PSA 1.91 02/12/2021   PSA 1.93 01/02/2020    Will continue to monitor

## 2022-03-19 NOTE — Assessment & Plan Note (Signed)
Continues flecainide and metoprolol Under cardiology care  Doing well

## 2022-03-19 NOTE — Assessment & Plan Note (Signed)
Continues in a holistic program with supplements and diet to prevent dementia

## 2022-03-19 NOTE — Progress Notes (Signed)
Subjective:    Patient ID: Jacob Marsh, male    DOB: [birth year], 71 y.o.   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  Is a lot to keep up with   Exercise- moderate / walks dog about an hour per day 2 miles/ [place] Light weights and stretching at lease 3 times per weeks   Working on sleep /doing better  Uses an app to monitor  An extra hour now per night   Eating better but still wants to improve   Immunization History  Administered Date(s) Administered   Fluad Quad(high Dose 65+) 08/08/2019   Influenza Split 08/17/2012   Influenza, High Dose Seasonal PF 09/10/2018   Influenza,inj,Quad PF,6+ Mos 12/11/2014, 09/10/2016, 09/14/2017   Influenza-Unspecified 09/09/2021   Moderna SARS-COV2 Booster Vaccination 06/06/2021   Moderna Sars-Covid-2 Vaccination 11/28/2019, 12/26/2019, 08/23/2020   Pneumococcal Conjugate-13 09/10/2016   Pneumococcal Polysaccharide-23 12/11/2014   Td 09/26/1999, 03/19/2009   Zoster status : interested in shingrix   Tetanus postponed for insurance   Has covid booster planned tomorrow   Colonoscopy 08/2015 with 7 y recall/would be due in November Some fam hx     01/16/2022    1:02 PM 01/15/2021    1:08 PM 01/02/2020   12:16 PM 01/03/2019   10:18 AM 09/14/2017    9:21 AM  Depression screen PHQ 2/9  Decreased Interest 0 0 0 0 0  Down, Depressed, Hopeless 0 0 0 0 0   PHQ - 2 Score 0 0 0 0 0  Altered sleeping  0 0  1  Tired, decreased energy  0 0  1  Change in appetite  0 0  0  Feeling bad or failure about yourself   0 0  0  Trouble concentrating  0 0  0  Moving slowly or fidgety/restless  0 0  0  Suicidal thoughts  0 0  0  PHQ-9 Score  0 0  2  Difficult doing work/chores  Not difficult at all Not difficult at all  Not difficult at all     Prostate health Lab Results  Component Value Date   PSA 1.66 03/16/2022   PSA 1.91 02/12/2021   PSA 1.93 01/02/2020   H/o BPH with mild symptoms  No change in urinary habits  Nocturia 1 at most   Has been in a program in [place] for APOE 3/4 genotype for alz risk   BP Readings from Last 3 Encounters:  03/19/22 116/68  02/18/21 106/64  02/14/21 124/62   Pulse Readings from Last 3 Encounters:  03/19/22 (!) 56  02/18/21 (!) 54  02/14/21 (!) 59    A fib history/had ablation  Sees Dr Lovena Le  Takes flecainaide and metoprolol  No issues recently and as long as he does what  he is supposed to do and gets enough sleep it help s   Past elevated TSH Lab Results  Component Value Date   TSH 5.19 03/16/2022   This was in the normal range  FT4 is nl at 0.75  Vit D def D level of 73.5  200 mcg daily    Lipid screen Lab Results  Component Value Date   CHOL 183 03/16/2022   CHOL 143 02/12/2021   CHOL 160 01/02/2020   Lab Results  Component Value Date   HDL 65.30 03/16/2022   HDL 56.50 02/12/2021   HDL 47.50 01/02/2020   Lab Results  Component Value Date   LDLCALC 106 (H) 03/16/2022   LDLCALC 74 02/12/2021   LDLCALC 98 01/02/2020   Lab Results  Component Value Date   TRIG 59.0 03/16/2022   TRIG 59.0 02/12/2021   TRIG 73.0 01/02/2020   Lab Results  Component Value Date   CHOLHDL 3 03/16/2022   CHOLHDL 3 02/12/2021   CHOLHDL 3 01/02/2020   No results found for: LDLDIRECT  Diet has been good overall  He is mindful  Had more shrimp than usual before that  Not enough time to prep  /plan but that will change   Trying to gain weight also  Will be starting a new meal program and this will be more convenient and less guess work  Anti alzheimer's diet  There will be more meat protein  Wild caught shrimp   Eats eggs   Platelet count was down Lab Results  Component Value Date   WBC 4.8 03/16/2022   HGB 14.9 03/16/2022   HCT 44.5 03/16/2022   MCV 97.9 03/16/2022   PLT 125.0 (L) 03/16/2022  No free bleeding or bruising   There was some hemolysis noted but K was nl  Lab Results  Component Value Date   CREATININE 0.87 03/16/2022   BUN 19 03/16/2022   NA 137 03/16/2022   K 4.6 hemolysis present 03/16/2022   CL 101 03/16/2022   CO2 30 03/16/2022   Lab Results  Component Value Date   ALT 18 03/16/2022   AST 27 03/16/2022   ALKPHOS 47 03/16/2022   BILITOT 1.0 03/16/2022    Supplements Melatonin  Saccharomyces  Charcoal Mag malate Mvi D3  Zinc  Patient Active Problem List   Diagnosis Date Noted   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   Vitamin D deficiency 02/18/2021   Elevated TSH 02/18/2021   A-fib ([place]) 10/17/2019   Atrial flutter ([place]) 10/17/2019   Dupuytren contracture 12/29/2017   Trigger finger of both hands    Hx of echocardiogram    Diverticulosis    Depression    BPH (benign prostatic hyperplasia) 09/15/2016   Need for hepatitis C screening test 09/07/2016   Encounter for Medicare annual wellness exam 12/11/2014   Screening for lipoid disorders 12/03/2014   Hoarse 07/17/2014   PVCs (premature ventricular contractions) 03/20/2014   Postural dizziness 03/20/2014   Palpitations 08/10/2013   History of chest pain 08/17/2012   Routine general medical examination at a health care facility 08/09/2012   Prostate cancer screening 08/09/2012   Thrombocytopenia ([place]) 07/18/2009   Past Medical History:  Diagnosis Date   A-fib ([place]) 10/17/2019   BPH (benign prostatic hyperplasia) 09/15/2016   Mild symptoms     Depression    Diverticulosis     History of diverticulosis// flex sig 2006//colonoscopy 01/21/2000 diverticulosis   Dupuytren contracture 12/29/2017   GERD (gastroesophageal reflux disease)    Globus sensation 07/17/2014  Hx of echocardiogram    Echo (9/14): EF 41%, grade 1 diastolic dysfunction, trivial AI, trivial MR, MAC, PASP 29   Palpitations 08/10/2013   Postural dizziness 03/20/2014   PVC (premature ventricular contraction)    PVCs (premature ventricular contractions) 03/20/2014   Trigger finger of both hands    Past Surgical History:  Procedure Laterality Date   A-FLUTTER ABLATION N/A 11/13/2019   Procedure: A-FLUTTER ABLATION;  Surgeon: Evans Lance, MD;  Location: [facility];  Service: Cardiovascular;  Laterality: N/A;   DUPUYTREN CONTRACTURE RELEASE     HAND SURGERY Right 08/2020   TONSILLECTOMY     WISDOM TOOTH EXTRACTION     Social History   Tobacco Use   Smoking status: Never   Smokeless tobacco: Never  Vaping Use   Vaping Use: Never used  Substance Use Topics   Alcohol use: Yes    Alcohol/week: 7.0 standard drinks    Types: 7 Glasses of wine per week    Comment: 2 oz every evening    Drug use: No   Family History  Problem Relation Age of Onset   Arthritis Mother    Heart disease Mother        ? atrial fib   Colon polyps Father    Depression Sister    Heart disease Maternal Grandmother        CAD   Cancer Maternal Grandmother        liver cancer   Heart disease Maternal Grandfather        CAD   Colon cancer Paternal Grandmother    Cancer - Colon Paternal Grandmother    Cancer Maternal Aunt        ?colon cancer   Colon cancer Paternal Uncle    Sudden death Neg Hx    No Known Allergies Current Outpatient Medications on File Prior to Visit  Medication Sig Dispense Refill   acetaminophen (TYLENOL) 500 MG tablet Take 1,000 mg by mouth every 6 (six) hours as needed for moderate pain or headache.     Charcoal Activated 280 MG CAPS Take 2 capsules by mouth daily.      Cholecalciferol (VITAMIN D-3 PO) Take 200 mcg by mouth daily.     flecainide (TAMBOCOR) 100 MG tablet TAKE 1 TABLET TWICE DAILY, MAY TAKE ADDITIONAL 1/2 TABLET DAILY FOR BREAKTHROUGH PALPITATIONS 225 tablet 2   ibuprofen (ADVIL) 200 MG tablet Take 400 mg by mouth every 6 (six) hours as needed for headache or moderate pain.     MAGNESIUM MALATE PO Take 360 mg by mouth in the morning and at bedtime.     Melatonin 1 MG CAPS Take 1.5 mg by mouth at bedtime.     metoprolol tartrate (LOPRESSOR) 25 MG tablet TAKE 1/2 TABLET BY MOUTH EVERY NIGHT AT BEDTIME. MAY TAKE AN ADDITIONAL 1/2 TABLET AS NEEDED FOR PALPITATION 90 tablet 2   Multiple Vitamin (MULTIVITAMIN WITH MINERALS) TABS Take 1 tablet by mouth daily.      OVER THE COUNTER MEDICATION Take 145 mg by mouth daily. NEUROMAG     OVER THE COUNTER MEDICATION Take 1 capsule by mouth daily. NEUROQ (Gotu Kolu 266m, Tumeric 250 mg, Ginkgo 120 mg, Phosphatidylserine 100 mg, Coffee Fruit extract less then 1 mg caffine, Propolis Extract 75 mg)     SACCHAROMYCES BOULARDII PO Take 420 mg by mouth daily.     Zinc 25 MG TABS Take 1 tablet by mouth daily.     No current facility-administered medications on file prior  to visit.    Review of Systems  Constitutional:  Negative for activity change, appetite change, fatigue, fever and unexpected weight change.  HENT:  Negative for congestion, rhinorrhea, sore throat and trouble swallowing.   Eyes:  Negative for pain, redness, itching and visual disturbance.  Respiratory:  Negative for cough, chest tightness, shortness of breath and wheezing.   Cardiovascular:  Negative for chest pain and palpitations.  Gastrointestinal:  Negative for abdominal pain, blood in stool, constipation, diarrhea and nausea.  Endocrine: Negative for cold intolerance, heat intolerance, polydipsia and polyuria.  Genitourinary:  Negative for difficulty urinating, dysuria, frequency and urgency.  Musculoskeletal:  Negative for arthralgias, joint  swelling and myalgias.  Skin:  Negative for pallor and rash.  Neurological:  Negative for dizziness, tremors, weakness, numbness and headaches.  Hematological:  Negative for adenopathy. Does not bruise/bleed easily.  Psychiatric/Behavioral:  Negative for decreased concentration and dysphoric mood. The patient is not nervous/anxious.        Trying to get stress level down       Objective:   Physical Exam Constitutional:      General: He is not in acute distress.    Appearance: Normal appearance. He is well-developed and normal weight. He is not ill-appearing or diaphoretic.  HENT:     Head: Normocephalic and atraumatic.     Right Ear: Tympanic membrane, ear canal and external ear normal.     Left Ear: Tympanic membrane, ear canal and external ear normal.     Nose: Nose normal. No congestion.     Mouth/Throat:     Mouth: Mucous membranes are moist.     Pharynx: Oropharynx is clear. No posterior oropharyngeal erythema.  Eyes:     General: No scleral icterus.       Right eye: No discharge.        Left eye: No discharge.     Conjunctiva/sclera: Conjunctivae normal.     Pupils: Pupils are equal, round, and reactive to light.  Neck:     Thyroid: No thyromegaly.     Vascular: No carotid bruit or JVD.  Cardiovascular:     Rate and Rhythm: Normal rate and regular rhythm.     Pulses: Normal pulses.     Heart sounds: Normal heart sounds.    No gallop.  Pulmonary:     Effort: Pulmonary effort is normal. No respiratory distress.     Breath sounds: Normal breath sounds. No wheezing or rales.     Comments: Good air exch Chest:     Chest wall: No tenderness.  Abdominal:     General: Bowel sounds are normal. There is no distension or abdominal bruit.     Palpations: Abdomen is soft. There is no mass.     Tenderness: There is no abdominal tenderness.     Hernia: No hernia is present.  Musculoskeletal:        General: No tenderness.     Cervical back: Normal range of motion and neck  supple. No rigidity. No muscular tenderness.     Right lower leg: No edema.     Left lower leg: No edema.     Comments: No kyphosis   Lymphadenopathy:     Cervical: No cervical adenopathy.  Skin:    General: Skin is warm and dry.     Coloration: Skin is not pale.     Findings: No erythema or rash.     Comments: Fair complexion  Some lentigines   Neurological:  Mental Status: He is alert.     Cranial Nerves: No cranial nerve deficit.     Motor: No abnormal muscle tone.     Coordination: Coordination normal.     Gait: Gait normal.     Deep Tendon Reflexes: Reflexes are normal and symmetric. Reflexes normal.  Psychiatric:        Mood and Affect: Mood normal.        Cognition and Memory: Cognition and memory normal.     Comments: Mentally sharp  Pleasant and talkative           Assessment & Plan:   Problem List Items Addressed This Visit       Cardiovascular and Mediastinum   A-fib ([place])    Continues flecainide and metoprolol Under cardiology care  Doing well         Genitourinary   BPH (benign prostatic hyperplasia)    No voiding changes  Lab Results  Component Value Date   PSA 1.66 03/16/2022   PSA 1.91 02/12/2021   PSA 1.93 01/02/2020    Will continue to monitor        Hematopoietic and Hemostatic   Thrombocytopenia ([facility])    Last platelet ct 125 No bleeding or bruising  Will continue to monitor           Other   APOE*3/*4 genotype    Continues in a holistic program with supplements and diet to prevent dementia        Elevated TSH    Normal this time with nl FT4 No clinical changes  Will continue to follow        Prostate cancer screening    Lab Results  Component Value Date   PSA 1.66 03/16/2022   PSA 1.91 02/12/2021   PSA 1.93 01/02/2020   No change in urination       Screening for lipoid disorders - Primary    Disc goals for lipids and reasons to control them Rev last labs with pt Rev low sat fat diet in detail LDL  and HDL are both up   Has had more red meat and shrimp in diet lately      Vitamin D deficiency    Therapeutic at 73.9  Plans to continue current D3 supplementation  200 mcg daily

## 2022-03-19 NOTE — Assessment & Plan Note (Signed)
Last platelet ct 125 No bleeding or bruising  Will continue to monitor

## 2022-03-19 NOTE — Patient Instructions (Addendum)
Get your covid booster as planned    Wait a month after that  to get the shingrix vaccine  If you are interested in the new shingles vaccine (Shingrix) - call your local pharmacy to check on coverage and availability  If affordable, get on a wait list at your pharmacy to get the vaccine.  Your colonoscopy will be due for 7 year recall in November  When you are ready closer to then, let us know if you need a referral   Keep taking good care of yourself

## 2022-03-19 NOTE — Assessment & Plan Note (Signed)
Disc goals for lipids and reasons to control them Rev last labs with pt Rev low sat fat diet in detail LDL and HDL are both up   Has had more red meat and shrimp in diet lately

## 2022-03-19 NOTE — Assessment & Plan Note (Signed)
Therapeutic at 73.9  Plans to continue current D3 supplementation  200 mcg daily

## 2022-03-19 NOTE — Assessment & Plan Note (Signed)
Lab Results  Component Value Date   PSA 1.66 03/16/2022   PSA 1.91 02/12/2021   PSA 1.93 01/02/2020    No change in urination

## 2022-03-20 DIAGNOSIS — Z23 Encounter for immunization: Secondary | ICD-10-CM | POA: Diagnosis not present

## 2022-05-13 ENCOUNTER — Encounter: Payer: Self-pay | Admitting: Gastroenterology

## 2022-06-12 DIAGNOSIS — R634 Abnormal weight loss: Secondary | ICD-10-CM | POA: Diagnosis not present

## 2022-06-12 DIAGNOSIS — E039 Hypothyroidism, unspecified: Secondary | ICD-10-CM | POA: Diagnosis not present

## 2022-06-12 DIAGNOSIS — R0683 Snoring: Secondary | ICD-10-CM | POA: Diagnosis not present

## 2022-06-12 DIAGNOSIS — E60 Dietary zinc deficiency: Secondary | ICD-10-CM | POA: Diagnosis not present

## 2022-06-12 DIAGNOSIS — I4891 Unspecified atrial fibrillation: Secondary | ICD-10-CM | POA: Diagnosis not present

## 2022-06-12 DIAGNOSIS — E559 Vitamin D deficiency, unspecified: Secondary | ICD-10-CM | POA: Diagnosis not present

## 2022-06-12 DIAGNOSIS — I10 Essential (primary) hypertension: Secondary | ICD-10-CM | POA: Diagnosis not present

## 2022-06-12 DIAGNOSIS — R5383 Other fatigue: Secondary | ICD-10-CM | POA: Diagnosis not present

## 2022-06-19 ENCOUNTER — Ambulatory Visit (INDEPENDENT_AMBULATORY_CARE_PROVIDER_SITE_OTHER): Payer: Medicare Other | Admitting: Gastroenterology

## 2022-06-19 ENCOUNTER — Encounter: Payer: Self-pay | Admitting: Gastroenterology

## 2022-06-19 VITALS — BP 126/78 | HR 66 | Ht 71.0 in | Wt 155.1 lb

## 2022-06-19 DIAGNOSIS — E60 Dietary zinc deficiency: Secondary | ICD-10-CM | POA: Diagnosis not present

## 2022-06-19 DIAGNOSIS — R0683 Snoring: Secondary | ICD-10-CM | POA: Diagnosis not present

## 2022-06-19 DIAGNOSIS — R5383 Other fatigue: Secondary | ICD-10-CM | POA: Diagnosis not present

## 2022-06-19 DIAGNOSIS — I4891 Unspecified atrial fibrillation: Secondary | ICD-10-CM | POA: Diagnosis not present

## 2022-06-19 DIAGNOSIS — Z1211 Encounter for screening for malignant neoplasm of colon: Secondary | ICD-10-CM

## 2022-06-19 DIAGNOSIS — E559 Vitamin D deficiency, unspecified: Secondary | ICD-10-CM | POA: Diagnosis not present

## 2022-06-19 DIAGNOSIS — Z800 Family history of malignant neoplasm of digestive organs: Secondary | ICD-10-CM

## 2022-06-19 DIAGNOSIS — E039 Hypothyroidism, unspecified: Secondary | ICD-10-CM | POA: Diagnosis not present

## 2022-06-19 DIAGNOSIS — I10 Essential (primary) hypertension: Secondary | ICD-10-CM | POA: Diagnosis not present

## 2022-06-19 DIAGNOSIS — R634 Abnormal weight loss: Secondary | ICD-10-CM | POA: Diagnosis not present

## 2022-06-19 NOTE — Progress Notes (Signed)
06/19/2022 Jacob Marsh [id number] 19-Sep-1951   HISTORY OF PRESENT ILLNESS: This is a 71 year old male with a patient of Dr. Lynne Leader.  He is here today to discuss possible colonoscopy.  He denies any GI complaints.  He says moves his bowels very regularly.  No rectal bleeding.  He personally has had one 5 mm tubular adenoma since 2001, which has been 3 colonoscopies, 2 colonoscopies without polyps.  Family history of colon polyps in his father, but his paternal grandmother and 2 of her siblings had colon cancer.  His last colonoscopy was in 2016 at which time that study was normal.  Although he technically was probably not a candidate for Cologuard due to his history of a single polyp, that was ordered last year by his PCP and was negative.  His hemoglobin is normal.  Past Medical History:  Diagnosis Date   A-fib ([place]) 10/17/2019   BPH (benign prostatic hyperplasia) 09/15/2016   Mild symptoms     Depression    Diverticulosis    History of diverticulosis// flex sig 2006//colonoscopy 01/21/2000 diverticulosis   Dupuytren contracture 12/29/2017   GERD (gastroesophageal reflux disease)    Globus sensation 07/17/2014   Hx of echocardiogram    Echo (9/14): EF 75%, grade 1 diastolic dysfunction, trivial AI, trivial MR, MAC, PASP 29   Palpitations 08/10/2013   Postural dizziness 03/20/2014   PVC (premature ventricular contraction)    PVCs (premature ventricular contractions) 03/20/2014   Trigger finger of both hands    Past Surgical History:  Procedure Laterality Date   A-FLUTTER ABLATION N/A 11/13/2019   Procedure: A-FLUTTER ABLATION;  Surgeon: Evans Lance, MD;  Location: [facility];  Service: Cardiovascular;  Laterality: N/A;   DUPUYTREN CONTRACTURE RELEASE     HAND SURGERY Right 08/2020   TONSILLECTOMY     WISDOM TOOTH EXTRACTION      reports that he has never smoked. He has never used smokeless tobacco. He reports current alcohol use of about 1.0 standard drink of  alcohol per week. He reports that he does not use drugs. family history includes Arthritis in his mother; Cancer in his maternal aunt and maternal grandmother; Cancer - Colon in his paternal grandmother; Colon cancer in his paternal grandmother and paternal uncle; Colon polyps in his father; Depression in his sister; Heart disease in his maternal grandfather, maternal grandmother, and mother. No Known Allergies    Outpatient Encounter Medications as of 06/19/2022  Medication Sig   acetaminophen (TYLENOL) 500 MG tablet Take 1,000 mg by mouth every 6 (six) hours as needed for moderate pain or headache.   Charcoal Activated 280 MG CAPS Take 2 capsules by mouth daily.   Cholecalciferol (VITAMIN D-3 PO) Take 200 mcg by mouth daily.   flecainide (TAMBOCOR) 100 MG tablet TAKE 1 TABLET TWICE DAILY, MAY TAKE ADDITIONAL 1/2 TABLET DAILY FOR BREAKTHROUGH PALPITATIONS   ibuprofen (ADVIL) 200 MG tablet Take 400 mg by mouth every 6 (six) hours as needed for headache or moderate pain.   MAGNESIUM MALATE PO Take 360 mg by mouth in the morning and at bedtime.   Melatonin 1 MG CAPS Take 1.5 mg by mouth at bedtime.   metoprolol tartrate (LOPRESSOR) 25 MG tablet TAKE 1/2 TABLET BY MOUTH EVERY NIGHT AT BEDTIME. MAY TAKE AN ADDITIONAL 1/2 TABLET AS NEEDED FOR PALPITATION   Multiple Vitamin (MULTIVITAMIN WITH MINERALS) TABS Take 1 tablet by mouth daily.    OVER THE COUNTER MEDICATION Take 145 mg by mouth daily. NEUROMAG  OVER THE COUNTER MEDICATION Take 1 capsule by mouth daily. NEUROQ (Gotu Kolu 256m, Tumeric 250 mg, Ginkgo 120 mg, Phosphatidylserine 100 mg, Coffee Fruit extract less then 1 mg caffine, Propolis Extract 75 mg)   SACCHAROMYCES BOULARDII PO Take 420 mg by mouth daily.   Zinc 25 MG TABS Take 1 tablet by mouth daily.   No facility-administered encounter medications on file as of 06/19/2022.    REVIEW OF SYSTEMS  : All other systems reviewed and negative except where noted in the History of Present  Illness.   PHYSICAL EXAM: BP 126/78   Pulse 66   Ht _0  (1.803 m)   Wt 155 lb 2 oz (70.4 kg)   SpO2 97%   BMI 21.64 kg/m  General: Well developed white male in no acute distress Head: Normocephalic and atraumatic Eyes:  Sclerae anicteric, conjunctiva pink. Ears: Normal auditory acuity Lungs: Clear throughout to auscultation; no W/R/R. Heart: Regular rate and rhythm; no M/R/G. Abdomen: Soft, non-distended.  BS present.  Non-tender. Musculoskeletal: Symmetrical with no gross deformities  Skin: No lesions on visible extremities Extremities: No edema  Neurological: Alert oriented x 4, grossly non-focal Psychological:  Alert and cooperative. Normal mood and affect  ASSESSMENT AND PLAN: *71year old male wanting to discuss need for colonoscopy.  He personally has had one 5 mm tubular adenoma since 2001, which has been 3 colonoscopies, 2 colonoscopies without polyps.  Family history of colon polyps in his father, but his paternal grandmother and 2 of her siblings had colon cancer.  His last colonoscopy was in 2016 at which time that study was normal.  Although he technically was probably not a candidate for Cologuard due to his history of a single polyp, that was ordered last year by his PCP and was negative.  That being said, he was entered in for a 10-year colonoscopy recall which would be 2026.  We discussed that even though that technically should have not been ordered it did result as negative and is a screening type study so would clear him for 3 years.  We talked about repeating colonoscopy in 2 more years from now.  He was agreeable with that.  He has absolutely no GI complaints.  He will return in the interim if any other issues were to arise.  CC:  Tower, MWynelle Fanny MD

## 2022-06-19 NOTE — Patient Instructions (Signed)
Follow up in 2 years or sooner if needed for repeat colonoscopy.   _______________________________________________________  If you are age 73 or older, your body mass index should be between 23-30. Your Body mass index is 21.64 kg/m. If this is out of the aforementioned range listed, please consider follow up with your Primary Care Provider.  If you are age 39 or younger, your body mass index should be between 19-25. Your Body mass index is 21.64 kg/m. If this is out of the aformentioned range listed, please consider follow up with your Primary Care Provider.   ________________________________________________________  The [facility] providers would like to encourage you to use [facility] to communicate with providers for non-urgent requests or questions.  Due to long hold times on the telephone, sending your provider a message by [facility] may be a faster and more efficient way to get a response.  Please allow 48 business hours for a response.  Please remember that this is for non-urgent requests.  _______________________________________________________

## 2022-07-21 DIAGNOSIS — R208 Other disturbances of skin sensation: Secondary | ICD-10-CM | POA: Diagnosis not present

## 2022-07-21 DIAGNOSIS — L578 Other skin changes due to chronic exposure to nonionizing radiation: Secondary | ICD-10-CM | POA: Diagnosis not present

## 2022-07-21 DIAGNOSIS — H61031 Chondritis of right external ear: Secondary | ICD-10-CM | POA: Diagnosis not present

## 2022-07-21 DIAGNOSIS — L538 Other specified erythematous conditions: Secondary | ICD-10-CM | POA: Diagnosis not present

## 2022-07-21 DIAGNOSIS — L570 Actinic keratosis: Secondary | ICD-10-CM | POA: Diagnosis not present

## 2022-08-10 ENCOUNTER — Encounter: Payer: Self-pay | Admitting: Family Medicine

## 2022-08-14 DIAGNOSIS — Z23 Encounter for immunization: Secondary | ICD-10-CM | POA: Diagnosis not present

## 2022-08-25 ENCOUNTER — Encounter: Payer: Self-pay | Admitting: Internal Medicine

## 2022-08-25 ENCOUNTER — Ambulatory Visit: Payer: Medicare Other | Attending: Internal Medicine | Admitting: Internal Medicine

## 2022-08-25 VITALS — BP 116/70 | HR 59 | Ht 71.0 in | Wt 153.2 lb

## 2022-08-25 DIAGNOSIS — I493 Ventricular premature depolarization: Secondary | ICD-10-CM | POA: Diagnosis not present

## 2022-08-25 DIAGNOSIS — I4891 Unspecified atrial fibrillation: Secondary | ICD-10-CM

## 2022-08-25 DIAGNOSIS — I4892 Unspecified atrial flutter: Secondary | ICD-10-CM

## 2022-08-25 NOTE — Progress Notes (Signed)
HPI Jacob Marsh returns today for followup of his PVC's and atrial fib and flutter. He is a pleasant 72 yo man with a h/o the above symptoms. He has a h/o persistent atrial flutter and is s/p flutter ablation almost 3 years ago. During his ablation he has some brief atrial fib of uncertain clinical significance. He has not had any symptoms. He has been maintained on a combination of a beta blocker and flecainide.  He saw me last about 18 months ago and at that time was concerned about the possibility of developing dementia. In the interim, he notes no symptoms of dementia and he overall feels quite well.   No Known Allergies   Current Outpatient Medications  Medication Sig Dispense Refill   acetaminophen (TYLENOL) 500 MG tablet Take 1,000 mg by mouth every 6 (six) hours as needed for moderate pain or headache.     Charcoal Activated 280 MG CAPS Take 2 capsules by mouth daily.     Cholecalciferol (VITAMIN D-3 PO) Take 200 mcg by mouth daily.     flecainide (TAMBOCOR) 100 MG tablet TAKE 1 TABLET TWICE DAILY, MAY TAKE ADDITIONAL 1/2 TABLET DAILY FOR BREAKTHROUGH PALPITATIONS 225 tablet 2   ibuprofen (ADVIL) 200 MG tablet Take 400 mg by mouth every 6 (six) hours as needed for headache or moderate pain.     MAGNESIUM MALATE PO Take 360 mg by mouth in the morning and at bedtime.     Melatonin 1 MG CAPS Take 1.5 mg by mouth at bedtime.     metoprolol tartrate (LOPRESSOR) 25 MG tablet TAKE 1/2 TABLET BY MOUTH EVERY NIGHT AT BEDTIME. MAY TAKE AN ADDITIONAL 1/2 TABLET AS NEEDED FOR PALPITATION 90 tablet 2   Multiple Vitamin (MULTIVITAMIN WITH MINERALS) TABS Take 1 tablet by mouth daily.      OVER THE COUNTER MEDICATION Take 145 mg by mouth daily. NEUROMAG     OVER THE COUNTER MEDICATION Take 1 capsule by mouth daily. NEUROQ (Gotu Kolu 221m, Tumeric 250 mg, Ginkgo 120 mg, Phosphatidylserine 100 mg, Coffee Fruit extract less then 1 mg caffine, Propolis Extract 75 mg)     SACCHAROMYCES BOULARDII PO  Take 420 mg by mouth daily.     Zinc 25 MG TABS Take 1 tablet by mouth daily.     No current facility-administered medications for this visit.     Past Medical History:  Diagnosis Date   A-fib ([place] 10/17/2019   BPH (benign prostatic hyperplasia) 09/15/2016   Mild symptoms     Depression    Diverticulosis    History of diverticulosis// flex sig 2006//colonoscopy 01/21/2000 diverticulosis   Dupuytren contracture 12/29/2017   GERD (gastroesophageal reflux disease)    Globus sensation 07/17/2014   Hx of echocardiogram    Echo (9/14): EF 567% grade 1 diastolic dysfunction, trivial AI, trivial MR, MAC, PASP 29   Palpitations 08/10/2013   Postural dizziness 03/20/2014   PVC (premature ventricular contraction)    PVCs (premature ventricular contractions) 03/20/2014   Trigger finger of both hands     ROS:   All systems reviewed and negative except as noted in the HPI.   Past Surgical History:  Procedure Laterality Date   A-FLUTTER ABLATION N/A 11/13/2019   Procedure: A-FLUTTER ABLATION;  Surgeon: TEvans Lance MD;  [facility];  Service: Cardiovascular;  Laterality: N/A;   DUPUYTREN CONTRACTURE RELEASE     HAND SURGERY Right 08/2020   TONSILLECTOMY     WISDOM TOOTH EXTRACTION  Family History  Problem Relation Age of Onset   Arthritis Mother    Heart disease Mother        ? atrial fib   Colon polyps Father    Depression Sister    Heart disease Maternal Grandmother        CAD   Cancer Maternal Grandmother        liver cancer   Heart disease Maternal Grandfather        CAD   Colon cancer Paternal Grandmother    Cancer - Colon Paternal Grandmother    Cancer Maternal Aunt        ?colon cancer   Colon cancer Paternal Uncle    Sudden death Neg Hx      Social History   Socioeconomic History   Marital status: Married    Spouse name: Not on file   Number of children: 3   Years of education: Not on file   Highest education level: Not on file   Occupational History   Occupation: works from home  Tobacco Use   Smoking status: Never   Smokeless tobacco: Never  Vaping Use   Vaping Use: Never used  Substance and Sexual Activity   Alcohol use: Yes    Alcohol/week: 1.0 standard drink of alcohol    Types: 1 Glasses of wine per week    Comment: occ   Drug use: No   Sexual activity: Yes  Other Topics Concern   Not on file  Social History Narrative   Wife has alzheimer's and is living in a SNF since 2020   Still working as well    Children live in [place], [place], [place], [place], and [place], but they visit each other several times per year   He's close with his sister who lives nearby   Social Determinants of Health   Financial Resource Strain: [place]  (01/16/2022)   Overall Financial Resource Strain (CARDIA)    Difficulty of Paying Living Expenses: Not hard at all  Food Insecurity: No Food Insecurity (01/16/2022)   Hunger Vital Sign    Worried About Running Out of Food in the Last Year: Never true    [place] in the Last Year: Never true  Transportation Needs: No Transportation Needs (01/16/2022)   PRAPARE - Hydrologist (Medical): No    Lack of Transportation (Non-Medical): No  Physical Activity: Sufficiently Active (01/16/2022)   Exercise Vital Sign    Days of Exercise per Week: 7 days    Minutes of Exercise per Session: 60 min  Stress: No Stress Concern Present (01/16/2022)   [place]    Feeling of Stress : Not at all  Social Connections: Moderately Integrated (01/16/2022)   Social Connection and Isolation Panel [NHANES]    Frequency of Communication with Friends and Family: More than three times a week    Frequency of Social Gatherings with Friends and Family: More than three times a week    Attends Religious Services: Never    Marine scientist or Organizations: Yes    Attends Music therapist: More  than 4 times per year    Marital Status: Married  Human resources officer Violence: Not At Risk (01/16/2022)   Humiliation, Afraid, Rape, and Kick questionnaire    Fear of Current or Ex-Partner: No    Emotionally Abused: No    Physically Abused: No    Sexually Abused: No  BP 116/70   Pulse (!) 59   Ht _0  (1.803 m)   Wt 153 lb 3.2 oz (69.5 kg)   SpO2 99%   BMI 21.37 kg/m   Physical Exam:  Well appearing NAD HEENT: Unremarkable Neck:  No JVD, no thyromegally Lymphatics:  No adenopathy Back:  No CVA tenderness Lungs:  Clear HEART:  Regular rate rhythm, no murmurs, no rubs, no clicks Abd:  soft, positive bowel sounds, no organomegally, no rebound, no guarding Ext:  2 plus pulses, no edema, no cyanosis, no clubbing Skin:  No rashes no nodules Neuro:  CN II through XII intact, motor grossly intact  EKG - sinus brady  Assess/Plan:  Atrial flutter - he has not had any symptoms since his ablation.  PVC's - these are well controlled on flecainide. Atrial fib- he has not had any since his ablation of atrial flutter. His CHADSVASC score is 1. When he turns 73 I would anticipate starting an [place]. Dizziness - this appears to be stable. No change in meds.  Jacob Overlie Nobel Brar,MD

## 2022-08-25 NOTE — Patient Instructions (Signed)

## 2022-09-11 DIAGNOSIS — Z23 Encounter for immunization: Secondary | ICD-10-CM | POA: Diagnosis not present

## 2022-10-17 ENCOUNTER — Other Ambulatory Visit: Payer: Self-pay | Admitting: Internal Medicine

## 2022-10-20 ENCOUNTER — Other Ambulatory Visit: Payer: Self-pay

## 2022-10-20 MED ORDER — METOPROLOL TARTRATE 25 MG PO TABS: ORAL_TABLET | ORAL | 3 refills | Status: DC

## 2022-11-19 ENCOUNTER — Encounter: Payer: Self-pay | Admitting: Family Medicine

## 2022-11-20 NOTE — Telephone Encounter (Signed)
Form printed and placed in your in box

## 2022-11-22 NOTE — Telephone Encounter (Signed)
Form done and going into IN box

## 2023-01-18 ENCOUNTER — Ambulatory Visit (INDEPENDENT_AMBULATORY_CARE_PROVIDER_SITE_OTHER): Payer: Medicare Other

## 2023-01-18 VITALS — Ht 71.5 in | Wt 152.0 lb

## 2023-01-18 DIAGNOSIS — Z1211 Encounter for screening for malignant neoplasm of colon: Secondary | ICD-10-CM

## 2023-01-18 DIAGNOSIS — Z0000 Encounter for general adult medical examination without abnormal findings: Secondary | ICD-10-CM | POA: Diagnosis not present

## 2023-01-18 NOTE — Progress Notes (Signed)
I connected with  [name] on 01/18/23 by a audio enabled telemedicine application and verified that I am speaking with the correct person using two identifiers.  Patient Location: Home  Provider Location: Office/Clinic  I discussed the limitations of evaluation and management by telemedicine. The patient expressed understanding and agreed to proceed.  Subjective:   Jacob Marsh is a 72 y.o. male who presents for Medicare Annual/Subsequent preventive examination.  Review of Systems      Cardiac Risk Factors include: advanced age (>73men, >54 women);male gender     Objective:    Today's Vitals   01/18/23 1050  Weight: 152 lb (68.9 kg)  Height: 5' 11.5" (1.816 m)   Body mass index is 20.9 kg/m.     01/18/2023   11:04 AM 01/16/2022    1:03 PM 01/15/2021    1:06 PM 01/02/2020   12:13 PM 11/13/2019    6:16 AM 09/14/2017    9:22 AM 09/10/2016    9:19 AM  Advanced Directives  Does Patient Have a Medical Advance Directive? Yes Yes Yes Yes Yes Yes Yes  Type of Paramedic of [place];Living will [place];Living will [place];Living will [place];Living will [place];Living will [place];Living will [place];Living will  Does patient want to make changes to medical advance directive?     No - Patient declined  No - Patient declined  Copy of [place] in Chart? No - copy requested No - copy requested No - copy requested No - copy requested No - copy requested No - copy requested No - copy requested    Current Medications (verified) Outpatient Encounter Medications as of 01/18/2023  Medication Sig   acetaminophen (TYLENOL) 500 MG tablet Take 1,000 mg by mouth every 6 (six) hours as needed for moderate pain or headache.   Charcoal Activated 280 MG CAPS Take 2 capsules by mouth daily.   Cholecalciferol (VITAMIN D-3 PO)  Take 200 mcg by mouth daily.   flecainide (TAMBOCOR) 100 MG tablet TAKE 1 TABLET BY MOUTH TWICE DAILY, MAY TAKE ADDITIONAL 1/2 TABLET DAILY FOR BREAKTHROUGH PALPITATIONS   ibuprofen (ADVIL) 200 MG tablet Take 400 mg by mouth every 6 (six) hours as needed for headache or moderate pain.   MAGNESIUM MALATE PO Take 360 mg by mouth in the morning and at bedtime.   Melatonin 1 MG CAPS Take 1.5 mg by mouth at bedtime.   metoprolol tartrate (LOPRESSOR) 25 MG tablet TAKE 1/2 TABLET BY MOUTH EVERY NIGHT AT BEDTIME. MAY TAKE AN ADDITIONAL 1/2 TABLET AS NEEDED FOR PALPITATION   Multiple Vitamin (MULTIVITAMIN WITH MINERALS) TABS Take 1 tablet by mouth daily.    OVER THE COUNTER MEDICATION Take 145 mg by mouth daily. NEUROMAG   SACCHAROMYCES BOULARDII PO Take 420 mg by mouth daily.   Zinc 25 MG TABS Take 1 tablet by mouth daily.   OVER THE COUNTER MEDICATION Take 1 capsule by mouth daily. NEUROQ (Gotu Kolu 250mg , Tumeric 250 mg, Ginkgo 120 mg, Phosphatidylserine 100 mg, Coffee Fruit extract less then 1 mg caffine, Propolis Extract 75 mg) (Patient not taking: Reported on 01/18/2023)   No facility-administered encounter medications on file as of 01/18/2023.    Allergies (verified) Patient has no known allergies.   History: Past Medical History:  Diagnosis Date   A-fib ([place]) 10/17/2019   BPH (benign prostatic hyperplasia) 09/15/2016   Mild symptoms  Depression    Diverticulosis    History of diverticulosis// flex sig 2006//colonoscopy 01/21/2000 diverticulosis   Dupuytren contracture 12/29/2017   GERD (gastroesophageal reflux disease)    Globus sensation 07/17/2014   Hx of echocardiogram    Echo (9/14): EF [id number], grade 1 diastolic dysfunction, trivial AI, trivial MR, MAC, PASP 29   Palpitations 08/10/2013   Postural dizziness 03/20/2014   PVC (premature ventricular contraction)    PVCs (premature ventricular contractions) 03/20/2014   Trigger finger of both hands    Past Surgical History:  Procedure  Laterality Date   A-FLUTTER ABLATION N/A 11/13/2019   Procedure: A-FLUTTER ABLATION;  Surgeon: Evans Lance, MD;  Location: [facility];  Service: Cardiovascular;  Laterality: N/A;   DUPUYTREN CONTRACTURE RELEASE     HAND SURGERY Right 08/2020   TONSILLECTOMY     WISDOM TOOTH EXTRACTION     Family History  Problem Relation Age of Onset   Arthritis Mother    Heart disease Mother        ? atrial fib   Colon polyps Father    Depression Sister    Heart disease Maternal Grandmother        CAD   Cancer Maternal Grandmother        liver cancer   Heart disease Maternal Grandfather        CAD   Colon cancer Paternal Grandmother    Cancer - Colon Paternal Grandmother    Cancer Maternal Aunt        ?colon cancer   Colon cancer Paternal Uncle    Sudden death Neg Hx    Social History   Socioeconomic History   Marital status: Widowed    Spouse name: Not on file   Number of children: 3   Years of education: Not on file   Highest education level: Not on file  Occupational History   Occupation: works from home  Tobacco Use   Smoking status: Never   Smokeless tobacco: Never  Vaping Use   Vaping Use: Never used  Substance and Sexual Activity   Alcohol use: Yes    Alcohol/week: 1.0 standard drink of alcohol    Types: 1 Glasses of wine per week    Comment: occ   Drug use: No   Sexual activity: Yes  Other Topics Concern   Not on file  Social History Narrative   Wife has alzheimer's and is living in a SNF since 2020   Still working as well    Children live in [place], [place], [place], [place], and [place], but they visit each other several times per year   He's close with his sister who lives nearby   Social Determinants of Health   Financial Resource Strain: [place]  (01/16/2022)   Overall Financial Resource Strain (CARDIA)    Difficulty of Paying Living Expenses: Not hard at all  Food Insecurity: No Food Insecurity (01/18/2023)   Hunger Vital Sign    Worried About Running  Out of Food in the Last Year: Never true    [place] in the Last Year: Never true  Transportation Needs: No Transportation Needs (01/18/2023)   PRAPARE - Hydrologist (Medical): No    Lack of Transportation (Non-Medical): No  Physical Activity: Sufficiently Active (01/18/2023)   Exercise Vital Sign    Days of Exercise per Week: 3 days    Minutes of Exercise per Session: 60 min  Stress: No Stress Concern Present (01/16/2022)  [employer] - Occupational Stress Questionnaire    Feeling of Stress : Not at all  Social Connections: Moderately Isolated (01/18/2023)   Social Connection and Isolation Panel [NHANES]    Frequency of Communication with Friends and Family: More than three times a week    Frequency of Social Gatherings with Friends and Family: Three times a week    Attends Religious Services: Never    Active Member of Clubs or Organizations: Yes    Attends Archivist Meetings: More than 4 times per year    Marital Status: Widowed    Tobacco Counseling Counseling given: Not Answered   Clinical Intake:  Pre-visit preparation completed: Yes  Pain : No/denies pain     Nutritional Risks: None Diabetes: No  How often do you need to have someone help you when you read instructions, pamphlets, or other written materials from your doctor or pharmacy?: 1 - Never  Diabetic? no  Interpreter Needed?: No  Information entered by :: C.Fiore Detjen LPN   Activities of Daily Living    01/18/2023   11:05 AM  In your present state of health, do you have any difficulty performing the following activities:  Hearing? 0  Vision? 0  Difficulty concentrating or making decisions? 0  Walking or climbing stairs? 0  Dressing or bathing? 0  Doing errands, shopping? 0  Preparing Food and eating ? N  Using the Toilet? N  In the past six months, have you accidently leaked urine? Y  Comment occasional not bothersome  Do  you have problems with loss of bowel control? N  Managing your Medications? N  Managing your Finances? N  Housekeeping or managing your Housekeeping? N    Patient Care Team: Tower, Wynelle Fanny, MD as PCP - General Lovena Le Champ Mungo, MD as PCP - Cardiology (Cardiology) Evans Lance, MD as PCP - Electrophysiology (Cardiology) Josue Hector, MD as Consulting Physician (Cardiology) Evans Lance, MD as Consulting Physician (Cardiology) Jola Schmidt, MD as Consulting Physician (Ophthalmology) Druscilla Brownie, MD as Consulting Physician (Dermatology) Vinnie Level, DDS, PA as Referring Physician (Dentistry)  Indicate any recent Medical Services you may have received from other than [facility] providers in the past year (date may be approximate).     Assessment:   This is a routine wellness examination for Jacob Marsh.  Hearing/Vision screen Hearing Screening - Comments:: No aid Vision Screening - Comments:: Readers - [facility]  Dietary issues and exercise activities discussed: Current Exercise Habits: Home exercise routine, Type of exercise: stretching;strength training/weights;walking, Time (Minutes): 60, Frequency (Times/Week): 3, Weekly Exercise (Minutes/Week): 180, Intensity: Mild, Exercise limited by: None identified   Goals Addressed             This Visit's Progress    Patient Stated       Gain some weight and stay active.       Depression Screen    01/18/2023   11:03 AM 01/16/2022    1:02 PM 01/15/2021    1:08 PM 01/02/2020   12:16 PM 01/03/2019   10:18 AM 09/14/2017    9:21 AM 09/10/2016    9:19 AM  PHQ 2/9 Scores  PHQ - 2 Score 0 0 0 0 0 0 0  PHQ- 9 Score   0 0  2     Fall Risk    01/18/2023   10:56 AM 01/16/2022   12:59 PM 01/15/2021    1:08 PM 01/02/2020   12:14 PM 01/03/2019   10:18 AM  Fall Risk   Falls in the past year? 1 0 0 1 1  Comment Dog pushed patient over   slipped on ice   Number falls in past yr: 0 0 0 0 0  Injury with Fall? 0 0 0 0 0  Risk for  fall due to : No Fall Risks Orthopedic patient Medication side effect Medication side effect   Follow up Falls prevention discussed;Falls evaluation completed Falls prevention discussed Falls evaluation completed;Falls prevention discussed Falls evaluation completed;Falls prevention discussed     FALL RISK PREVENTION PERTAINING TO THE HOME:  Any stairs in or around the home? No  If so, are there any without handrails? No  Home free of loose throw rugs in walkways, pet beds, electrical cords, etc? Yes  Adequate lighting in your home to reduce risk of falls? Yes   ASSISTIVE DEVICES UTILIZED TO PREVENT FALLS:  Life alert? No  Use of a cane, walker or w/c? No  Grab bars in the bathroom? Yes  Shower chair or bench in shower? No  Elevated toilet seat or a handicapped toilet? No    Cognitive Function:    01/15/2021    1:10 PM 01/02/2020   12:19 PM 09/14/2017    9:25 AM 09/10/2016    9:30 AM  MMSE - Mini Mental State Exam  Orientation to time 5 5 5 5   Orientation to Place 5 5 5 5   Registration 3 3 3 3   Attention/ Calculation 5 5 0 0  Recall 3 3 3 3   Language- name 2 objects   0 0  Language- repeat 1 1 1 1   Language- follow 3 step command   3 3  Language- read & follow direction   0 0  Write a sentence   0 0  Copy design   0 0  Total score   20 20        01/18/2023   11:06 AM  6CIT Screen  What Year? 0 points  What month? 0 points  What time? 0 points  Count back from 20 0 points  Months in reverse 0 points  Repeat phrase 0 points  Total Score 0 points    Immunizations Immunization History  Administered Date(s) Administered   Fluad Quad(high Dose 65+) 08/08/2019   Influenza Split 08/17/2012   Influenza, High Dose Seasonal PF 09/10/2018   Influenza,inj,Quad PF,6+ Mos 12/11/2014, 09/10/2016, 09/14/2017   Influenza-Unspecified 09/09/2021   Moderna SARS-COV2 Booster Vaccination 06/06/2021   Moderna Sars-Covid-2 Vaccination 11/28/2019, 12/26/2019, 08/23/2020    Pneumococcal Conjugate-13 09/10/2016   Pneumococcal Polysaccharide-23 12/11/2014   Td 09/26/1999, 03/19/2009    TDAP status: Due, Education has been provided regarding the importance of this vaccine. Advised may receive this vaccine at local pharmacy or Health Dept. Aware to provide a copy of the vaccination record if obtained from local pharmacy or Health Dept. Verbalized acceptance and understanding.  Flu Vaccine status: Up to date [facility]  Pneumococcal vaccine status: Up to date  Covid-19 vaccine status: Information provided on how to obtain vaccines.   Qualifies for Shingles Vaccine? Yes   Zostavax completed  unknown   Shingrix Completed?: Yes  Screening Tests Health Maintenance  Topic Date Due   Zoster Vaccines- Shingrix (1 of 2) Never done   DTaP/Tdap/Td (3 - Tdap) 03/20/2019   INFLUENZA VACCINE  05/26/2022   COVID-19 Vaccine (5 - 2023-24 season) 06/26/2022   COLONOSCOPY (Pts 45-64yrs Insurance coverage will need to be confirmed)  09/03/2022   Medicare Annual Wellness (AWV)  01/18/2024  Pneumonia Vaccine 66+ Years old  Completed   Hepatitis C Screening  Completed   HPV VACCINES  Aged Out    Health Maintenance  Health Maintenance Due  Topic Date Due   Zoster Vaccines- Shingrix (1 of 2) Never done   DTaP/Tdap/Td (3 - Tdap) 03/20/2019   INFLUENZA VACCINE  05/26/2022   COVID-19 Vaccine (5 - 2023-24 season) 06/26/2022   COLONOSCOPY (Pts 45-98yrs Insurance coverage will need to be confirmed)  09/03/2022    Colorectal cancer screening: Type of screening: Colonoscopy. Completed 09/04/2015. Repeat every 7 years  Lung Cancer Screening: (Low Dose CT Chest recommended if Age 85-80 years, 30 pack-year currently smoking OR have quit w/in 15years.) does not qualify.   Lung Cancer Screening Referral: no  Additional Screening:  Hepatitis C Screening: does not qualify; Completed 09/10/16  Vision Screening: Recommended annual ophthalmology exams for early detection of  glaucoma and other disorders of the eye. Is the patient up to date with their annual eye exam?  No  Who is the provider or what is the name of the office in which the patient attends annual eye exams? [facility] If pt is not established with a provider, would they like to be referred to a provider to establish care? No .   Dental Screening: Recommended annual dental exams for proper oral hygiene  Community Resource Referral / Chronic Care Management: CRR required this visit?  No   CCM required this visit?  No      Plan:     I have personally reviewed and noted the following in the patient's chart:   Medical and social history Use of alcohol, tobacco or illicit drugs  Current medications and supplements including opioid prescriptions. Patient is not currently taking opioid prescriptions. Functional ability and status Nutritional status Physical activity Advanced directives List of other physicians Hospitalizations, surgeries, and ER visits in previous 12 months Vitals Screenings to include cognitive, depression, and falls Referrals and appointments  In addition, I have reviewed and discussed with patient certain preventive protocols, quality metrics, and best practice recommendations. A written personalized care plan for preventive services as well as general preventive health recommendations were provided to patient.     Lebron Conners, LPN   [id number]   Nurse Notes: Order for colonoscopy placed at pt's request.

## 2023-01-18 NOTE — Patient Instructions (Signed)
Jacob Marsh , Thank you for taking time to come for your Medicare Wellness Visit. I appreciate your ongoing commitment to your health goals. Please review the following plan we discussed and let me know if I can assist you in the future.   These are the goals we discussed:  Goals      Patient Stated     01/16/2022 - I will continue to walk everyday for about 2-3 miles and lift weights 3x per week     Patient Stated     Gain some weight and stay active.        This is a list of the screening recommended for you and due dates:  Health Maintenance  Topic Date Due   Zoster (Shingles) Vaccine (1 of 2) Never done   DTaP/Tdap/Td vaccine (3 - Tdap) 03/20/2019   Flu Shot  05/26/2022   COVID-19 Vaccine (5 - 2023-24 season) 06/26/2022   Colon Cancer Screening  09/03/2022   Medicare Annual Wellness Visit  01/18/2024   Pneumonia Vaccine  Completed   Hepatitis C Screening: USPSTF Recommendation to screen - Ages 18-79 yo.  Completed   HPV Vaccine  Aged Out    Advanced directives: Please bring a copy of your health care power of attorney and living will to the office to be added to your chart at your convenience.   Conditions/risks identified: none  Next appointment: Follow up in one year for your annual wellness visit. 01/19/24 @ 3:00 telephone visit.  Preventive Care 70 Years and Older, Male  Preventive care refers to lifestyle choices and visits with your health care provider that can promote health and wellness. What does preventive care include? A yearly physical exam. This is also called an annual well check. Dental exams once or twice a year. Routine eye exams. Ask your health care provider how often you should have your eyes checked. Personal lifestyle choices, including: Daily care of your teeth and gums. Regular physical activity. Eating a healthy diet. Avoiding tobacco and drug use. Limiting alcohol use. Practicing safe sex. Taking low doses of aspirin every day. Taking  vitamin and mineral supplements as recommended by your health care provider. What happens during an annual well check? The services and screenings done by your health care provider during your annual well check will depend on your age, overall health, lifestyle risk factors, and family history of disease. Counseling  Your health care provider may ask you questions about your: Alcohol use. Tobacco use. Drug use. Emotional well-being. Home and relationship well-being. Sexual activity. Eating habits. History of falls. Memory and ability to understand (cognition). Work and work Statistician. Screening  You may have the following tests or measurements: Height, weight, and BMI. Blood pressure. Lipid and cholesterol levels. These may be checked every 5 years, or more frequently if you are over 3 years old. Skin check. Lung cancer screening. You may have this screening every year starting at age 36 if you have a 30-pack-year history of smoking and currently smoke or have quit within the past 15 years. Fecal occult blood test (FOBT) of the stool. You may have this test every year starting at age 4. Flexible sigmoidoscopy or colonoscopy. You may have a sigmoidoscopy every 5 years or a colonoscopy every 10 years starting at age 88. Prostate cancer screening. Recommendations will vary depending on your family history and other risks. Hepatitis C blood test. Hepatitis B blood test. Sexually transmitted disease (STD) testing. Diabetes screening. This is done by checking your blood sugar (  glucose) after you have not eaten for a while (fasting). You may have this done every 1-3 years. Abdominal aortic aneurysm (AAA) screening. You may need this if you are a current or former smoker. Osteoporosis. You may be screened starting at age 62 if you are at high risk. Talk with your health care provider about your test results, treatment options, and if necessary, the need for more tests. Vaccines  Your  health care provider may recommend certain vaccines, such as: Influenza vaccine. This is recommended every year. Tetanus, diphtheria, and acellular pertussis (Tdap, Td) vaccine. You may need a Td booster every 10 years. Zoster vaccine. You may need this after age 102. Pneumococcal 13-valent conjugate (PCV13) vaccine. One dose is recommended after age 85. Pneumococcal polysaccharide (PPSV23) vaccine. One dose is recommended after age 53. Talk to your health care provider about which screenings and vaccines you need and how often you need them. This information is not intended to replace advice given to you by your health care provider. Make sure you discuss any questions you have with your health care provider. Document Released: 11/08/2015 Document Revised: 07/01/2016 Document Reviewed: 08/13/2015 Elsevier Interactive Patient Education  2017 [employer] Prevention in the Home Falls can cause injuries. They can happen to people of all ages. There are many things you can do to make your home safe and to help prevent falls. What can I do on the outside of my home? Regularly fix the edges of walkways and driveways and fix any cracks. Remove anything that might make you trip as you walk through a door, such as a raised step or threshold. Trim any bushes or trees on the path to your home. Use bright outdoor lighting. Clear any walking paths of anything that might make someone trip, such as rocks or tools. Regularly check to see if handrails are loose or broken. Make sure that both sides of any steps have handrails. Any raised decks and porches should have guardrails on the edges. Have any leaves, snow, or ice cleared regularly. Use sand or salt on walking paths during winter. Clean up any spills in your garage right away. This includes oil or grease spills. What can I do in the bathroom? Use night lights. Install grab bars by the toilet and in the tub and shower. Do not use towel bars as  grab bars. Use non-skid mats or decals in the tub or shower. If you need to sit down in the shower, use a plastic, non-slip stool. Keep the floor dry. Clean up any water that spills on the floor as soon as it happens. Remove soap buildup in the tub or shower regularly. Attach bath mats securely with double-sided non-slip rug tape. Do not have throw rugs and other things on the floor that can make you trip. What can I do in the bedroom? Use night lights. Make sure that you have a light by your bed that is easy to reach. Do not use any sheets or blankets that are too big for your bed. They should not hang down onto the floor. Have a firm chair that has side arms. You can use this for support while you get dressed. Do not have throw rugs and other things on the floor that can make you trip. What can I do in the kitchen? Clean up any spills right away. Avoid walking on wet floors. Keep items that you use a lot in easy-to-reach places. If you need to reach something above you, use  a strong step stool that has a grab bar. Keep electrical cords out of the way. Do not use floor polish or wax that makes floors slippery. If you must use wax, use non-skid floor wax. Do not have throw rugs and other things on the floor that can make you trip. What can I do with my stairs? Do not leave any items on the stairs. Make sure that there are handrails on both sides of the stairs and use them. Fix handrails that are broken or loose. Make sure that handrails are as long as the stairways. Check any carpeting to make sure that it is firmly attached to the stairs. Fix any carpet that is loose or worn. Avoid having throw rugs at the top or bottom of the stairs. If you do have throw rugs, attach them to the floor with carpet tape. Make sure that you have a light switch at the top of the stairs and the bottom of the stairs. If you do not have them, ask someone to add them for you. What else can I do to help prevent  falls? Wear shoes that: Do not have high heels. Have rubber bottoms. Are comfortable and fit you well. Are closed at the toe. Do not wear sandals. If you use a stepladder: Make sure that it is fully opened. Do not climb a closed stepladder. Make sure that both sides of the stepladder are locked into place. Ask someone to hold it for you, if possible. Clearly mark and make sure that you can see: Any grab bars or handrails. First and last steps. Where the edge of each step is. Use tools that help you move around (mobility aids) if they are needed. These include: Canes. Walkers. Scooters. Crutches. Turn on the lights when you go into a dark area. Replace any light bulbs as soon as they burn out. Set up your furniture so you have a clear path. Avoid moving your furniture around. If any of your floors are uneven, fix them. If there are any pets around you, be aware of where they are. Review your medicines with your doctor. Some medicines can make you feel dizzy. This can increase your chance of falling. Ask your doctor what other things that you can do to help prevent falls. This information is not intended to replace advice given to you by your health care provider. Make sure you discuss any questions you have with your health care provider. Document Released: 08/08/2009 Document Revised: 03/19/2016 Document Reviewed: 11/16/2014 Elsevier Interactive Patient Education  2017 [employer].

## 2023-01-21 DIAGNOSIS — L814 Other melanin hyperpigmentation: Secondary | ICD-10-CM | POA: Diagnosis not present

## 2023-01-21 DIAGNOSIS — D225 Melanocytic nevi of trunk: Secondary | ICD-10-CM | POA: Diagnosis not present

## 2023-01-21 DIAGNOSIS — L570 Actinic keratosis: Secondary | ICD-10-CM | POA: Diagnosis not present

## 2023-01-21 DIAGNOSIS — L821 Other seborrheic keratosis: Secondary | ICD-10-CM | POA: Diagnosis not present

## 2023-04-19 DIAGNOSIS — E039 Hypothyroidism, unspecified: Secondary | ICD-10-CM | POA: Diagnosis not present

## 2023-04-19 DIAGNOSIS — I10 Essential (primary) hypertension: Secondary | ICD-10-CM | POA: Diagnosis not present

## 2023-04-19 DIAGNOSIS — E60 Dietary zinc deficiency: Secondary | ICD-10-CM | POA: Diagnosis not present

## 2023-04-19 DIAGNOSIS — G459 Transient cerebral ischemic attack, unspecified: Secondary | ICD-10-CM | POA: Diagnosis not present

## 2023-04-19 DIAGNOSIS — I499 Cardiac arrhythmia, unspecified: Secondary | ICD-10-CM | POA: Diagnosis not present

## 2023-04-19 DIAGNOSIS — F4321 Adjustment disorder with depressed mood: Secondary | ICD-10-CM | POA: Diagnosis not present

## 2023-05-03 DIAGNOSIS — E039 Hypothyroidism, unspecified: Secondary | ICD-10-CM | POA: Diagnosis not present

## 2023-05-03 DIAGNOSIS — I499 Cardiac arrhythmia, unspecified: Secondary | ICD-10-CM | POA: Diagnosis not present

## 2023-05-03 DIAGNOSIS — F4321 Adjustment disorder with depressed mood: Secondary | ICD-10-CM | POA: Diagnosis not present

## 2023-05-03 DIAGNOSIS — G459 Transient cerebral ischemic attack, unspecified: Secondary | ICD-10-CM | POA: Diagnosis not present

## 2023-05-03 DIAGNOSIS — E60 Dietary zinc deficiency: Secondary | ICD-10-CM | POA: Diagnosis not present

## 2023-05-03 DIAGNOSIS — I10 Essential (primary) hypertension: Secondary | ICD-10-CM | POA: Diagnosis not present

## 2023-05-06 ENCOUNTER — Other Ambulatory Visit: Payer: Medicare Other

## 2023-05-10 LAB — LAB REPORT - SCANNED
A1c: 5.3
EGFR: 86

## 2023-05-12 ENCOUNTER — Encounter: Payer: Medicare Other | Admitting: Family Medicine

## 2023-05-23 ENCOUNTER — Telehealth: Payer: Self-pay | Admitting: Family Medicine

## 2023-05-23 DIAGNOSIS — D696 Thrombocytopenia, unspecified: Secondary | ICD-10-CM

## 2023-05-23 DIAGNOSIS — Z13220 Encounter for screening for lipoid disorders: Secondary | ICD-10-CM

## 2023-05-23 DIAGNOSIS — R7989 Other specified abnormal findings of blood chemistry: Secondary | ICD-10-CM

## 2023-05-23 DIAGNOSIS — Z125 Encounter for screening for malignant neoplasm of prostate: Secondary | ICD-10-CM

## 2023-05-23 DIAGNOSIS — I4891 Unspecified atrial fibrillation: Secondary | ICD-10-CM

## 2023-05-23 DIAGNOSIS — N401 Enlarged prostate with lower urinary tract symptoms: Secondary | ICD-10-CM

## 2023-05-23 DIAGNOSIS — E559 Vitamin D deficiency, unspecified: Secondary | ICD-10-CM

## 2023-05-23 NOTE — Telephone Encounter (Signed)
-----   Message from Lovena Neighbours sent at 05/05/2023  3:33 PM EDT ----- Regarding: Labs 7.29.24 Please put physical lab orders in future. Thank you, Denny Peon

## 2023-05-24 ENCOUNTER — Other Ambulatory Visit (INDEPENDENT_AMBULATORY_CARE_PROVIDER_SITE_OTHER): Payer: Medicare Other

## 2023-05-24 DIAGNOSIS — R350 Frequency of micturition: Secondary | ICD-10-CM

## 2023-05-24 DIAGNOSIS — E559 Vitamin D deficiency, unspecified: Secondary | ICD-10-CM | POA: Diagnosis not present

## 2023-05-24 DIAGNOSIS — D696 Thrombocytopenia, unspecified: Secondary | ICD-10-CM

## 2023-05-24 DIAGNOSIS — Z125 Encounter for screening for malignant neoplasm of prostate: Secondary | ICD-10-CM | POA: Diagnosis not present

## 2023-05-24 DIAGNOSIS — Z13220 Encounter for screening for lipoid disorders: Secondary | ICD-10-CM | POA: Diagnosis not present

## 2023-05-24 DIAGNOSIS — I4891 Unspecified atrial fibrillation: Secondary | ICD-10-CM | POA: Diagnosis not present

## 2023-05-24 DIAGNOSIS — N401 Enlarged prostate with lower urinary tract symptoms: Secondary | ICD-10-CM

## 2023-05-24 DIAGNOSIS — R7989 Other specified abnormal findings of blood chemistry: Secondary | ICD-10-CM | POA: Diagnosis not present

## 2023-05-24 LAB — CBC WITH DIFFERENTIAL/PLATELET
Basophils Absolute: 0 10*3/uL (ref 0.0–0.1)
Basophils Relative: 0.8 % (ref 0.0–3.0)
Eosinophils Absolute: 0.1 10*3/uL (ref 0.0–0.7)
Eosinophils Relative: 1.3 % (ref 0.0–5.0)
HCT: 45.9 % (ref 39.0–52.0)
Hemoglobin: 15 g/dL (ref 13.0–17.0)
Lymphocytes Relative: 28.4 % (ref 12.0–46.0)
Lymphs Abs: 1.4 10*3/uL (ref 0.7–4.0)
MCHC: 32.7 g/dL (ref 30.0–36.0)
MCV: 97.9 fl (ref 78.0–100.0)
Monocytes Absolute: 0.5 10*3/uL (ref 0.1–1.0)
Monocytes Relative: 11 % (ref 3.0–12.0)
Neutro Abs: 2.9 10*3/uL (ref 1.4–7.7)
Neutrophils Relative %: 58.5 % (ref 43.0–77.0)
Platelets: 147 10*3/uL — ABNORMAL LOW (ref 150.0–400.0)
RBC: 4.68 Mil/uL (ref 4.22–5.81)
RDW: 13.6 % (ref 11.5–15.5)
WBC: 4.9 10*3/uL (ref 4.0–10.5)

## 2023-05-24 LAB — LIPID PANEL
Cholesterol: 198 mg/dL (ref 0–200)
HDL: 78.7 mg/dL (ref >39.00)
LDL Cholesterol: 105 mg/dL — ABNORMAL HIGH (ref 0–99)
NonHDL: 119.1
Total CHOL/HDL Ratio: 3
Triglycerides: 69 mg/dL (ref 0.0–149.0)
VLDL: 13.8 mg/dL (ref 0.0–40.0)

## 2023-05-24 LAB — COMPREHENSIVE METABOLIC PANEL
ALT: 22 U/L (ref 0–53)
AST: 24 U/L (ref 0–37)
Albumin: 4.3 g/dL (ref 3.5–5.2)
Alkaline Phosphatase: 47 U/L (ref 39–117)
BUN: 20 mg/dL (ref 6–23)
CO2: 29 mEq/L (ref 19–32)
Calcium: 9.5 mg/dL (ref 8.4–10.5)
Chloride: 103 mEq/L (ref 96–112)
Creatinine, Ser: 0.88 mg/dL (ref 0.40–1.50)
GFR: 85.17 mL/min (ref >60.00)
Glucose, Bld: 87 mg/dL (ref 70–99)
Potassium: 4 mEq/L (ref 3.5–5.1)
Sodium: 139 mEq/L (ref 135–145)
Total Bilirubin: 0.9 mg/dL (ref 0.2–1.2)
Total Protein: 6.3 g/dL (ref 6.0–8.3)

## 2023-05-24 LAB — VITAMIN D 25 HYDROXY (VIT D DEFICIENCY, FRACTURES): VITD: 65.38 ng/mL (ref 30.00–100.00)

## 2023-05-24 LAB — PSA, MEDICARE: PSA: 2.25 ng/ml (ref 0.10–4.00)

## 2023-05-24 LAB — TSH: TSH: 5.42 u[IU]/mL (ref 0.35–5.50)

## 2023-05-31 ENCOUNTER — Encounter: Payer: Self-pay | Admitting: Family Medicine

## 2023-05-31 ENCOUNTER — Ambulatory Visit (INDEPENDENT_AMBULATORY_CARE_PROVIDER_SITE_OTHER): Payer: Medicare Other | Admitting: Family Medicine

## 2023-05-31 VITALS — BP 110/64 | HR 60 | Temp 97.6°F | Ht 71.25 in | Wt 152.5 lb

## 2023-05-31 DIAGNOSIS — N401 Enlarged prostate with lower urinary tract symptoms: Secondary | ICD-10-CM | POA: Diagnosis not present

## 2023-05-31 DIAGNOSIS — Z1589 Genetic susceptibility to other disease: Secondary | ICD-10-CM | POA: Diagnosis not present

## 2023-05-31 DIAGNOSIS — D696 Thrombocytopenia, unspecified: Secondary | ICD-10-CM | POA: Diagnosis not present

## 2023-05-31 DIAGNOSIS — R7989 Other specified abnormal findings of blood chemistry: Secondary | ICD-10-CM

## 2023-05-31 DIAGNOSIS — E559 Vitamin D deficiency, unspecified: Secondary | ICD-10-CM | POA: Diagnosis not present

## 2023-05-31 DIAGNOSIS — I4891 Unspecified atrial fibrillation: Secondary | ICD-10-CM

## 2023-05-31 DIAGNOSIS — Z125 Encounter for screening for malignant neoplasm of prostate: Secondary | ICD-10-CM

## 2023-05-31 DIAGNOSIS — Z13220 Encounter for screening for lipoid disorders: Secondary | ICD-10-CM

## 2023-05-31 DIAGNOSIS — Z800 Family history of malignant neoplasm of digestive organs: Secondary | ICD-10-CM

## 2023-05-31 NOTE — Assessment & Plan Note (Signed)
Continues to work with a functional specialist and takes some vitamins/herbs  Socially active and physically active

## 2023-05-31 NOTE — Assessment & Plan Note (Signed)
Disc goals for lipids and reasons to control them Rev last labs with pt Rev low sat fat diet in detail LDL is 105/ stable HDL up to 798 Also brought labs from his functional doctor to review  Overall good

## 2023-05-31 NOTE — Assessment & Plan Note (Signed)
No clinical changes  Lab Results  Component Value Date   PSA 2.25 05/24/2023   PSA 1.66 03/16/2022   PSA 1.91 02/12/2021

## 2023-05-31 NOTE — Assessment & Plan Note (Signed)
Contineus to see cardiology  Wears apple watch No recent issues Continues beta blocker Not on anticoagulation

## 2023-05-31 NOTE — Assessment & Plan Note (Signed)
Lab Results  Component Value Date   TSH 5.42 05/24/2023   Normal here  Reviewed labs from funciontal specialist  Would not supplement unless symptomatic due to history of a fib

## 2023-05-31 NOTE — Assessment & Plan Note (Signed)
Platelet ct improved to 147  No symptoms

## 2023-05-31 NOTE — Patient Instructions (Addendum)
You can do another cologuard test 02/2024- if you don't hear from the company let us know   Keep up the great health habits   Keep exercising (especially strength training)  Add some strength training to your routine, this is important for bone and brain health and can reduce your risk of falls and help your body use insulin properly and regulate weight  Light weights, exercise bands , and internet videos are a good way to start  Yoga (chair or regular), machines , floor exercises or a gym with machines are also good options   Use sun protection when outdoors

## 2023-05-31 NOTE — Assessment & Plan Note (Signed)
Last vitamin D Lab Results  Component Value Date   VD25OH 65.38 05/24/2023   Vitamin D level is therapeutic with current supplementation Disc importance of this to bone and overall health

## 2023-05-31 NOTE — Assessment & Plan Note (Addendum)
R

## 2023-05-31 NOTE — Progress Notes (Signed)
Subjective:    Patient ID: Jacob Marsh, male    DOB: 1950-11-02, 72 y.o.   MRN: [id number]  HPI  Pt presents for annual follow up of chronic medical problems   Wt Readings from Last 3 Encounters:  05/31/23 152 lb 8 oz (69.2 kg)  01/18/23 152 lb (68.9 kg)  08/25/22 153 lb 3.2 oz (69.5 kg)   21.12 kg/m  Vitals:   05/31/23 1024  BP: 110/64  Pulse: 60  Temp: 97.6 F (36.4 C)  SpO2: 97%    Immunization History  Administered Date(s) Administered   Fluad Quad(high Dose 65+) 08/08/2019   Influenza Split 08/17/2012   Influenza, High Dose Seasonal PF 09/10/2018   Influenza,inj,Quad PF,6+ Mos 12/11/2014, 09/10/2016, 09/14/2017   Influenza-Unspecified 09/09/2021   Moderna SARS-COV2 Booster Vaccination 06/06/2021   Moderna Sars-Covid-2 Vaccination 11/28/2019, 12/26/2019, 08/23/2020   Pneumococcal Conjugate-13 09/10/2016   Pneumococcal Polysaccharide-23 12/11/2014   Td 09/26/1999, 03/19/2009    Health Maintenance Due  Topic Date Due   Zoster Vaccines- Shingrix (1 of 2) Never done   DTaP/Tdap/Td (3 - Tdap) 03/20/2019   Colonoscopy  09/03/2022   INFLUENZA VACCINE  06-17-23    Wife passed away in 12/28/22   Feels ok physically    Tetanus shot -thinks he got about 2 years ago   Colonoscopy 08/2015 - 10 year recall (he tried to get one early but could not)  Diet is good  Father had colon polyps  PGM with colon cancer  Cologuard neg 2022   M aunt may have had colon cancer    MGM had liver cancer   Prostate health Lab Results  Component Value Date   PSA 2.25 05/24/2023   PSA 1.66 03/16/2022   PSA 1.91 02/12/2021  History of BPH   Doing well symptom wise  Avoids fluids right before bed  Occational nocturia if he wakes up for another reason  Sleeping better  No change in stream/flow  More frequency during the day when he drinks lot of fluids   Drinks 2 left per day at least  Especially in heat    Bone health  Falls-has had two falls  Walking dog  and tripped on railroad tie - dog pulled him  2nd time- slipped on a very slippery wet bridge on hike  No injuries  Stumbled while sailing   [employer] -vit D , also B complex and other supplements / zinc/ tumeric/selenium/ probiotics omega 3  Last vitamin D Lab Results  Component Value Date   VD25OH 65.38 05/24/2023    Exercise -walks 3 miles per day  Weights  Stretching   Sees dermatology twice a year No recent problems  A lot of sun in the past    Mood    05/31/2023   10:27 AM 01/18/2023   11:03 AM 01/16/2022    1:02 PM 01/15/2021    1:08 PM 01/02/2020   12:16 PM  Depression screen PHQ 2/9  Decreased Interest 1 0 0 0 0  Down, Depressed, Hopeless 1 0 0 0 0  PHQ - 2 Score 2 0 0 0 0  Altered sleeping 0   0 0  Tired, decreased energy 1   0 0  Change in appetite 0   0 0  Feeling bad or failure about yourself  1   0 0  Trouble concentrating 0   0 0  Moving slowly or fidgety/restless 0   0 0  Suicidal thoughts 0   0 0  PHQ-9  Score 4   0 0  Difficult doing work/chores Not difficult at all   Not difficult at all Not difficult at all  Thinks he is doing ok with grief  Good support -sees his kids often Does not think he needs grief counseling  More good days and bad  Sleeping a lot better    A lot of stress is relieved now     History of a fib  Metoprolol 1/2 pill daily and 1/2 pill prn Flecainide    Lab Results  Component Value Date   WBC 4.9 05/24/2023   HGB 15.0 05/24/2023   HCT 45.9 05/24/2023   MCV 97.9 05/24/2023   PLT 147.0 (L) 05/24/2023   History of mildly low platelets  Lipids Lab Results  Component Value Date   CHOL 198 05/24/2023   CHOL 183 03/16/2022   CHOL 143 02/12/2021   Lab Results  Component Value Date   HDL 78.70 05/24/2023   HDL 65.30 03/16/2022   HDL 56.50 02/12/2021   Lab Results  Component Value Date   LDLCALC 105 (H) 05/24/2023   LDLCALC 106 (H) 03/16/2022   LDLCALC 74 02/12/2021   Lab Results   Component Value Date   TRIG 69.0 05/24/2023   TRIG 59.0 03/16/2022   TRIG 59.0 02/12/2021   Lab Results  Component Value Date   CHOLHDL 3 05/24/2023   CHOLHDL 3 03/16/2022   CHOLHDL 3 02/12/2021   No results found for: "LDLDIRECT"  Takes omega 3 supplement    History of apo E genotype Taking supplements to avoid dementia with healthy diet  Still working with a functional medicine doctor    Vit D def Last vitamin D Lab Results  Component Value Date   VD25OH 65.38 05/24/2023   Lab Results  Component Value Date   TSH 5.42 05/24/2023   Was told a low dose of T4 from compouding pharmacy  His FT4 was 1.14 last check   Lab Results  Component Value Date   NA 139 05/24/2023   K 4.0 05/24/2023   CO2 29 05/24/2023   GLUCOSE 87 05/24/2023   BUN 20 05/24/2023   CREATININE 0.88 05/24/2023   CALCIUM 9.5 05/24/2023   GFR 85.17 05/24/2023   GFRNONAA 88 10/17/2019   Lab Results  Component Value Date   ALT 22 05/24/2023   AST 24 05/24/2023   ALKPHOS 47 05/24/2023   BILITOT 0.9 05/24/2023      Patient Active Problem List   Diagnosis Date Noted   Family history of colon cancer 06/19/2022   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   Vitamin D deficiency 02/18/2021   Elevated TSH 02/18/2021   A-fib (HCC) 10/17/2019   Dupuytren contracture 12/29/2017   Trigger finger of both hands    Hx of echocardiogram    Diverticulosis    BPH (benign prostatic hyperplasia) 09/15/2016   Need for hepatitis C screening test 09/07/2016   Encounter for Medicare annual wellness exam 12/11/2014   Screening for lipoid disorders 12/03/2014   PVCs (premature ventricular contractions) 03/20/2014   Postural dizziness 03/20/2014   Palpitations 08/10/2013   History of chest pain 08/17/2012   Routine general medical examination at a health care facility 08/09/2012   Prostate cancer screening 08/09/2012   Thrombocytopenia (HCC) 07/18/2009   Past Medical History:  Diagnosis Date   A-fib (HCC)  10/17/2019   BPH (benign prostatic hyperplasia) 09/15/2016   Mild symptoms     Depression    Diverticulosis    History of diverticulosis// flex sig  2006//colonoscopy 01/21/2000 diverticulosis   Dupuytren contracture 12/29/2017   GERD (gastroesophageal reflux disease)    Globus sensation 07/17/2014   Hx of echocardiogram    Echo (9/14): EF 55%, grade 1 diastolic dysfunction, trivial AI, trivial MR, MAC, PASP 29   Palpitations 08/10/2013   Postural dizziness 03/20/2014   PVC (premature ventricular contraction)    PVCs (premature ventricular contractions) 03/20/2014   Trigger finger of both hands    Past Surgical History:  Procedure Laterality Date   A-FLUTTER ABLATION N/A 11/13/2019   Procedure: A-FLUTTER ABLATION;  Surgeon: Marinus Maw, MD;  Location: [facility];  Service: Cardiovascular;  Laterality: N/A;   DUPUYTREN CONTRACTURE RELEASE     HAND SURGERY Right 08/2020   TONSILLECTOMY     WISDOM TOOTH EXTRACTION     Social History   Tobacco Use   Smoking status: Never   Smokeless tobacco: Never  Vaping Use   Vaping status: Never Used  Substance Use Topics   Alcohol use: Yes    Alcohol/week: 1.0 standard drink of alcohol    Types: 1 Glasses of wine per week    Comment: occ   Drug use: No   Family History  Problem Relation Age of Onset   Arthritis Mother    Heart disease Mother        ? atrial fib   Colon polyps Father    Depression Sister    Heart disease Maternal Grandmother        CAD   Cancer Maternal Grandmother        liver cancer   Heart disease Maternal Grandfather        CAD   Colon cancer Paternal Grandmother    Cancer - Colon Paternal Grandmother    Cancer Maternal Aunt        ?colon cancer   Colon cancer Paternal Uncle    Sudden death Neg Hx    No Known Allergies Current Outpatient Medications on File Prior to Visit  Medication Sig Dispense Refill   acetaminophen (TYLENOL) 500 MG tablet Take 1,000 mg by mouth every 6 (six) hours as needed  for moderate pain or headache.     b complex vitamins capsule Take 1 capsule by mouth in the morning and at bedtime.     Charcoal Activated 280 MG CAPS Take 2 capsules by mouth daily.     Cholecalciferol (VITAMIN D-3 PO) Take 200 mcg by mouth daily.     flecainide (TAMBOCOR) 100 MG tablet TAKE 1 TABLET BY MOUTH TWICE DAILY, MAY TAKE ADDITIONAL 1/2 TABLET DAILY FOR BREAKTHROUGH PALPITATIONS 225 tablet 2   MAGNESIUM MALATE PO Take 360 mg by mouth in the morning and at bedtime.     Melatonin 1 MG CAPS Take 1.5 capsules by mouth at bedtime.     metoprolol tartrate (LOPRESSOR) 25 MG tablet TAKE 1/2 TABLET BY MOUTH EVERY NIGHT AT BEDTIME. MAY TAKE AN ADDITIONAL 1/2 TABLET AS NEEDED FOR PALPITATION 90 tablet 3   Multiple Vitamin (MULTIVITAMIN WITH MINERALS) TABS Take 1 tablet by mouth daily.      Omega-3 Fatty Acids (OMEGA-3 FISH OIL PO) Take 345 mg by mouth in the morning and at bedtime.     OVER THE COUNTER MEDICATION Take 1 tablet by mouth daily. NEUROMAG (Chelted Magnesium L-Thronate 50mg )     OVER THE COUNTER MEDICATION Take 2 capsules by mouth daily. LION'S MANE MUSHROOM (450 MG)     OVER THE COUNTER MEDICATION Take 1 capsule by mouth daily. BRAIN VITALE  SACCHAROMYCES BOULARDII PO Take 420 mg by mouth in the morning and at bedtime.     Selenium 200 MCG CAPS Take 1 capsule by mouth daily.     TURMERIC CURCUMIN PO Take 500 mg by mouth in the morning and at bedtime.     ZINC PICOLINATE PO Take 25 mg by mouth in the morning and at bedtime.     No current facility-administered medications on file prior to visit.    Review of Systems  Constitutional:  Positive for fatigue. Negative for activity change, appetite change, fever and unexpected weight change.  HENT:  Negative for congestion, rhinorrhea, sore throat and trouble swallowing.   Eyes:  Negative for pain, redness, itching and visual disturbance.  Respiratory:  Negative for cough, chest tightness, shortness of breath and wheezing.    Cardiovascular:  Negative for chest pain and palpitations.  Gastrointestinal:  Negative for abdominal pain, blood in stool, constipation, diarrhea and nausea.  Endocrine: Negative for cold intolerance, heat intolerance, polydipsia and polyuria.  Genitourinary:  Negative for difficulty urinating, dysuria, frequency and urgency.  Musculoskeletal:  Negative for arthralgias, joint swelling and myalgias.  Skin:  Negative for pallor and rash.  Neurological:  Negative for dizziness, tremors, weakness, numbness and headaches.  Hematological:  Negative for adenopathy. Does not bruise/bleed easily.  Psychiatric/Behavioral:  Negative for decreased concentration and dysphoric mood. The patient is not nervous/anxious.        Grief       Objective:   Physical Exam Constitutional:      General: He is not in acute distress.    Appearance: Normal appearance. He is well-developed and normal weight. He is not ill-appearing or diaphoretic.  HENT:     Head: Normocephalic and atraumatic.     Right Ear: Tympanic membrane, ear canal and external ear normal.     Left Ear: Tympanic membrane, ear canal and external ear normal.     Nose: Nose normal. No congestion.     Mouth/Throat:     Mouth: Mucous membranes are moist.     Pharynx: Oropharynx is clear. No posterior oropharyngeal erythema.  Eyes:     General: No scleral icterus.       Right eye: No discharge.        Left eye: No discharge.     Conjunctiva/sclera: Conjunctivae normal.     Pupils: Pupils are equal, round, and reactive to light.  Neck:     Thyroid: No thyromegaly.     Vascular: No carotid bruit or JVD.  Cardiovascular:     Rate and Rhythm: Normal rate and regular rhythm.     Pulses: Normal pulses.     Heart sounds: Normal heart sounds.     No gallop.  Pulmonary:     Effort: Pulmonary effort is normal. No respiratory distress.     Breath sounds: Normal breath sounds. No wheezing or rales.     Comments: Good air exch Chest:     Chest  wall: No tenderness.  Abdominal:     General: Bowel sounds are normal. There is no distension or abdominal bruit.     Palpations: Abdomen is soft. There is no mass.     Tenderness: There is no abdominal tenderness.     Hernia: No hernia is present.  Musculoskeletal:        General: No tenderness.     Cervical back: Normal range of motion and neck supple. No rigidity. No muscular tenderness.     Right lower leg: No edema.  Left lower leg: No edema.  Lymphadenopathy:     Cervical: No cervical adenopathy.  Skin:    General: Skin is warm and dry.     Coloration: Skin is not pale.     Findings: No erythema or rash.     Comments: Solar lentigines diffusely Some sks   Neurological:     Mental Status: He is alert.     Cranial Nerves: No cranial nerve deficit.     Motor: No abnormal muscle tone.     Coordination: Coordination normal.     Gait: Gait normal.     Deep Tendon Reflexes: Reflexes are normal and symmetric. Reflexes normal.  Psychiatric:        Attention and Perception: Attention normal.        Mood and Affect: Mood normal.        Cognition and Memory: Cognition and memory normal.     Comments: Mood is good           Assessment & Plan:   Problem List Items Addressed This Visit       Cardiovascular and Mediastinum   A-fib (HCC)    Contineus to see cardiology  Wears apple watch No recent issues Continues beta blocker Not on anticoagulation          Genitourinary   BPH (benign prostatic hyperplasia)    No clinical changes  Lab Results  Component Value Date   PSA 2.25 05/24/2023   PSA 1.66 03/16/2022   PSA 1.91 02/12/2021            Hematopoietic and Hemostatic   Thrombocytopenia (HCC)    Platelet ct improved to 147  No symptoms         Other   Vitamin D deficiency    Last vitamin D Lab Results  Component Value Date   VD25OH 65.38 05/24/2023   Vitamin D level is therapeutic with current supplementation Disc importance of this to bone  and overall health       Screening for lipoid disorders - Primary    Disc goals for lipids and reasons to control them Rev last labs with pt Rev low sat fat diet in detail LDL is 105/ stable HDL up to 798 Also brought labs from his functional doctor to review  Overall good       Prostate cancer screening    Lab Results  Component Value Date   PSA 2.25 05/24/2023   PSA 1.66 03/16/2022   PSA 1.91 02/12/2021    No clinical changes       Family history of colon cancer    Not a first degree relative Colonoscopy 2016  Cologuard neg 2022      Elevated TSH    Lab Results  Component Value Date   TSH 5.42 05/24/2023   Normal here  Reviewed labs from funciontal specialist  Would not supplement unless symptomatic due to history of a fib       APOE*3/*4 genotype    Continues to work with a functional specialist and takes some vitamins/herbs  Socially active and physically active

## 2023-05-31 NOTE — Assessment & Plan Note (Signed)
Lab Results  Component Value Date   PSA 2.25 05/24/2023   PSA 1.66 03/16/2022   PSA 1.91 02/12/2021    No clinical changes

## 2023-05-31 NOTE — Assessment & Plan Note (Signed)
Not a first degree relative Colonoscopy 2016  Cologuard neg 2022

## 2023-07-05 ENCOUNTER — Other Ambulatory Visit: Payer: Self-pay | Admitting: Medical Genetics

## 2023-07-05 DIAGNOSIS — Z006 Encounter for examination for normal comparison and control in clinical research program: Secondary | ICD-10-CM

## 2023-08-05 DIAGNOSIS — L570 Actinic keratosis: Secondary | ICD-10-CM | POA: Diagnosis not present

## 2023-08-05 DIAGNOSIS — L821 Other seborrheic keratosis: Secondary | ICD-10-CM | POA: Diagnosis not present

## 2023-08-05 DIAGNOSIS — D225 Melanocytic nevi of trunk: Secondary | ICD-10-CM | POA: Diagnosis not present

## 2023-08-05 DIAGNOSIS — L814 Other melanin hyperpigmentation: Secondary | ICD-10-CM | POA: Diagnosis not present

## 2023-08-06 DIAGNOSIS — Z23 Encounter for immunization: Secondary | ICD-10-CM | POA: Diagnosis not present

## 2023-09-28 ENCOUNTER — Ambulatory Visit: Payer: Medicare Other | Attending: Internal Medicine | Admitting: Internal Medicine

## 2023-09-28 ENCOUNTER — Encounter: Payer: Self-pay | Admitting: Internal Medicine

## 2023-09-28 VITALS — BP 108/60 | HR 58 | Ht 71.5 in | Wt 154.0 lb

## 2023-09-28 DIAGNOSIS — I4891 Unspecified atrial fibrillation: Secondary | ICD-10-CM | POA: Diagnosis not present

## 2023-09-28 NOTE — Patient Instructions (Addendum)

## 2023-09-28 NOTE — Progress Notes (Signed)
HPI [name] returns today for followup of his PVC's and atrial fib and flutter. He is a pleasant 72 yo man with a h/o the above symptoms. He has a h/o persistent atrial flutter and is s/p flutter ablation almost 4 years ago. During his ablation he has some brief atrial fib of uncertain clinical significance. He has not had any symptoms. He has been maintained on a combination of a beta blocker and flecainide.  He saw me last about 14 months ago. He overall feels quite well with minimal palpitations. No Known Allergies   Current Outpatient Medications  Medication Sig Dispense Refill   acetaminophen (TYLENOL) 500 MG tablet Take 1,000 mg by mouth every 6 (six) hours as needed for moderate pain or headache.     b complex vitamins capsule Take 1 capsule by mouth in the morning and at bedtime.     Charcoal Activated 280 MG CAPS Take 2 capsules by mouth daily.     Cholecalciferol (VITAMIN D-3 PO) Take 200 mcg by mouth daily.     flecainide (TAMBOCOR) 100 MG tablet TAKE 1 TABLET BY MOUTH TWICE DAILY, MAY TAKE ADDITIONAL 1/2 TABLET DAILY FOR BREAKTHROUGH PALPITATIONS 225 tablet 2   MAGNESIUM MALATE PO Take 360 mg by mouth in the morning and at bedtime.     Melatonin 1 MG CAPS Take 1.5 capsules by mouth at bedtime.     metoprolol tartrate (LOPRESSOR) 25 MG tablet TAKE 1/2 TABLET BY MOUTH EVERY NIGHT AT BEDTIME. MAY TAKE AN ADDITIONAL 1/2 TABLET AS NEEDED FOR PALPITATION 90 tablet 3   Multiple Vitamin (MULTIVITAMIN WITH MINERALS) TABS Take 1 tablet by mouth daily.      Omega-3 Fatty Acids (OMEGA-3 FISH OIL PO) Take 345 mg by mouth in the morning and at bedtime.     OVER THE COUNTER MEDICATION Take 1 tablet by mouth daily. NEUROMAG (Chelted Magnesium L-Thronate 50mg )     OVER THE COUNTER MEDICATION Take 2 capsules by mouth daily. LION'S MANE MUSHROOM (450 MG)     OVER THE COUNTER MEDICATION Take 1 capsule by mouth daily. BRAIN VITALE     SACCHAROMYCES BOULARDII PO Take 420 mg by mouth in the  morning and at bedtime.     Selenium 200 MCG CAPS Take 1 capsule by mouth daily.     TURMERIC CURCUMIN PO Take 500 mg by mouth in the morning and at bedtime.     ZINC PICOLINATE PO Take 25 mg by mouth in the morning and at bedtime.     No current facility-administered medications for this visit.     Past Medical History:  Diagnosis Date   A-fib (HCC) 10/17/2019   BPH (benign prostatic hyperplasia) 09/15/2016   Mild symptoms     Depression    Diverticulosis    History of diverticulosis// flex sig 2006//colonoscopy 01/21/2000 diverticulosis   Dupuytren contracture 12/29/2017   GERD (gastroesophageal reflux disease)    Globus sensation 07/17/2014   Hx of echocardiogram    Echo (9/14): EF 55%, grade 1 diastolic dysfunction, trivial AI, trivial MR, MAC, PASP 29   Palpitations 08/10/2013   Postural dizziness 03/20/2014   PVC (premature ventricular contraction)    PVCs (premature ventricular contractions) 03/20/2014   Trigger finger of both hands     ROS:   All systems reviewed and negative except as noted in the HPI.   Past Surgical History:  Procedure Laterality Date   A-FLUTTER ABLATION N/A 11/13/2019   Procedure: A-FLUTTER ABLATION;  Surgeon: Ladona Ridgel,  Doylene Canning, MD;  Location: [facility];  Service: Cardiovascular;  Laterality: N/A;   DUPUYTREN CONTRACTURE RELEASE     HAND SURGERY Right 08/2020   TONSILLECTOMY     WISDOM TOOTH EXTRACTION       Family History  Problem Relation Age of Onset   Arthritis Mother    Heart disease Mother        ? atrial fib   Colon polyps Father    Depression Sister    Heart disease Maternal Grandmother        CAD   Cancer Maternal Grandmother        liver cancer   Heart disease Maternal Grandfather        CAD   Colon cancer Paternal Grandmother    Cancer - Colon Paternal Grandmother    Cancer Maternal Aunt        ?colon cancer   Colon cancer Paternal Uncle    Sudden death Neg Hx      Social History   Socioeconomic History    Marital status: Widowed    Spouse name: Not on file   Number of children: 3   Years of education: Not on file   Highest education level: Not on file  Occupational History   Occupation: works from home  Tobacco Use   Smoking status: Never   Smokeless tobacco: Never  Vaping Use   Vaping status: Never Used  Substance and Sexual Activity   Alcohol use: Yes    Alcohol/week: 1.0 standard drink of alcohol    Types: 1 Glasses of wine per week    Comment: occ   Drug use: No   Sexual activity: Yes  Other Topics Concern   Not on file  Social History Narrative   Wife has alzheimer's and is living in a SNF since 2020   Still working as well    Children live in [place], [place], [place], [place], and [place], but they visit each other several times per year   He's close with his sister who lives nearby   Social Determinants of Health   Financial Resource Strain: Low Risk  (01/16/2022)   Overall Financial Resource Strain (CARDIA)    Difficulty of Paying Living Expenses: Not hard at all  Food Insecurity: No Food Insecurity (01/18/2023)   Hunger Vital Sign    Worried About Running Out of Food in the Last Year: Never true    Ran Out of Food in the Last Year: Never true  Transportation Needs: No Transportation Needs (01/18/2023)   PRAPARE - Administrator, Civil Service (Medical): No    Lack of Transportation (Non-Medical): No  Physical Activity: Sufficiently Active (01/18/2023)   Exercise Vital Sign    Days of Exercise per Week: 3 days    Minutes of Exercise per Session: 60 min  Stress: No Stress Concern Present (01/16/2022)   [employer] of Occupational Health - Occupational Stress Questionnaire    Feeling of Stress : Not at all  Social Connections: Moderately Isolated (01/18/2023)   Social Connection and Isolation Panel [NHANES]    Frequency of Communication with Friends and Family: More than three times a week    Frequency of Social Gatherings with Friends and Family: Three times a  week    Attends Religious Services: Never    Active Member of Clubs or Organizations: Yes    Attends Banker Meetings: More than 4 times per year    Marital Status: Widowed  Intimate Partner Violence:  Not At Risk (01/18/2023)   Humiliation, Afraid, Rape, and Kick questionnaire    Fear of Current or Ex-Partner: No    Emotionally Abused: No    Physically Abused: No    Sexually Abused: No     BP 108/60   Pulse (!) 58   Ht 5' 11.5" (1.816 m)   Wt 154 lb (69.9 kg)   BMI 21.18 kg/m   Physical Exam:  Well appearing NAD HEENT: Unremarkable Neck:  No JVD, no thyromegally Lymphatics:  No adenopathy Back:  No CVA tenderness Lungs:  Clear HEART:  Regular rate rhythm, no murmurs, no rubs, no clicks Abd:  soft, positive bowel sounds, no organomegally, no rebound, no guarding Ext:  2 plus pulses, no edema, no cyanosis, no clubbing Skin:  No rashes no nodules Neuro:  CN II through XII intact, motor grossly intact  EKG - sinus bradycardia    Assess/Plan:  Atrial flutter - he has not had any symptoms since his ablation.  PVC's - these are well controlled on flecainide. Atrial fib- he has not had any since his ablation of atrial flutter. His CHADSVASC score is 1. When he turns 73 I would anticipate starting an OAC. Dizziness - this appears to be stable. No change in meds.   Sharlot Gowda Kelcy Baeten,MD

## 2023-09-29 ENCOUNTER — Other Ambulatory Visit: Payer: Self-pay | Admitting: *Deleted

## 2023-09-29 MED ORDER — FLECAINIDE ACETATE 100 MG PO TABS: ORAL_TABLET | ORAL | 3 refills | Status: DC

## 2023-11-29 ENCOUNTER — Telehealth: Payer: Self-pay | Admitting: Internal Medicine

## 2023-11-29 NOTE — Telephone Encounter (Signed)
 Pt returning call, requesting cb

## 2023-11-29 NOTE — Telephone Encounter (Signed)
Patient c/o Palpitations:  STAT if patient reporting lightheadedness, shortness of breath, or chest pain  How long have you had palpitations/irregular HR/ Afib? Are you having the symptoms now? In/out of afib 3-4 days  Are you currently experiencing lightheadedness, SOB or CP? No  Do you have a history of afib (atrial fibrillation) or irregular heart rhythm? Yes  Have you checked your BP or HR? (document readings if available): No  Are you experiencing any other symptoms? No. Pt going out of town next week and requesting cb to see if he needs to be seen

## 2023-11-29 NOTE — Telephone Encounter (Signed)
Spoke with Pt. Pt states he seems to be going in and off of Afib. He was not in Afib during our phone call and his HR was 60/62 per Pt. Pt is not having symptoms other than feeling the palpations and there was some DOE when walking up a hill with his dog. Pt is calling in as he is going out of town next week for 10 days. Told pt I would review with Dr Ladona Ridgel and see what if any the next steps would be.

## 2023-11-29 NOTE — Telephone Encounter (Signed)
 Left message to call back

## 2023-11-30 ENCOUNTER — Other Ambulatory Visit: Payer: Self-pay

## 2023-11-30 MED ORDER — METOPROLOL TARTRATE 25 MG PO TABS: ORAL_TABLET | ORAL | 3 refills | Status: DC

## 2023-11-30 NOTE — Telephone Encounter (Signed)
Spoke to pt. Told Dr Bruna Potter recommendations including coming in to see Dr Jimmey Ralph for a Afib ablation. Pt stated understanding. Will send message to scheduling.

## 2023-12-01 ENCOUNTER — Encounter: Payer: Self-pay | Admitting: Family Medicine

## 2023-12-01 NOTE — Telephone Encounter (Signed)
Spoke to pt, scheduled visit on 2/19 due to pt going out of town this week until 2/18.

## 2023-12-01 NOTE — Telephone Encounter (Signed)
Please schedule office visit.

## 2023-12-06 ENCOUNTER — Other Ambulatory Visit: Payer: Self-pay

## 2023-12-06 ENCOUNTER — Other Ambulatory Visit (HOSPITAL_COMMUNITY)
Admission: RE | Admit: 2023-12-06 | Discharge: 2023-12-06 | Disposition: A | Discharge status: Home / Self Care | Payer: Self-pay | Source: Ambulatory Visit | Attending: Medical Genetics | Admitting: Medical Genetics

## 2023-12-06 ENCOUNTER — Encounter (HOSPITAL_COMMUNITY): Payer: Self-pay | Admitting: Emergency Medicine

## 2023-12-06 ENCOUNTER — Emergency Department (HOSPITAL_COMMUNITY): Payer: Medicare Other

## 2023-12-06 ENCOUNTER — Observation Stay (HOSPITAL_COMMUNITY)
Admission: EM | Admit: 2023-12-06 | Discharge: 2023-12-07 | Disposition: A | Discharge status: Home / Self Care | Payer: Medicare Other | Attending: Internal Medicine | Admitting: Internal Medicine

## 2023-12-06 DIAGNOSIS — R0602 Shortness of breath: Secondary | ICD-10-CM | POA: Insufficient documentation

## 2023-12-06 DIAGNOSIS — Z79899 Other long term (current) drug therapy: Secondary | ICD-10-CM | POA: Diagnosis not present

## 2023-12-06 DIAGNOSIS — I959 Hypotension, unspecified: Secondary | ICD-10-CM | POA: Diagnosis not present

## 2023-12-06 DIAGNOSIS — I493 Ventricular premature depolarization: Secondary | ICD-10-CM | POA: Diagnosis present

## 2023-12-06 DIAGNOSIS — I498 Other specified cardiac arrhythmias: Secondary | ICD-10-CM

## 2023-12-06 DIAGNOSIS — R002 Palpitations: Principal | ICD-10-CM | POA: Diagnosis present

## 2023-12-06 DIAGNOSIS — D696 Thrombocytopenia, unspecified: Secondary | ICD-10-CM | POA: Diagnosis not present

## 2023-12-06 DIAGNOSIS — I4891 Unspecified atrial fibrillation: Secondary | ICD-10-CM | POA: Diagnosis not present

## 2023-12-06 DIAGNOSIS — R001 Bradycardia, unspecified: Secondary | ICD-10-CM | POA: Diagnosis not present

## 2023-12-06 DIAGNOSIS — R42 Dizziness and giddiness: Principal | ICD-10-CM

## 2023-12-06 DIAGNOSIS — I10 Essential (primary) hypertension: Secondary | ICD-10-CM | POA: Diagnosis not present

## 2023-12-06 DIAGNOSIS — Z006 Encounter for examination for normal comparison and control in clinical research program: Secondary | ICD-10-CM | POA: Insufficient documentation

## 2023-12-06 DIAGNOSIS — I480 Paroxysmal atrial fibrillation: Secondary | ICD-10-CM | POA: Insufficient documentation

## 2023-12-06 LAB — CBC
HCT: 45.2 % (ref 39.0–52.0)
HCT: 40.8 % (ref 39.0–52.0)
Hemoglobin: 15.1 g/dL (ref 13.0–17.0)
Hemoglobin: 13.7 g/dL (ref 13.0–17.0)
MCH: 32.3 pg (ref 26.0–34.0)
MCH: 31.9 pg (ref 26.0–34.0)
MCHC: 33.4 g/dL (ref 30.0–36.0)
MCHC: 33.6 g/dL (ref 30.0–36.0)
MCV: 96.6 fL (ref 80.0–100.0)
MCV: 94.9 fL (ref 80.0–100.0)
Platelets: 154 10*3/uL (ref 150–400)
Platelets: 126 10*3/uL — ABNORMAL LOW (ref 150–400)
RBC: 4.68 MIL/uL (ref 4.22–5.81)
RBC: 4.3 MIL/uL (ref 4.22–5.81)
RDW: 13.3 % (ref 11.5–15.5)
RDW: 13.3 % (ref 11.5–15.5)
WBC: 7.1 10*3/uL (ref 4.0–10.5)
WBC: 5.1 10*3/uL (ref 4.0–10.5)
nRBC: 0 % (ref 0.0–0.2)
nRBC: 0 % (ref 0.0–0.2)

## 2023-12-06 LAB — BASIC METABOLIC PANEL
Anion gap: 9 (ref 5–15)
BUN: 23 mg/dL (ref 8–23)
CO2: 26 mmol/L (ref 22–32)
Calcium: 9.5 mg/dL (ref 8.9–10.3)
Chloride: 105 mmol/L (ref 98–111)
Creatinine, Ser: 0.85 mg/dL (ref 0.61–1.24)
GFR, Estimated: >60 mL/min (ref >60)
Glucose, Bld: 98 mg/dL (ref 70–99)
Potassium: 4.2 mmol/L (ref 3.5–5.1)
Sodium: 140 mmol/L (ref 135–145)

## 2023-12-06 LAB — CREATININE, SERUM
Creatinine, Ser: 0.96 mg/dL (ref 0.61–1.24)
GFR, Estimated: >60 mL/min (ref >60)

## 2023-12-06 LAB — BRAIN NATRIURETIC PEPTIDE: B Natriuretic Peptide: 295.1 pg/mL — ABNORMAL HIGH (ref 0.0–100.0)

## 2023-12-06 LAB — MAGNESIUM: Magnesium: 2.3 mg/dL (ref 1.7–2.4)

## 2023-12-06 MED ORDER — HEPARIN SODIUM (PORCINE) 5000 UNIT/ML IJ SOLN
5000.0000 [IU] | Freq: Three times a day (TID) | INTRAMUSCULAR | Status: DC
  Administered 2023-12-06 – 2023-12-07 (×2): 5000 [IU] via SUBCUTANEOUS
  Filled 2023-12-06 (×2): qty 1

## 2023-12-06 MED ORDER — LACTATED RINGERS IV BOLUS
1000.0000 mL | Freq: Once | INTRAVENOUS | Status: AC
  Administered 2023-12-06: 1000 mL via INTRAVENOUS

## 2023-12-06 MED ORDER — METOPROLOL TARTRATE 25 MG PO TABS: 12.5000 mg | ORAL_TABLET | Freq: Every day | ORAL | Status: DC

## 2023-12-06 MED ORDER — FLECAINIDE ACETATE 50 MG PO TABS
100.0000 mg | ORAL_TABLET | Freq: Two times a day (BID) | ORAL | Status: DC
  Administered 2023-12-06 – 2023-12-07 (×2): 100 mg via ORAL
  Filled 2023-12-06 (×2): qty 2

## 2023-12-06 NOTE — H&P (Signed)
Cardiology Admission History and Physical   Patient ID: Jacob Marsh MRN: [id number]; DOB: 1951-01-30   Admission date: 12/06/2023  PCP:  Judy Pimple, MD   [facility] Providers Cardiologist:  Lewayne Bunting, MD  Electrophysiologist:  Lewayne Bunting, MD       Chief Complaint:  PVCs, dizziness  Patient Profile:   Jacob Marsh is a 73 y.o. male with hx of aflutter s/p ablation, PVCs on flecanide and metoprolol who is being seen 12/06/2023 for the evaluation of dizziness and PVCs.  History of Present Illness:   Jacob Marsh is a 73 yo male who presents to ED with dizziness, noted to be in PVCs in bigeminy. He is receiving fluids in ED and currently feels well but has not attempted ambulation yet. He notes that he has had some increased dizziness over last couple of days, without frank syncope. Notes it is worse than prior and also felt palpitations. Limited his caffeine intake but does feel like he could be dehydrated. Wife pass away, he lives at home alone with his dog.   Felt concerned enough about symptoms today to present to ED. he had intended to take a trip and had overall felt well this morning, walking his dog through his usual hilly route with no significant symptoms.  However later in the day prior to leaving for his trip he felt lightheaded and dizzy with palpitations and was concerned he may have a syncopal episode.  He presented to the ED where he was found to be in bigeminy.  He has not missed any of his medications.  He notes he has been taking flecainide 100 twice daily with an additional 50 mg at noon each day, he is also on metoprolol half a tab once daily with additional as needed as needed.  He is noted to be bradycardic on presentation.  Patient and I had a long discussion about outpatient options.  I have already sent a message to the outpatient scheduler to get him on Dr. Lubertha Basque schedule.  I reviewed his EKG which demonstrates probable conduction system  delay at baseline with very wide QRS PVCs.  He has not had an assessment of his ejection fraction in several years.  We discussed outpatient management versus overnight observation.  In an abundance of caution, patient would prefer to stay overnight.  He lives alone and we agreed that observation and monitoring of blood pressure and heart rate would be reasonable with EP curbside or consultation in the morning for any further recommendations.   Past Medical History:  Diagnosis Date   A-fib (HCC) 10/17/2019   BPH (benign prostatic hyperplasia) 09/15/2016   Mild symptoms     Depression    Diverticulosis    History of diverticulosis// flex sig 2006//colonoscopy 01/21/2000 diverticulosis   Dupuytren contracture 12/29/2017   GERD (gastroesophageal reflux disease)    Globus sensation 07/17/2014   Hx of echocardiogram    Echo (9/14): EF 55%, grade 1 diastolic dysfunction, trivial AI, trivial MR, MAC, PASP 29   Palpitations 08/10/2013   Postural dizziness 03/20/2014   PVC (premature ventricular contraction)    PVCs (premature ventricular contractions) 03/20/2014   Trigger finger of both hands     Past Surgical History:  Procedure Laterality Date   A-FLUTTER ABLATION N/A 11/13/2019   Procedure: A-FLUTTER ABLATION;  Surgeon: Marinus Maw, MD;  Location: [facility];  Service: Cardiovascular;  Laterality: N/A;   DUPUYTREN CONTRACTURE RELEASE     HAND SURGERY Right  08/2020   TONSILLECTOMY     WISDOM TOOTH EXTRACTION       Medications Prior to Admission: Prior to Admission medications   Medication Sig Start Date End Date Taking? Authorizing Provider  acetaminophen (TYLENOL) 500 MG tablet Take 1,000 mg by mouth every 6 (six) hours as needed for moderate pain or headache.    [provider]  b complex vitamins capsule Take 1 capsule by mouth in the morning and at bedtime.    [provider]  Charcoal Activated 280 MG CAPS Take 2 capsules by mouth daily.    [provider]  Cholecalciferol (VITAMIN D-3 PO) Take 200 mcg by mouth daily.    [provider]  flecainide (TAMBOCOR) 100 MG tablet Take 1 tablet by mouth twice daily, may take additional 1/2 tablets daily for break through palpitations 09/29/23   Marinus Maw, MD  MAGNESIUM MALATE PO Take 360 mg by mouth in the morning and at bedtime.    [provider]  Melatonin 1 MG CAPS Take 1.5 capsules by mouth at bedtime.    [provider]  metoprolol tartrate (LOPRESSOR) 25 MG tablet TAKE 1/2 TABLET BY MOUTH EVERY NIGHT AT BEDTIME. MAY TAKE AN ADDITIONAL 1/2 TABLET AS NEEDED FOR PALPITATION 11/30/23   Marinus Maw, MD  Multiple Vitamin (MULTIVITAMIN WITH MINERALS) TABS Take 1 tablet by mouth daily.     [provider]  Omega-3 Fatty Acids (OMEGA-3 FISH OIL PO) Take 345 mg by mouth in the morning and at bedtime.    [provider]  OVER THE COUNTER MEDICATION Take 1 tablet by mouth daily. NEUROMAG (Chelted Magnesium L-Thronate 50mg )    [provider]  OVER THE COUNTER MEDICATION Take 2 capsules by mouth daily. LION'S MANE MUSHROOM (450 MG)    [provider]  OVER THE COUNTER MEDICATION Take 1 capsule by mouth daily. BRAIN VITALE    [provider]  SACCHAROMYCES BOULARDII PO Take 420 mg by mouth in the morning and at bedtime.    [provider]  Selenium 200 MCG CAPS Take 1 capsule by mouth daily.    [provider]  TURMERIC CURCUMIN PO Take 500 mg by mouth in the morning and at bedtime.    [provider]  ZINC PICOLINATE PO Take 25 mg by mouth in the morning and at bedtime.    [provider]     Allergies:   No Known Allergies  Social History:   Social History   Socioeconomic History   Marital status: Widowed    Spouse name: Not on file   Number of children: 3   Years of education: Not on file   Highest education level: Not on file  Occupational History   Occupation: works from  home  Tobacco Use   Smoking status: Never   Smokeless tobacco: Never  Vaping Use   Vaping status: Never Used  Substance and Sexual Activity   Alcohol use: Yes    Alcohol/week: 1.0 standard drink of alcohol    Types: 1 Glasses of wine per week    Comment: occ   Drug use: No   Sexual activity: Yes  Other Topics Concern   Not on file  Social History Narrative   Wife has alzheimer's and is living in a SNF since 2020   Still working as well    Children live in [place], [place], [place], [place], and [place], but they visit each other several times per year   He's  close with his sister who lives nearby   Social Drivers of Health   Financial Resource Strain: Low Risk  (01/16/2022)   Overall Financial Resource Strain (CARDIA)    Difficulty of Paying Living Expenses: Not hard at all  Food Insecurity: No Food Insecurity (12/06/2023)   Hunger Vital Sign    Worried About Running Out of Food in the Last Year: Never true    Ran Out of Food in the Last Year: Never true  Transportation Needs: No Transportation Needs (12/06/2023)   PRAPARE - Administrator, Civil Service (Medical): No    Lack of Transportation (Non-Medical): No  Physical Activity: Sufficiently Active (01/18/2023)   Exercise Vital Sign    Days of Exercise per Week: 3 days    Minutes of Exercise per Session: 60 min  Stress: No Stress Concern Present (01/16/2022)   [employer] of Occupational Health - Occupational Stress Questionnaire    Feeling of Stress : Not at all  Social Connections: Socially Isolated (12/06/2023)   Social Connection and Isolation Panel [NHANES]    Frequency of Communication with Friends and Family: More than three times a week    Frequency of Social Gatherings with Friends and Family: Twice a week    Attends Religious Services: Never    Database administrator or Organizations: No    Attends Banker Meetings: Never    Marital Status: Widowed  Intimate Partner Violence: Not At Risk  (12/06/2023)   Humiliation, Afraid, Rape, and Kick questionnaire    Fear of Current or Ex-Partner: No    Emotionally Abused: No    Physically Abused: No    Sexually Abused: No    Family History:   [name] family history includes Arthritis in his mother; Cancer in his maternal aunt and maternal grandmother; Cancer - Colon in his paternal grandmother; Colon cancer in his paternal grandmother and paternal uncle; Colon polyps in his father; Depression in his sister; Heart disease in his maternal grandfather, maternal grandmother, and mother. There is no history of Sudden death.    ROS:  Please see the history of present illness.  All other ROS reviewed and negative.     Physical Exam/Data:   Vitals:   12/06/23 1915 12/06/23 1929 12/06/23 1930 12/06/23 1945  BP: (!) 123/91  129/82 (!) 149/73  Pulse: 78  (!) 43 72  Resp:    16  Temp:  98.4 F (36.9 C)    TempSrc:      SpO2: 90%  100% 100%    Intake/Output Summary (Last 24 hours) at 12/06/2023 2227 Last data filed at 12/06/2023 1956 Gross per 24 hour  Intake 1000 ml  Output --  Net 1000 ml      09/28/2023    8:31 AM 05/31/2023   10:24 AM 01/18/2023   10:50 AM  Last 3 Weights  Weight (lbs) 154 lb 152 lb 8 oz 152 lb  Weight (kg) 69.854 kg 69.174 kg 68.947 kg     There is no height or weight on file to calculate BMI.  General:  Well nourished, well developed, in no acute distress HEENT: normal Neck: no JVD Vascular: No carotid bruits; Distal pulses 2+ bilaterally   Cardiac:  normal S1, S2; iRRR; no significant murmur Lungs:  clear to auscultation bilaterally, no wheezing, rhonchi or rales  Abd: soft, nontender, no hepatomegaly  Ext: no edema Musculoskeletal:  No deformities, BUE and BLE strength normal and equal Skin: warm and dry  Neuro:  CNs 2-12 intact, no focal abnormalities noted Psych:  Normal affect    EKG:  The ECG that was done today was personally reviewed and demonstrates frequent PVCs and bigeminy with  baseline QRS demonstrating conduction system delay  Relevant CV Studies: No recent, echo pending  Laboratory Data:  High Sensitivity Troponin:  No results for input(s): "TROPONINIHS" in the last 720 hours.    Chemistry Recent Labs  Lab 12/06/23 1517  NA 140  K 4.2  CL 105  CO2 26  GLUCOSE 98  BUN 23  CREATININE 0.85  CALCIUM 9.5  MG 2.3  GFRNONAA >60  ANIONGAP 9    No results for input(s): "PROT", "ALBUMIN", "AST", "ALT", "ALKPHOS", "BILITOT" in the last 168 hours. Lipids No results for input(s): "CHOL", "TRIG", "HDL", "LABVLDL", "LDLCALC", "CHOLHDL" in the last 168 hours. Hematology Recent Labs  Lab 12/06/23 1517 12/06/23 2204  WBC 7.1 5.1  RBC 4.68 4.30  HGB 15.1 13.7  HCT 45.2 40.8  MCV 96.6 94.9  MCH 32.3 31.9  MCHC 33.4 33.6  RDW 13.3 13.3  PLT 154 126*   Thyroid No results for input(s): "TSH", "FREET4" in the last 168 hours. BNP Recent Labs  Lab 12/06/23 1517  BNP 295.1*    DDimer No results for input(s): "DDIMER" in the last 168 hours.   Radiology/Studies:  DG Chest Portable 1 View Result Date: 12/06/2023 CLINICAL DATA:  Palpitations and dizziness EXAM: PORTABLE CHEST 1 VIEW COMPARISON:  08/12/2012 FINDINGS: The heart size and mediastinal contours are within normal limits. Both lungs are clear. The visualized skeletal structures are unremarkable. IMPRESSION: No active disease. Electronically Signed   By: Paulina Fusi M.D.   On: 12/06/2023 18:35     Assessment and Plan:  Principal Problem:   Dizziness  Dizziness and PVCs -In an abundance of caution, the patient and I participated in shared decision making and patient would prefer to spend the night in the hospital and if possible EP consultation or at least opinion rendered curbside tomorrow regarding continued management.  He lives alone and was planning to [place] home, which we did not feel was optimal while he remained dizzy.  He is being volume resuscitated currently, would finish the liter bag of  LR he is receiving.  He does take flecainide but with evidence of QRS widening at baseline, I discussed this with EP who felt outpatient adjustment in flecainide was most appropriate.  Cannot titrate beta-blocker due to bradycardia.  He does not appear to be in low output heart failure nor does he appear grossly volume overloaded.  We will however recheck an echocardiogram given that dizziness and increased frequency of PVCs to ensure LV function is normal.  He has not had an echocardiogram in over 10 years. -For now continue flecainide at current dosing as well as metoprolol. -History of atrial flutter but does not appear to be on anticoagulation at this time.   Risk Assessment/Risk Scores:            Code Status: Full Code  Severity of Illness: The appropriate patient status for this patient is OBSERVATION. Observation status is judged to be reasonable and necessary in order to provide the required intensity of service to ensure the patient's safety. [name] presenting symptoms, physical exam findings, and initial radiographic and laboratory data in the context of their medical condition is felt to place them at decreased risk for further clinical deterioration. Furthermore, it is anticipated that the patient will be medically stable for discharge from the  hospital within 2 midnights of admission.    For questions or updates, please contact [facility] Please consult [email or web address] for contact info under     Signed, Parke Poisson, MD  12/06/2023 10:27 PM

## 2023-12-06 NOTE — ED Notes (Signed)
CCMD called.

## 2023-12-06 NOTE — ED Provider Notes (Signed)
Jacob Marsh EMERGENCY DEPARTMENT AT [facility] Provider Note   CSN: [id number] Arrival date & time: 12/06/23  1448     History {Add pertinent medical, surgical, social history, OB history to HPI:1} Chief Complaint  Patient presents with   Dizziness    Jacob Marsh is a 73 y.o. male.  73 year old male with history of atrial fibrillation and PVCs on flecainide and metoprolol who presents to the emergency department with palpitations.  Patient reports that this morning he started feeling palpitations.  Also started feeling lightheaded.  No chest pain.  Says she has had some mild shortness of breath for the past month but no changes recently.  No diaphoresis or nausea or vomiting.  No personal history of MI.  Not on blood thinners.       Home Medications Prior to Admission medications   Medication Sig Start Date End Date Taking? Authorizing Provider  acetaminophen (TYLENOL) 500 MG tablet Take 1,000 mg by mouth every 6 (six) hours as needed for moderate pain or headache.    [provider]  b complex vitamins capsule Take 1 capsule by mouth in the morning and at bedtime.    [provider]  Charcoal Activated 280 MG CAPS Take 2 capsules by mouth daily.    [provider]  Cholecalciferol (VITAMIN D-3 PO) Take 200 mcg by mouth daily.    [provider]  flecainide (TAMBOCOR) 100 MG tablet Take 1 tablet by mouth twice daily, may take additional 1/2 tablets daily for break through palpitations 09/29/23   Marinus Maw, MD  MAGNESIUM MALATE PO Take 360 mg by mouth in the morning and at bedtime.    [provider]  Melatonin 1 MG CAPS Take 1.5 capsules by mouth at bedtime.    [provider]  metoprolol tartrate (LOPRESSOR) 25 MG tablet TAKE 1/2 TABLET BY MOUTH EVERY NIGHT AT BEDTIME. MAY TAKE AN ADDITIONAL 1/2 TABLET AS NEEDED FOR PALPITATION 11/30/23   Marinus Maw, MD  Multiple Vitamin (MULTIVITAMIN WITH MINERALS) TABS  Take 1 tablet by mouth daily.     [provider]  Omega-3 Fatty Acids (OMEGA-3 FISH OIL PO) Take 345 mg by mouth in the morning and at bedtime.    [provider]  OVER THE COUNTER MEDICATION Take 1 tablet by mouth daily. NEUROMAG (Chelted Magnesium L-Thronate 50mg )    [provider]  OVER THE COUNTER MEDICATION Take 2 capsules by mouth daily. LION'S MANE MUSHROOM (450 MG)    [provider]  OVER THE COUNTER MEDICATION Take 1 capsule by mouth daily. BRAIN VITALE    [provider]  SACCHAROMYCES BOULARDII PO Take 420 mg by mouth in the morning and at bedtime.    [provider]  Selenium 200 MCG CAPS Take 1 capsule by mouth daily.    [provider]  TURMERIC CURCUMIN PO Take 500 mg by mouth in the morning and at bedtime.    [provider]  ZINC PICOLINATE PO Take 25 mg by mouth in the morning and at bedtime.    [provider]      Allergies    Patient has no known allergies.    Review of Systems   Review of Systems  Physical Exam Updated Vital Signs BP (!) 119/53   Pulse 62   Temp 98.4 F (36.9 C) (Oral)   Resp 17   SpO2 98%  Physical Exam Vitals and nursing note reviewed.  Constitutional:  General: He is not in acute distress.    Appearance: He is well-developed.  HENT:     Head: Normocephalic and atraumatic.     Right Ear: External ear normal.     Left Ear: External ear normal.     Nose: Nose normal.  Eyes:     Extraocular Movements: Extraocular movements intact.     Conjunctiva/sclera: Conjunctivae normal.     Pupils: Pupils are equal, round, and reactive to light.  Cardiovascular:     Rate and Rhythm: Normal rate. Rhythm irregular.     Heart sounds: Normal heart sounds.  Pulmonary:     Effort: Pulmonary effort is normal. No respiratory distress.     Breath sounds: Normal breath sounds.  Musculoskeletal:     Cervical back: Normal range of motion and neck supple.     Right lower  leg: No edema.     Left lower leg: No edema.  Skin:    General: Skin is warm and dry.  Neurological:     Mental Status: He is alert. Mental status is at baseline.  Psychiatric:        Mood and Affect: Mood normal.        Behavior: Behavior normal.     ED Results / Procedures / Treatments   Labs (all labs ordered are listed, but only abnormal results are displayed) Labs Reviewed  BASIC METABOLIC PANEL  CBC  BRAIN NATRIURETIC PEPTIDE  MAGNESIUM    EKG None  Radiology No results found.  Procedures Procedures  {Document cardiac monitor, telemetry assessment procedure when appropriate:1}  Medications Ordered in ED Medications - No data to display  ED Course/ Medical Decision Making/ A&P Clinical Course as of 12/07/23 1437  Mon Dec 06, 2023  1831 Dr Jacques Navy [RP]    Clinical Course User Index [RP] Rondel Baton, MD   {   Click here for ABCD2, HEART and other calculatorsREFRESH Note before signing :1}                              Medical Decision Making Amount and/or Complexity of Data Reviewed Labs: ordered. Radiology: ordered.  Risk Decision regarding hospitalization.   ***  {Document critical care time when appropriate:1} {Document review of labs and clinical decision tools ie heart score, Chads2Vasc2 etc:1}  {Document your independent review of radiology images, and any outside records:1} {Document your discussion with family members, caretakers, and with consultants:1} {Document social determinants of health affecting pt's care:1} {Document your decision making why or why not admission, treatments were needed:1} Final Clinical Impression(s) / ED Diagnoses Final diagnoses:  None    Rx / DC Orders ED Discharge Orders     None

## 2023-12-06 NOTE — ED Triage Notes (Signed)
Pt BIB [facility] with reports of dizziness and weakness.

## 2023-12-07 ENCOUNTER — Inpatient Hospital Stay (HOSPITAL_BASED_OUTPATIENT_CLINIC_OR_DEPARTMENT_OTHER)
Admit: 2023-12-07 | Discharge: 2023-12-07 | Disposition: A | Discharge status: Home / Self Care | Payer: PRIVATE HEALTH INSURANCE | Attending: Physician Assistant | Admitting: Physician Assistant

## 2023-12-07 ENCOUNTER — Observation Stay (HOSPITAL_COMMUNITY): Payer: Medicare Other

## 2023-12-07 DIAGNOSIS — R42 Dizziness and giddiness: Secondary | ICD-10-CM

## 2023-12-07 DIAGNOSIS — I5021 Acute systolic (congestive) heart failure: Secondary | ICD-10-CM

## 2023-12-07 DIAGNOSIS — I480 Paroxysmal atrial fibrillation: Secondary | ICD-10-CM

## 2023-12-07 DIAGNOSIS — I493 Ventricular premature depolarization: Secondary | ICD-10-CM

## 2023-12-07 LAB — CBC
HCT: 41.9 % (ref 39.0–52.0)
Hemoglobin: 14 g/dL (ref 13.0–17.0)
MCH: 32 pg (ref 26.0–34.0)
MCHC: 33.4 g/dL (ref 30.0–36.0)
MCV: 95.7 fL (ref 80.0–100.0)
Platelets: 129 10*3/uL — ABNORMAL LOW (ref 150–400)
RBC: 4.38 MIL/uL (ref 4.22–5.81)
RDW: 13.2 % (ref 11.5–15.5)
WBC: 6.7 10*3/uL (ref 4.0–10.5)
nRBC: 0 % (ref 0.0–0.2)

## 2023-12-07 LAB — ECHOCARDIOGRAM COMPLETE
AR max vel: 1.58 cm2
AV Area VTI: 1.55 cm2
AV Area mean vel: 1.4 cm2
AV Mean grad: 4 mm[Hg]
AV Peak grad: 7.9 mm[Hg]
Ao pk vel: 1.41 m/s
Area-P 1/2: 2.06 cm2
Calc EF: 66.7 %
MV VTI: 1.57 cm2
S' Lateral: 2.9 cm
Single Plane A2C EF: 71.9 %
Single Plane A4C EF: 66.5 %

## 2023-12-07 LAB — BASIC METABOLIC PANEL
Anion gap: 8 (ref 5–15)
BUN: 16 mg/dL (ref 8–23)
CO2: 24 mmol/L (ref 22–32)
Calcium: 9 mg/dL (ref 8.9–10.3)
Chloride: 109 mmol/L (ref 98–111)
Creatinine, Ser: 0.88 mg/dL (ref 0.61–1.24)
GFR, Estimated: >60 mL/min (ref >60)
Glucose, Bld: 81 mg/dL (ref 70–99)
Potassium: 3.7 mmol/L (ref 3.5–5.1)
Sodium: 141 mmol/L (ref 135–145)

## 2023-12-07 MED ORDER — FLECAINIDE ACETATE 100 MG PO TABS: 100.0000 mg | ORAL_TABLET | Freq: Two times a day (BID) | ORAL | Status: DC

## 2023-12-07 MED ORDER — POTASSIUM CHLORIDE CRYS ER 20 MEQ PO TBCR
20.0000 meq | EXTENDED_RELEASE_TABLET | Freq: Once | ORAL | Status: AC
  Administered 2023-12-07: 20 meq via ORAL
  Filled 2023-12-07: qty 1

## 2023-12-07 NOTE — ED Notes (Signed)
Pt d/c home per EDP order. Discharge summary reviewed, pt verbalizes understanding , Ambulatory off unit. NAD

## 2023-12-07 NOTE — Progress Notes (Signed)
  Echocardiogram 2D Echocardiogram has been performed.  Ocie Doyne RDCS 12/07/2023, 9:12 AM

## 2023-12-07 NOTE — Evaluation (Addendum)
Physical Therapy Evaluation and Discharge Patient Details Name: Jacob Marsh MRN: [id number] DOB: 01-31-1951 Today's Date: 12/07/2023  History of Present Illness  Patient is a 73 year old male presenting with dizziness, noted to have PVCs in bigeminy. History of A-fib, PVC, postural dizziness, palpitations.  Clinical Impression  Patient is agreeable to PT evaluation. He reports he has noticed a change with high level balance activities for a while now, but he is ambulatory without assistive device and walks his dog around 2 miles daily.  Today, the patient is independent/Modified independent with mobility. He walked in the hallway without assistive device. Very mild dizziness reported. One mild staggering to the right with head turn to the left with dynamic activity. Consider outpatient PT follow up for higher level balance activity. No apparent acute PT needs at this time. PT will sign off.   Orthostatic vitals: BP supine: 102/79, HR supine: 62 BP sitting: 93/62, HR sitting: 63 BP standing 1 minute: 100/60, HR standing: 65 BP standing 3 minutes: 106/80, HR standing: 64    If plan is discharge home, recommend the following: Assist for transportation   Can travel by private vehicle        Equipment Recommendations None recommended by PT  Recommendations for Other Services       Functional Status Assessment Patient has not had a recent decline in their functional status     Precautions / Restrictions Precautions Precautions: Fall Restrictions Weight Bearing Restrictions Per Provider Order: No      Mobility  Bed Mobility Overal bed mobility: Independent                  Transfers Overall transfer level: Independent                      Ambulation/Gait Ambulation/Gait assistance: Modified independent (Device/Increase time) (increased time) Gait Distance (Feet): 200 Feet Assistive device: None Gait Pattern/deviations: Step-through pattern, Staggering  right Gait velocity: decreased     General Gait Details: one stagger to the right that is self corrected when turning his head to the left. otherwise, WFL with mildly decreased gait velocity. mild dizziness is reported  Acupuncturist Bed    Modified Rankin (Stroke Patients Only)       Balance Overall balance assessment: Needs assistance Sitting-balance support: Feet supported Sitting balance-Leahy Scale: Normal     Standing balance support: During functional activity Standing balance-Leahy Scale: Good Standing balance comment: good to fair. patient able to reach outside base of support without difficulty with no loss of balance. he completed all ADLs in the bathroom independently. one staggering bout that is self corrected with ambulation                             Pertinent Vitals/Pain      Home Living Family/patient expects to be discharged to:: Private residence Living Arrangements: Alone Available Help at Discharge: Family Type of Home: House         Home Layout: One level        Prior Function Prior Level of Function : Independent/Modified Independent;Driving             Mobility Comments: walks his dog around 2 miles per day. independent with mobility       Extremity/Trunk Assessment   Upper Extremity Assessment Upper Extremity Assessment: Overall  WFL for tasks assessed    Lower Extremity Assessment Lower Extremity Assessment: Overall WFL for tasks assessed       Communication   Communication Communication: No apparent difficulties    Cognition Arousal: Alert Behavior During Therapy: WFL for tasks assessed/performed   PT - Cognitive impairments: No apparent impairments                         Following commands: Intact       Cueing       General Comments General comments (skin integrity, edema, etc.): orthostatic vitals taken during session    Exercises      Assessment/Plan    PT Assessment All further PT needs can be met in the next venue of care  PT Problem List         PT Treatment Interventions      PT Goals (Current goals can be found in the Care Plan section)  Acute Rehab PT Goals PT Goal Formulation: All assessment and education complete, DC therapy    Frequency       Co-evaluation               AM-PAC PT "6 Clicks" Mobility  Outcome Measure Help needed turning from your back to your side while in a flat bed without using bedrails?: None Help needed moving from lying on your back to sitting on the side of a flat bed without using bedrails?: None Help needed moving to and from a bed to a chair (including a wheelchair)?: None Help needed standing up from a chair using your arms (e.g., wheelchair or bedside chair)?: None Help needed to walk in hospital room?: None Help needed climbing 3-5 steps with a railing? : None 6 Click Score: 24    End of Session   Activity Tolerance: Patient tolerated treatment well Patient left: in bed;with call bell/phone within reach Nurse Communication: Mobility status      Time: 4098-1191 PT Time Calculation (min) (ACUTE ONLY): 20 min   Charges:   PT Evaluation $PT Eval Low Complexity: 1 Low   PT General Charges $$ ACUTE PT VISIT: 1 Visit         Donna Bernard, PT, MPT  In[facility] 12/07/2023, 10:18 AM

## 2023-12-07 NOTE — Consult Note (Addendum)
Cardiology Consultation   Patient ID: Jacob Marsh MRN: [id number]; DOB: 18-May-1951  Admit date: 12/06/2023 Date of Consult: 12/07/2023  PCP:  Jacob Pimple, MD   Taylor Marsh Providers Cardiologist:  Jacob Bunting, MD  Electrophysiologist:  Jacob Bunting, MD  {    Patient Profile:   Jacob Marsh is a 73 y.o. male with a hx of AFib (persistent), Aflutter (ablated 2021), PVCs who is being seen 12/07/2023 for the evaluation of PVCs at the request of Jacob Marsh.  Flecainide goes back to 2015  History of Present Illness:   Jacob Marsh last saw JacobTaylor 09/28/23, had been just over a year sinec his last visit, reported no symptoms, on BB/flecainide for AFib and PVCs With risk score of one (and presumed low burden) not on [facility] >> though discussed when he turned 73 would need to consider OAC There was mention of dizziness, described as stable No changes were made.  Flecainide 100mg BID (with an additional 50mg  daily as needed for palpitations) Lopressor 12.5mg  at HS (with an additional PRN dose daily if needed)  He came to the ER with c/o dizziness. More of late reported > found in V bigeminy. Reported feeling well of late, had walked the dog earlier in the day without difficulty towards afternoon, later in the day started to feel lightheaded, no syncope, but worrisome and came in. At the time of cardiology assessment he had gotten IVF, was feeling better, though not ambulate yet. Pt felt best about obs overnight prior to discharge Planned for updated EKG BP stable  LABS K+ 4.2 > 3.7 Mag 2.3 BUN/Creat 23/0.85 > 0.8 BNP 295 WBC 6.7 H/H 14/41 Plts 129  TTE with LVEF 50-55%, global hypokinesis, , RV OK, no significant VHD  He reports that 2 weeks ago noted an up tick in palpitations and started regularly using his PRN mid-day dose of flecainide along with his 100mg  BID 2 days ago started to feel weak, felt like he was perhaps having AFib generally not feeling well,  reports PVCs dont tend to make him feel a poorly so thought was AFib. Yesterday he walked his dog as usual, but later in the day progressively weaker, more lightheaded, and feeling too poorly to drive, called EMS He did not faint  No CP yesterday or of late No changes of late, no recent illness, fever, no missed meds No clear triggers to an escalation of his symptoms/ectopy    Past Medical History:  Diagnosis Date   A-fib (HCC) 10/17/2019   BPH (benign prostatic hyperplasia) 09/15/2016   Mild symptoms     Depression    Diverticulosis    History of diverticulosis// flex sig 2006//colonoscopy 01/21/2000 diverticulosis   Dupuytren contracture 12/29/2017   GERD (gastroesophageal reflux disease)    Globus sensation 07/17/2014   Hx of echocardiogram    Echo (9/14): EF 55%, grade 1 diastolic dysfunction, trivial AI, trivial MR, MAC, PASP 29   Palpitations 08/10/2013   Postural dizziness 03/20/2014   PVC (premature ventricular contraction)    PVCs (premature ventricular contractions) 03/20/2014   Trigger finger of both hands     Past Surgical History:  Procedure Laterality Date   A-FLUTTER ABLATION N/A 11/13/2019   Procedure: A-FLUTTER ABLATION;  Surgeon: Jacob Maw, MD;  Location: [facility] INVASIVE CV LAB;  Service: Cardiovascular;  Laterality: N/A;   DUPUYTREN CONTRACTURE RELEASE     HAND SURGERY Right 08/2020   TONSILLECTOMY     WISDOM TOOTH EXTRACTION  Home Medications:  Prior to Admission medications   Medication Sig Start Date End Date Taking? Authorizing Provider  acetaminophen (TYLENOL) 500 MG tablet Take 1,000 mg by mouth every 6 (six) hours as needed for moderate pain or headache.   Yes [provider]  b complex vitamins capsule Take 1 capsule by mouth in the morning and at bedtime.   Yes [provider]  Charcoal Activated 280 MG CAPS Take 2 capsules by mouth daily.   Yes [provider]  Cholecalciferol (VITAMIN D-3 PO) Take 200 mcg by mouth  daily.   Yes [provider]  flecainide (TAMBOCOR) 100 MG tablet Take 1 tablet by mouth twice daily, may take additional 1/2 tablets daily for break through palpitations Patient taking differently: Take 50-100 mg by mouth See admin instructions. Take 1 tablet (100mg ) in the morning, 1/2 tablet (50mg ) at noon, and 1 tablet (100mg ) in the evening for a total daily dose of 250mg . 09/29/23  Yes Jacob Maw, MD  MAGNESIUM MALATE PO Take 360 mg by mouth in the morning and at bedtime.   Yes [provider]  Melatonin 1 MG CAPS Take 1.5 capsules by mouth at bedtime.   Yes [provider]  metoprolol tartrate (LOPRESSOR) 25 MG tablet TAKE 1/2 TABLET BY MOUTH EVERY NIGHT AT BEDTIME. MAY TAKE AN ADDITIONAL 1/2 TABLET AS NEEDED FOR PALPITATION 11/30/23  Yes Jacob Maw, MD  Multiple Vitamin (MULTIVITAMIN WITH MINERALS) TABS Take 1 tablet by mouth daily.    Yes [provider]  Omega-3 Fatty Acids (OMEGA-3 FISH OIL PO) Take 345 mg by mouth in the morning and at bedtime.   Yes [provider]  OVER THE COUNTER MEDICATION Take 1 tablet by mouth daily. NEUROMAG (Chelted Magnesium L-Thronate 50mg )   Yes [provider]  OVER THE COUNTER MEDICATION Take 2 capsules by mouth daily. LION'S MANE MUSHROOM (450 MG)   Yes [provider]  OVER THE COUNTER MEDICATION Take 1 capsule by mouth daily. BRAIN VITALE   Yes [provider]  SACCHAROMYCES BOULARDII PO Take 420 mg by mouth in the morning and at bedtime.   Yes [provider]  Selenium 200 MCG CAPS Take 1 capsule by mouth daily.   Yes [provider]  TURMERIC CURCUMIN PO Take 500 mg by mouth in the morning and at bedtime.   Yes [provider]  ZINC PICOLINATE PO Take 25 mg by mouth in the morning and at bedtime.   Yes [provider]    Inpatient Medications: Scheduled Meds:  flecainide  100 mg Oral Q12H   heparin  5,000 Units Subcutaneous Q8H    metoprolol tartrate  12.5 mg Oral QHS   Continuous Infusions:  PRN Meds:   Allergies:   No Known Allergies  Social History:   Social History   Socioeconomic History   Marital status: Widowed    Spouse name: Not on file   Number of children: 3   Years of education: Not on file   Highest education level: Not on file  Occupational History   Occupation: works from home  Tobacco Use   Smoking status: Never   Smokeless tobacco: Never  Vaping Use   Vaping status: Never Used  Substance and Sexual Activity   Alcohol use: Yes    Alcohol/week: 1.0 standard drink of alcohol    Types: 1 Glasses of wine per week    Comment: occ   Drug use: No   Sexual activity: Yes  Other Topics Concern   Not on file  Social History Narrative   Wife has alzheimer's and is living in a SNF since 2020   Still working as well    Children live in [place], [place], [place], [place], and [place], but they visit each other several times per year   He's close with his sister who lives nearby   Social Drivers of [employer] Strain: Low Risk  (01/16/2022)   Overall Financial Resource Strain (CARDIA)    Difficulty of Paying Living Expenses: Not hard at all  Food Insecurity: No Food Insecurity (12/06/2023)   Hunger Vital Sign    Worried About Running Out of Food in the Last Year: Never true    Ran Out of Food in the Last Year: Never true  Transportation Needs: No Transportation Needs (12/06/2023)   PRAPARE - Administrator, Civil Service (Medical): No    Lack of Transportation (Non-Medical): No  Physical Activity: Sufficiently Active (01/18/2023)   Exercise Vital Sign    Days of Exercise per Week: 3 days    Minutes of Exercise per Session: 60 min  Stress: No Stress Concern Present (01/16/2022)   Jacob Marsh    Feeling of Stress : Not at all  Social Connections: Socially Isolated (12/06/2023)   Social Connection and Isolation  Panel [NHANES]    Frequency of Communication with Friends and Family: More than three times a week    Frequency of Social Gatherings with Friends and Family: Twice a week    Attends Religious Services: Never    Database administrator or Organizations: No    Attends Banker Meetings: Never    Marital Status: Widowed  Intimate Partner Violence: Not At Risk (12/06/2023)   Humiliation, Afraid, Rape, and Kick Marsh    Fear of Current or Ex-Partner: No    Emotionally Abused: No    Physically Abused: No    Sexually Abused: No    Family History:   Family History  Problem Relation Age of Onset   Arthritis Mother    Heart disease Mother        ? atrial fib   Colon polyps Father    Depression Sister    Heart disease Maternal Grandmother        CAD   Cancer Maternal Grandmother        liver cancer   Heart disease Maternal Grandfather        CAD   Colon cancer Paternal Grandmother    Cancer - Colon Paternal Grandmother    Cancer Maternal Aunt        ?colon cancer   Colon cancer Paternal Uncle    Sudden death Neg Hx      ROS:  Please see the history of present illness.  All other ROS reviewed and negative.     Physical Exam/Data:   Vitals:   12/07/23 0700 12/07/23 0730 12/07/23 0800 12/07/23 0930  BP: 104/71 104/65 111/62 (!) 119/56  Pulse: (!) 52 (!) 44 (!) 53 66  Resp: 15 18 16  (!) 21  Temp:      TempSrc:      SpO2: 100% 100% 99% 100%    Intake/Output Summary (Last 24 hours) at 12/07/2023 1033 Last data filed at 12/07/2023 0814 Gross per 24 hour  Intake 1000 ml  Output 1250 ml  Net -250 ml      09/28/2023    8:31 AM 05/31/2023  10:24 AM 01/18/2023   10:50 AM  Last 3 Weights  Weight (lbs) 154 lb 152 lb 8 oz 152 lb  Weight (kg) 69.854 kg 69.174 kg 68.947 kg     There is no height or weight on file to calculate BMI.  General:  Well nourished, well developed, in no acute distress HEENT: normal Neck: no JVD Vascular: No carotid bruits; Distal  pulses 2+ bilaterally Cardiac:  RRR; no murmurs, gallops or rubs Lungs:  CTA b/l, no wheezing, rhonchi or rales  Abd: soft, nontender  Ext: no edema Musculoskeletal:  No deformities Skin: warm and dry  Neuro:  no focal abnormalities noted Psych:  Normal affect   EKG:  The EKG was personally reviewed and demonstrates:   Significant artifact SR, V bigeminy, 60bpm, PR , RBBB QRS , QTc, difficult to assess well with PVCs, artifact  OLD 09/28/23: SB 58bpm, PR , RBBB, LAD, QRS , QTc , no PVCs  2023: SB 59, PR , RBBB, LAD, QRS , QTc , no PVCs 2022: SB 54, PR , RBBB, LAD, QRS , QTc , no PVCs 2021: SB 49bpm, PR , RBBB QRS ,   Telemetry:  Telemetry was personally reviewed and demonstrates:   SR/ SB 50's mostly, PVCs are frequent at times, quite others  Relevant CV Studies:  12/07/23: TTE 1. Very difficult study with poor windows for TTE. LVEF appears low  normal 50-55%.   2. Left ventricular ejection fraction, by estimation, is 50 to 55%. The  left ventricle has low normal function. The left ventricle demonstrates  global hypokinesis. Left ventricular diastolic function could not be  evaluated.   3. Right ventricular systolic function is normal. The right ventricular  size is normal. Tricuspid regurgitation signal is inadequate for assessing  PA pressure.   4. The mitral valve is grossly normal. Mild mitral valve regurgitation.  No evidence of mitral stenosis.   5. The aortic valve is grossly normal. Aortic valve regurgitation is not  visualized. No aortic stenosis is present.   6. The inferior vena cava is dilated in size with >50% respiratory  variability, suggesting right atrial pressure of 8 mmHg.   Laboratory Data:  High Sensitivity Troponin:  No results for input(s): "TROPONINIHS" in the last 720 hours.   Chemistry Recent Labs  Lab 12/06/23 1517 12/06/23 2204 12/07/23 0357  NA 140  --  141  K 4.2   --  3.7  CL 105  --  109  CO2 26  --  24  GLUCOSE 98  --  81  BUN 23  --  16  CREATININE 0.85 0.96 0.88  CALCIUM 9.5  --  9.0  MG 2.3  --   --   GFRNONAA >60 >60 >60  ANIONGAP 9  --  8    No results for input(s): "PROT", "ALBUMIN", "AST", "ALT", "ALKPHOS", "BILITOT" in the last 168 hours. Lipids No results for input(s): "CHOL", "TRIG", "HDL", "LABVLDL", "LDLCALC", "CHOLHDL" in the last 168 hours.  Hematology Recent Labs  Lab 12/06/23 1517 12/06/23 2204 12/07/23 0357  WBC 7.1 5.1 6.7  RBC 4.68 4.30 4.38  HGB 15.1 13.7 14.0  HCT 45.2 40.8 41.9  MCV 96.6 94.9 95.7  MCH 32.3 31.9 32.0  MCHC 33.4 33.6 33.4  RDW 13.3 13.3 13.2  PLT 154 126* 129*   Thyroid No results for input(s): "TSH", "FREET4" in the last 168 hours.  BNP Recent Labs  Lab 12/06/23 1517  BNP 295.1*    DDimer No results for  input(s): "DDIMER" in the last 168 hours.   Radiology/Studies:   DG Chest Portable 1 View Result Date: 12/06/2023 CLINICAL DATA:  Palpitations and dizziness EXAM: PORTABLE CHEST 1 VIEW COMPARISON:  08/12/2012 FINDINGS: The heart size and mediastinal contours are within normal limits. Both lungs are clear. The visualized skeletal structures are unremarkable. IMPRESSION: No active disease. Electronically Signed   By: Jacob Fusi M.D.   On: 12/06/2023 18:35     Assessment and Plan:   Lightheaded, dizzy, weakness PVCs Paroxysmal Afib  No AFib here though he has suspected of late he has felt like he has been having AFib by his symptoms  Has conduction system disease ( though has stable intervals) Using the mid-day flecainide 50mg  dose (plus his 100mg  BID) for the last 2 weeks with increased palpitations No syncope  Suspect flecainide is failing and likely needs a new AAD Most effective alternative would be amiodarone Mexiletine Jacob Marsh not address his AFib (which he thinks he has been having of late) Tikosyn Jacob Marsh not address his PVCs (his burden of PVCs is a bit worrisome for  Tikosyn)  CHA2DS2Vasc score is one, not currently on [facility]  Jacob Marsh has seen the patient (His daughter in law on speaker phone who is an anesthesiologist) Discussed management strategies: -- changing flecainide to an alternative AAD >> likely Amiodarone discussed potential off target effects -- no change to meds, see dr. Ladona Ridgel out pt, discharge with a monitor  He plans travel and uncomfortable making a med change while traveling Feels most comfortable making no changes to his meds, wearing a monitor and seeing EP team out patient.  I have ordered the monitor and talked to EKG dept to place the monitor I Jacob Marsh make EP follow up  With his conduction system disease, we have advised that he NOT take the additional mid-day 50mg  flecainide dose> he understands  I have mad attending team aware (via secure chat) ok to discharge once monitor placed from EP perspective. (He Danni Shima see Dr. Jimmey Ralph 2/28... previously scheduled for consideration for ablation for  PVCs, and or AFib it seems)    Risk Assessment/Risk Scores:     For questions or updates, please contact Jacob Marsh Please consult [email or web address] for contact info under    Signed, Jacob Pigeon, Jacob Marsh  12/07/2023 10:33 AM  I have seen and examined this patient with Jacob Marsh.  Agree with above, note added to reflect my findings.  Patient has a history of atrial fibrillation/flutter and PVCs.  He is currently on flecainide for PVCs.  He was feeling quite poorly over the last few days and presented to the emergency room.  In the emergency room, he was found to have multiple PVCs with a high burden.  Prior to presenting in the emergency room, he had significant fatigue, lightheadedness, dizziness.  He felt that he could not drive a car.  Currently, he feels improved.  GEN: Well nourished, well developed, in no acute distress  HEENT: normal  Neck: no JVD, carotid bruits, or masses Cardiac: RRR; no murmurs, rubs, or  gallops,no edema  Respiratory:  clear to auscultation bilaterally, normal work of breathing GI: soft, nontender, nondistended, + BS MS: no deformity or atrophy  Skin: warm and dry Neuro:  Strength and sensation are intact Psych: euthymic mood, full affect   PVCs Atrial fibrillation  Patient has had multiple different arrhythmias.  Suspect that he is currently failing flecainide.  Additionally, he does have conduction system disease with a widened QRS.  He would likely benefit from an alternative antiarrhythmic, though has been somewhat bradycardic.  I discussed with him options which include amiodarone versus mexiletine.  If he does choose mexiletine and PVCs are suppressed, could have atrial fibrillation ablation.  For now, the patient would like to continue with his current medications.  Eddie Payette plan for 2-week monitor to reassess overall PVC burden and A-fib burden.  Anagabriela Jokerst M. Nakeem Murnane MD 12/07/2023 1:54 PM

## 2023-12-07 NOTE — Discharge Summary (Addendum)
 Discharge Summary    Patient ID: Jacob Marsh MRN: [id number]; DOB: 05-Dec-1950  Admit date: 12/06/2023 Discharge date: 12/07/2023  PCP:  Judy Pimple, MD   [facility] Providers Cardiologist:  Lewayne Bunting, MD  Electrophysiologist:  Lewayne Bunting, MD    Discharge Diagnoses    Principal Problem:   Dizziness Active Problems:   Thrombocytopenia (HCC)   Palpitations   PVCs (premature ventricular contractions)    Diagnostic Studies/Procedures    Echocardiogram 12/07/23  1. Very difficult study with poor windows for TTE. LVEF appears low  normal 50-55%.   2. Left ventricular ejection fraction, by estimation, is 50 to 55%. The  left ventricle has low normal function. The left ventricle demonstrates  global hypokinesis. Left ventricular diastolic function could not be  evaluated.   3. Right ventricular systolic function is normal. The right ventricular  size is normal. Tricuspid regurgitation signal is inadequate for assessing  PA pressure.   4. The mitral valve is grossly normal. Mild mitral valve regurgitation.  No evidence of mitral stenosis.   5. The aortic valve is grossly normal. Aortic valve regurgitation is not  visualized. No aortic stenosis is present.   6. The inferior vena cava is dilated in size with >50% respiratory  variability, suggesting right atrial pressure of 8 mmHg.  _____________   History of Present Illness     Jacob Marsh is a 73 y.o. male with hx of aflutter s/p ablation, PVCs on flecanide and metoprolol.   Per HPI 2/10:   Jacob Marsh is a 73 yo male who presented to the ED on 2/10 with dizziness. Found to have PVCs in bigeminy pattern. He was given fluids in ED and had improvement in symptoms. He noted that he had been having some increased dizziness over last couple of days, without frank syncope. Noted that his symptoms were more significant that in the past, and he also felt palpitations. He had limited his caffeine intake, did  feel like he could have been dehydrated. Wife passed away, he lives at home alone with his dog.    Patient felt concerned enough about symptoms to present to ED. He had intended to take a trip and had overall felt well that morning, walking his dog through his usual hilly route with no significant symptoms.  However later in the day, prior to leaving for his trip, he felt lightheaded and dizzy with palpitations and was concerned he may have a syncopal episode.  He presented to the ED where he was found to be in bigeminy.  He had not missed any of his medications.  He noted that he had been taking flecainide 100 twice daily with an additional 50 mg at noon each day, as well as metoprolol half a tab once daily with additional as needed as needed.  He was noted to be bradycardic on presentation.   Patient and Dr. Jacques Navy had a long discussion about outpatient options.  Dr. Jacques Navy sent a message to the outpatient scheduler to get him on Dr. Lubertha Basque schedule.  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  He had not had an assessment of his ejection fraction in several years.  We discussed outpatient management versus overnight observation.  In an abundance of caution, patient preferred to stay overnight.  He lives alone and we agreed that observation and monitoring of blood pressure and heart rate would be reasonable with EP curbside or consultation in  the morning for any further recommendations.  Hospital Course     Consultants: EP   Lightheadedness  Dizziness  Weakness PVCs  Paroxysmal Atrial Fibrillation  -Patient presented to the ED on 2/10 complaining of dizziness/lightheadedness.  Found to have PVCs in bigeminy pattern.  He was given IV fluids in the ED, had improvement in symptoms.  EKG in the ED demonstrated probable conduction system delay at baseline with very wide QRS PVCs.  Prior to admission, patient had been taking 100 mg of  flecainide every a.m. and p.m., with additional 50 mg around noon.  Was also taking metoprolol 12.5 mg daily with an as needed dose for palpitations -Patient underwent echocardiogram that showed EF 50-55%, normal RV function, mild MR. -No atrial fibrillation or flutter noted on telemetry this admission -On 2/11, EP was consulted.  Patient told them that about 2 weeks ago, he had noted an increase in palpitations and started using his PRN mid-day dose of flecainide daily. -Per EP, suspect flecainide is failing and he will need a new AAD.  Most effective alternative would be amiodarone as mexiletine would not address A-fib and Tikosyn would not address his PVCs. -After discussion about changing medications versus discharging with a monitor and outpatient follow-up, patient felt most comfortable making no changes to his medications, wearing an outpatient monitor, and seeing EP team as an outpatient -With his conduction system disease, patient was advised to not take the additional midday 50 mg flecainide dose. -Continue flecainide 100 mg twice daily -Continue metoprolol tartrate 12.5 mg daily -Patient had a live ZIO monitor placed prior to discharge -CHA2DS2-VASc 1, not currently on oral anticoagulation -Patient has outpatient follow-up with Dr. Jimmey Ralph on 2/28   Did the patient have an acute coronary syndrome (MI, NSTEMI, STEMI, etc) this admission?:  No                               Did the patient have a percutaneous coronary intervention (stent / angioplasty)?:  No.    _____________  Discharge Vitals Blood pressure (!) 124/57, pulse (!) 50, temperature 97.9 F (36.6 C), temperature source Oral, resp. rate 18, SpO2 100%.  There were no vitals filed for this visit.  Labs & Radiologic Studies    CBC Recent Labs    12/06/23 2204 12/07/23 0357  WBC 5.1 6.7  HGB 13.7 14.0  HCT 40.8 41.9  MCV 94.9 95.7  PLT 126* 129*   Basic Metabolic Panel Recent Labs    16/10/96 1517 12/06/23 2204  12/07/23 0357  NA 140  --  141  K 4.2  --  3.7  CL 105  --  109  CO2 26  --  24  GLUCOSE 98  --  81  BUN 23  --  16  CREATININE 0.85 0.96 0.88  CALCIUM 9.5  --  9.0  MG 2.3  --   --    Liver Function Tests No results for input(s): "AST", "ALT", "ALKPHOS", "BILITOT", "PROT", "ALBUMIN" in the last 72 hours. No results for input(s): "LIPASE", "AMYLASE" in the last 72 hours. High Sensitivity Troponin:   No results for input(s): "TROPONINIHS" in the last 720 hours.  BNP Invalid input(s): "POCBNP" D-Dimer No results for input(s): "DDIMER" in the last 72 hours. Hemoglobin A1C No results for input(s): "HGBA1C" in the last 72 hours. Fasting Lipid Panel No results for input(s): "CHOL", "HDL", "LDLCALC", "TRIG", "CHOLHDL", "LDLDIRECT" in the last 72 hours. Thyroid Function Tests  No results for input(s): "TSH", "T4TOTAL", "T3FREE", "THYROIDAB" in the last 72 hours.  Invalid input(s): "FREET3" _____________  ECHOCARDIOGRAM COMPLETE Result Date: 12/07/2023    ECHOCARDIOGRAM REPORT   Patient Name:   Jacob Marsh Date of Exam: 12/07/2023 Medical Rec #:  [id number]       Height:       71.5 in Accession #:    [id number]      Weight:       154.0 lb Date of Birth:  [birth year]       BSA:          1.897 m Patient Age:    72 years        BP:           111/62 mmHg Patient Gender: M               HR:           63 bpm. Exam Location:  Inpatient Procedure: 2D Echo, Cardiac Doppler and Color Doppler Indications:    CHF-acute systolic  History:        Patient has prior history of Echocardiogram examinations, most                 recent 07/17/2013. Arrythmias:PVC and Atrial Fibrillation;                 Signs/Symptoms:Dizziness/Lightheadedness and Chest Pain.  Sonographer:    Vern Claude Referring Phys: [id number] Cyndi Bender  Sonographer Comments: Image acquisition challenging due to respiratory motion. IMPRESSIONS  1. Very difficult study with poor windows for TTE. LVEF appears low normal 50-55%.  2. Left ventricular  ejection fraction, by estimation, is 50 to 55%. The left ventricle has low normal function. The left ventricle demonstrates global hypokinesis. Left ventricular diastolic function could not be evaluated.  3. Right ventricular systolic function is normal. The right ventricular size is normal. Tricuspid regurgitation signal is inadequate for assessing PA pressure.  4. The mitral valve is grossly normal. Mild mitral valve regurgitation. No evidence of mitral stenosis.  5. The aortic valve is grossly normal. Aortic valve regurgitation is not visualized. No aortic stenosis is present.  6. The inferior vena cava is dilated in size with >50% respiratory variability, suggesting right atrial pressure of 8 mmHg. FINDINGS  Left Ventricle: Left ventricular ejection fraction, by estimation, is 50 to 55%. The left ventricle has low normal function. The left ventricle demonstrates global hypokinesis. The left ventricular internal cavity size was normal in size. There is no left ventricular hypertrophy. Left ventricular diastolic function could not be evaluated due to nondiagnostic images. Left ventricular diastolic function could not be evaluated. Right Ventricle: The right ventricular size is normal. No increase in right ventricular wall thickness. Right ventricular systolic function is normal. Tricuspid regurgitation signal is inadequate for assessing PA pressure. Left Atrium: Left atrial size was not well visualized. Right Atrium: Right atrial size was not well visualized. Prominent Eustachian valve. Pericardium: There is no evidence of pericardial effusion. Mitral Valve: The mitral valve is grossly normal. Mild mitral valve regurgitation. No evidence of mitral valve stenosis. MV peak gradient, 2.0 mmHg. The mean mitral valve gradient is 1.0 mmHg. Tricuspid Valve: The tricuspid valve is grossly normal. Tricuspid valve regurgitation is trivial. No evidence of tricuspid stenosis. Aortic Valve: The aortic valve is grossly normal.  Aortic valve regurgitation is not visualized. No aortic stenosis is present. Aortic valve mean gradient measures 4.0 mmHg. Aortic valve peak gradient measures 7.9 mmHg. Aortic valve area, by  VTI measures 1.55 cm. Pulmonic Valve: The pulmonic valve was grossly normal. Pulmonic valve regurgitation is not visualized. No evidence of pulmonic stenosis. Aorta: The aortic root and ascending aorta are structurally normal, with no evidence of dilitation. Venous: The inferior vena cava is dilated in size with greater than 50% respiratory variability, suggesting right atrial pressure of 8 mmHg. IAS/Shunts: The atrial septum is grossly normal.  LEFT VENTRICLE PLAX 2D LVIDd:         4.20 cm      Diastology LVIDs:         2.90 cm      LV e' medial:    9.25 cm/s LV PW:         0.80 cm      LV E/e' medial:  7.7 LV IVS:        0.80 cm      LV e' lateral:   9.36 cm/s LVOT diam:     1.90 cm      LV E/e' lateral: 7.6 LV SV:         41 LV SV Index:   22 LVOT Area:     2.84 cm  LV Volumes (MOD) LV vol d, MOD A2C: 135.0 ml LV vol d, MOD A4C: 130.0 ml LV vol s, MOD A2C: 37.9 ml LV vol s, MOD A4C: 43.5 ml LV SV MOD A2C:     97.1 ml LV SV MOD A4C:     130.0 ml LV SV MOD BP:      89.2 ml RIGHT VENTRICLE            IVC RV Basal diam:  4.90 cm    IVC diam: 2.10 cm RV Mid diam:    2.80 cm RV S prime:     8.49 cm/s TAPSE (M-mode): 3.9 cm LEFT ATRIUM             Index        RIGHT ATRIUM           Index LA diam:        2.70 cm 1.42 cm/m   RA Area:     15.50 cm LA Vol (A2C):   18.6 ml 9.81 ml/m   RA Volume:   39.70 ml  20.93 ml/m LA Vol (A4C):   19.4 ml 10.23 ml/m LA Biplane Vol: 20.7 ml 10.91 ml/m  AORTIC VALVE                    PULMONIC VALVE AV Area (Vmax):    1.58 cm     PV Vmax:       0.74 m/s AV Area (Vmean):   1.40 cm     PV Peak grad:  2.2 mmHg AV Area (VTI):     1.55 cm AV Vmax:           140.50 cm/s AV Vmean:          90.700 cm/s AV VTI:            0.264 m AV Peak Grad:      7.9 mmHg AV Mean Grad:      4.0 mmHg LVOT Vmax:          78.30 cm/s LVOT Vmean:        44.900 cm/s LVOT VTI:          0.144 m LVOT/AV VTI ratio: 0.55  AORTA Ao Root diam: 3.50 cm Ao Asc diam:  3.30 cm MITRAL VALVE MV Area (PHT): 2.06 cm  SHUNTS MV Area VTI:   1.57 cm    Systemic VTI:  0.14 m MV Peak grad:  2.0 mmHg    Systemic Diam: 1.90 cm MV Mean grad:  1.0 mmHg MV Vmax:       0.70 m/s MV Vmean:      57.2 cm/s MV Decel Time: 368 msec MV E velocity: 71.40 cm/s MV A velocity: 65.00 cm/s MV E/A ratio:  1.10 Lennie Odor MD Electronically signed by Lennie Odor MD Signature Date/Time: 12/07/2023/10:30:43 AM    Final    DG Chest Portable 1 View Result Date: 12/06/2023 CLINICAL DATA:  Palpitations and dizziness EXAM: PORTABLE CHEST 1 VIEW COMPARISON:  08/12/2012 FINDINGS: The heart size and mediastinal contours are within normal limits. Both lungs are clear. The visualized skeletal structures are unremarkable. IMPRESSION: No active disease. Electronically Signed   By: Paulina Fusi M.D.   On: 12/06/2023 18:35   Disposition   Pt is being discharged home today in good condition.  Follow-up Plans & Appointments     Follow-up Information     Nobie Putnam, MD Follow up.   Specialties: Cardiology, Radiology Why: 12/24/23 @ 8:30AM this is a partner of Dr. Ladona Ridgel that he wanted you to see to follow up with and discuss management strategies for your AFib, PVCs Contact information: [address] [place] [address] [phone]                Discharge Instructions     Call MD for:   Complete by: As directed    palpitations   Call MD for:  extreme fatigue   Complete by: As directed    Call MD for:  persistant dizziness or light-headedness   Complete by: As directed    Diet - low sodium heart healthy   Complete by: As directed    Increase activity slowly   Complete by: As directed         Discharge Medications   Allergies as of 12/07/2023   No Known Allergies      Medication List     TAKE these medications     acetaminophen 500 MG tablet Commonly known as: TYLENOL Take 1,000 mg by mouth every 6 (six) hours as needed for moderate pain or headache.   b complex vitamins capsule Take 1 capsule by mouth in the morning and at bedtime.   Charcoal Activated 280 MG Caps Take 2 capsules by mouth daily.   flecainide 100 MG tablet Commonly known as: TAMBOCOR Take 1 tablet (100 mg total) by mouth every 12 (twelve) hours. What changed:  how much to take how to take this when to take this additional instructions   MAGNESIUM MALATE PO Take 360 mg by mouth in the morning and at bedtime.   Melatonin 1 MG Caps Take 1.5 capsules by mouth at bedtime.   metoprolol tartrate 25 MG tablet Commonly known as: LOPRESSOR TAKE 1/2 TABLET BY MOUTH EVERY NIGHT AT BEDTIME. MAY TAKE AN ADDITIONAL 1/2 TABLET AS NEEDED FOR PALPITATION   multivitamin with minerals Tabs tablet Take 1 tablet by mouth daily.   OMEGA-3 FISH OIL PO Take 345 mg by mouth in the morning and at bedtime.   OVER THE COUNTER MEDICATION Take 1 tablet by mouth daily. NEUROMAG (Chelted Magnesium L-Thronate 50mg )   OVER THE COUNTER MEDICATION Take 2 capsules by mouth daily. LION'S MANE MUSHROOM (450 MG)   OVER THE COUNTER MEDICATION Take 1 capsule by mouth daily. BRAIN VITALE   SACCHAROMYCES BOULARDII  PO Take 420 mg by mouth in the morning and at bedtime.   Selenium 200 MCG Caps Take 1 capsule by mouth daily.   TURMERIC CURCUMIN PO Take 500 mg by mouth in the morning and at bedtime.   VITAMIN D-3 PO Take 200 mcg by mouth daily.   ZINC PICOLINATE PO Take 25 mg by mouth in the morning and at bedtime.           Outstanding Labs/Studies    Duration of Discharge Encounter: APP Time: 20 minutes   Signed, Jacob Albee, PA-C 12/07/2023, 1:30 PM     Patient Name: Jacob Marsh Date of Encounter: 12/07/2023 [facility] Cardiologist: Lewayne Bunting, MD    Interval Summary  .     Remains somewhat  lightheaded today, PVCs have slowed to occasional on telemetry and no longer in bigeminy.  He has received IV and oral hydration but has not had resolution of symptoms.  Blood pressure is low normal.   Vital Signs .           Vitals:    12/07/23 0630 12/07/23 0700 12/07/23 0730 12/07/23 0800  BP: (!) 101/55 104/71 104/65 111/62  Pulse: 61 (!) 52 (!) 44 (!) 53  Resp: 17 15 18 16   Temp:          TempSrc:          SpO2: 100% 100% 100% 99%      Intake/Output Summary (Last 24 hours) at 12/07/2023 0817 Last data filed at 12/07/2023 0814    Gross per 24 hour  Intake 1000 ml  Output 1250 ml  Net -250 ml        09/28/2023    8:31 AM 05/31/2023   10:24 AM 01/18/2023   10:50 AM  Last 3 Weights  Weight (lbs) 154 lb 152 lb 8 oz 152 lb  Weight (kg) 69.854 kg 69.174 kg 68.947 kg       Telemetry/ECG    Sinus rhythm PVCs- Personally Reviewed   Physical Exam .   GEN: No acute distress.   Neck: No JVD Cardiac: iRRR, no murmurs, rubs, or gallops.  Respiratory: Clear to auscultation bilaterally. GI: Soft, nontender, non-distended  MS: No edema   Assessment & Plan .     Principal Problem:   Dizziness Active Problems:   Thrombocytopenia (HCC)   Palpitations   PVCs (premature ventricular contractions)   - continued flecanide and metoprolol overnight. Did have some bradycardia overnight, nonworrisome.  ADDENDUM:  - Echo stable, but poor quality. EF preserved. - EP recommends flecanide 100 mg BID and metop 12.5 mg daily. No changes now, recommend monitor and and reassessment of AAD options, likely amiodarone or consider ablation.   Total time of encounter: 35 minutes total time of encounter, including 15 minutes spent in face-to-face patient care on the date of this encounter. This time includes coordination of care and counseling regarding above mentioned problem list. Remainder of non-face-to-face time involved reviewing chart documents/testing relevant to the patient encounter and  documentation in the medical record. I have independently reviewed documentation from referring provider.    For questions or updates, please contact [facility] Please consult [email or web address] for contact info under         Signed, Parke Poisson, MD

## 2023-12-07 NOTE — Progress Notes (Signed)
   Patient Name: Jacob Marsh Date of Encounter: 12/07/2023 [facility] Cardiologist: Lewayne Bunting, MD   Interval Summary  .    Remains somewhat lightheaded today, PVCs have slowed to occasional on telemetry and no longer in bigeminy.  He has received IV and oral hydration but has not had resolution of symptoms.  Blood pressure is low normal.  Vital Signs .    Vitals:   12/07/23 0630 12/07/23 0700 12/07/23 0730 12/07/23 0800  BP: (!) 101/55 104/71 104/65 111/62  Pulse: 61 (!) 52 (!) 44 (!) 53  Resp: 17 15 18 16   Temp:      TempSrc:      SpO2: 100% 100% 100% 99%    Intake/Output Summary (Last 24 hours) at 12/07/2023 0817 Last data filed at 12/07/2023 0814 Gross per 24 hour  Intake 1000 ml  Output 1250 ml  Net -250 ml      09/28/2023    8:31 AM 05/31/2023   10:24 AM 01/18/2023   10:50 AM  Last 3 Weights  Weight (lbs) 154 lb 152 lb 8 oz 152 lb  Weight (kg) 69.854 kg 69.174 kg 68.947 kg      Telemetry/ECG    Sinus rhythm PVCs- Personally Reviewed  Physical Exam .   GEN: No acute distress.   Neck: No JVD Cardiac: iRRR, no murmurs, rubs, or gallops.  Respiratory: Clear to auscultation bilaterally. GI: Soft, nontender, non-distended  MS: No edema  Assessment & Plan .     Principal Problem:   Dizziness Active Problems:   Thrombocytopenia (HCC)   Palpitations   PVCs (premature ventricular contractions)  - continued flecanide and metoprolol overnight. Did have some bradycardia overnight, nonworrisome.  - Echo today. - will d/w EP further to ensure no medication adjustment required.  Patient takes his flecainide at home as follows: 100 mg a.m., 50 mg midday, 100 mg p.m.  Metoprolol is 12.5 mg once daily with as needed dose for palpitations.   For questions or updates, please contact [facility] Please consult [email or web address] for contact info under        Signed, Parke Poisson, MD

## 2023-12-07 NOTE — ED Notes (Signed)
Pt is awake, eating breakfast. States that he is feeling much better this morning, palpitations are still present but causing far less discomfort than yesterday. Pt updated that echo will be part of plan of care today.

## 2023-12-07 NOTE — Progress Notes (Signed)
Zio monitor placed on patient.

## 2023-12-08 ENCOUNTER — Telehealth: Payer: Self-pay

## 2023-12-08 DIAGNOSIS — I480 Paroxysmal atrial fibrillation: Secondary | ICD-10-CM | POA: Diagnosis not present

## 2023-12-08 DIAGNOSIS — R42 Dizziness and giddiness: Secondary | ICD-10-CM | POA: Diagnosis not present

## 2023-12-08 NOTE — Transitions of Care (Post Inpatient/ED Visit) (Signed)
   12/08/2023  Name: Jacob Marsh MRN: [id number] DOB: 17-Aug-1951  Today's TOC FU Call Status: Today's TOC FU Call Status:: Unsuccessful Call (1st Attempt) Unsuccessful Call (1st Attempt) Date: 12/08/23  Attempted to reach the patient regarding the most recent Inpatient/ED visit.  Follow Up Plan: Additional outreach attempts will be made to reach the patient to complete the Transitions of Care (Post Inpatient/ED visit) call.   Signature Karena Addison, LPN [facility] Nurse Health Advisor Direct Dial [phone]

## 2023-12-09 ENCOUNTER — Encounter (HOSPITAL_COMMUNITY): Payer: Self-pay | Admitting: Emergency Medicine

## 2023-12-09 NOTE — Transitions of Care (Post Inpatient/ED Visit) (Signed)
   12/09/2023  Name: Jacob Marsh MRN: [id number] DOB: 1950/12/20  Today's TOC FU Call Status: Today's TOC FU Call Status:: Unsuccessful Call (2nd Attempt) Unsuccessful Call (1st Attempt) Date: 12/08/23 Unsuccessful Call (2nd Attempt) Date: 12/09/23  Attempted to reach the patient regarding the most recent Inpatient/ED visit.  Follow Up Plan: Additional outreach attempts will be made to reach the patient to complete the Transitions of Care (Post Inpatient/ED visit) call.   Signature Karena Addison, LPN [facility] Nurse Health Advisor Direct Dial [phone]

## 2023-12-13 NOTE — Transitions of Care (Post Inpatient/ED Visit) (Signed)
 12/13/2023  Name: Jacob Marsh MRN: [id number] DOB: April 11, 1951  Today's TOC FU Call Status: Today's TOC FU Call Status:: Successful TOC FU Call Completed Unsuccessful Call (1st Attempt) Date: 12/08/23 Unsuccessful Call (2nd Attempt) Date: 12/09/23 [facility] FU Call Complete Date: 12/13/23 Patient's Name and Date of Birth confirmed.  Transition Care Management Follow-up Telephone Call Date of Discharge: 12/07/23 Discharge Facility: [facility]) Type of Discharge: Inpatient Admission Primary Inpatient Discharge Diagnosis:: dizziness How have you been since you were released from the hospital?: Better Any questions or concerns?: No  Items Reviewed: Did you receive and understand the discharge instructions provided?: Yes Medications obtained,verified, and reconciled?: Yes (Medications Reviewed) Any new allergies since your discharge?: No Dietary orders reviewed?: Yes Do you have support at home?: No  Medications Reviewed Today: Medications Reviewed Today     Reviewed by Karena Addison, LPN (Licensed Practical Nurse) on 12/13/23 at 1525  Med List Status: <None>   Medication Order Taking? Sig Documenting Provider Last Dose Status Informant  acetaminophen (TYLENOL) 500 MG tablet [id number] No Take 1,000 mg by mouth every 6 (six) hours as needed for moderate pain or headache. [provider] Past Month Active Self  b complex vitamins capsule [id number] No Take 1 capsule by mouth in the morning and at bedtime. [provider] 12/06/2023 Active Self  Charcoal Activated 280 MG CAPS [id number] No Take 2 capsules by mouth daily. [provider] 12/06/2023 Active Self  Cholecalciferol (VITAMIN D-3 PO) [id number] No Take 200 mcg by mouth daily. [provider] 12/06/2023 Active Self  flecainide (TAMBOCOR) 100 MG tablet [id number]  Take 1 tablet (100 mg total) by mouth every 12 (twelve) hours. Merlene Laughter  Active   MAGNESIUM MALATE PO [id number] No  Take 360 mg by mouth in the morning and at bedtime. [provider] 12/06/2023 Active Self  Melatonin 1 MG CAPS [id number] No Take 1.5 capsules by mouth at bedtime. [provider] 12/05/2023 Active Self  metoprolol tartrate (LOPRESSOR) 25 MG tablet [id number] No TAKE 1/2 TABLET BY MOUTH EVERY NIGHT AT BEDTIME. MAY TAKE AN ADDITIONAL 1/2 TABLET AS NEEDED FOR PALPITATION Marinus Maw, MD 12/06/2023 Active Self, Pharmacy Records  Multiple Vitamin (MULTIVITAMIN WITH MINERALS) TABS [id number] No Take 1 tablet by mouth daily.  [provider] 12/06/2023 Active Self  Omega-3 Fatty Acids (OMEGA-3 FISH OIL PO) [id number] No Take 345 mg by mouth in the morning and at bedtime. [provider] 12/06/2023 Active Self  OVER THE COUNTER MEDICATION [id number] No Take 1 tablet by mouth daily. NEUROMAG (Chelted Magnesium L-Thronate 50mg ) [provider] 12/06/2023 Active Self  OVER THE COUNTER MEDICATION [id number] No Take 2 capsules by mouth daily. LION'S MANE MUSHROOM (450 MG) [provider] 12/06/2023 Active Self  OVER THE COUNTER MEDICATION [id number] No Take 1 capsule by mouth daily. BRAIN VITALE [provider] 12/06/2023 Active Self  SACCHAROMYCES BOULARDII PO [id number] No Take 420 mg by mouth in the morning and at bedtime. [provider] 12/06/2023 Active Self  Selenium 200 MCG CAPS [id number] No Take 1 capsule by mouth daily. [provider] 12/06/2023 Active Self  TURMERIC CURCUMIN PO [id number] No Take 500 mg by mouth in the morning and at bedtime. [provider] 12/06/2023 Active Self  ZINC PICOLINATE PO [id number] No Take 25 mg by mouth in the morning and at bedtime. [provider] 12/06/2023 Active Self            Home Care and Equipment/Supplies: Were Home Health Services  Ordered?: NA Any new equipment or medical supplies ordered?: NA  Functional Questionnaire: Do you need assistance with bathing/showering or  dressing?: No Do you need assistance with meal preparation?: No Do you need assistance with eating?: No Do you have difficulty maintaining continence: No Do you need assistance with getting out of bed/getting out of a chair/moving?: No  Follow up appointments reviewed: PCP Follow-up appointment confirmed?: Yes Date of PCP follow-up appointment?: 12/15/23 Follow-up Provider: [facility] Follow-up appointment confirmed?: Yes Date of Specialist follow-up appointment?: 12/28/23 Follow-Up Specialty Provider:: cardio Do you need transportation to your follow-up appointment?: No Do you understand care options if your condition(s) worsen?: Yes-patient verbalized understanding    SIGNATURE Karena Addison, LPN [facility] Nurse Health Advisor Direct Dial [phone]

## 2023-12-15 ENCOUNTER — Ambulatory Visit: Payer: Medicare Other | Admitting: Family Medicine

## 2023-12-18 LAB — GENECONNECT MOLECULAR SCREEN: Genetic Analysis Overall Interpretation: NEGATIVE

## 2023-12-21 ENCOUNTER — Ambulatory Visit (INDEPENDENT_AMBULATORY_CARE_PROVIDER_SITE_OTHER): Payer: Medicare Other | Admitting: Family Medicine

## 2023-12-21 ENCOUNTER — Encounter: Payer: Self-pay | Admitting: Family Medicine

## 2023-12-21 VITALS — BP 96/60 | HR 56 | Temp 97.6°F | Ht 71.5 in | Wt 157.0 lb

## 2023-12-21 DIAGNOSIS — G8929 Other chronic pain: Secondary | ICD-10-CM | POA: Diagnosis not present

## 2023-12-21 DIAGNOSIS — R42 Dizziness and giddiness: Secondary | ICD-10-CM

## 2023-12-21 DIAGNOSIS — R5382 Chronic fatigue, unspecified: Secondary | ICD-10-CM

## 2023-12-21 DIAGNOSIS — I4891 Unspecified atrial fibrillation: Secondary | ICD-10-CM | POA: Diagnosis not present

## 2023-12-21 DIAGNOSIS — R5383 Other fatigue: Secondary | ICD-10-CM | POA: Insufficient documentation

## 2023-12-21 DIAGNOSIS — D696 Thrombocytopenia, unspecified: Secondary | ICD-10-CM | POA: Diagnosis not present

## 2023-12-21 DIAGNOSIS — M545 Low back pain, unspecified: Secondary | ICD-10-CM

## 2023-12-21 DIAGNOSIS — I493 Ventricular premature depolarization: Secondary | ICD-10-CM

## 2023-12-21 LAB — CBC WITH DIFFERENTIAL/PLATELET
Basophils Absolute: 0 10*3/uL (ref 0.0–0.1)
Basophils Relative: 0.4 % (ref 0.0–3.0)
Eosinophils Absolute: 0.1 10*3/uL (ref 0.0–0.7)
Eosinophils Relative: 1 % (ref 0.0–5.0)
HCT: 43.8 % (ref 39.0–52.0)
Hemoglobin: 14.6 g/dL (ref 13.0–17.0)
Lymphocytes Relative: 19.6 % (ref 12.0–46.0)
Lymphs Abs: 1.4 10*3/uL (ref 0.7–4.0)
MCHC: 33.4 g/dL (ref 30.0–36.0)
MCV: 97.2 fl (ref 78.0–100.0)
Monocytes Absolute: 0.6 10*3/uL (ref 0.1–1.0)
Monocytes Relative: 9 % (ref 3.0–12.0)
Neutro Abs: 5.1 10*3/uL (ref 1.4–7.7)
Neutrophils Relative %: 70 % (ref 43.0–77.0)
Platelets: 150 10*3/uL (ref 150.0–400.0)
RBC: 4.51 Mil/uL (ref 4.22–5.81)
RDW: 13.7 % (ref 11.5–15.5)
WBC: 7.2 10*3/uL (ref 4.0–10.5)

## 2023-12-21 LAB — TSH: TSH: 4.19 u[IU]/mL (ref 0.35–5.50)

## 2023-12-21 NOTE — Assessment & Plan Note (Signed)
 Pt mentions this is ongoing  Likely muscular  No doubt deg changes in spine but nothing to suggest fracture   Reassuring exam Ref made for PT Core strength will become more important with time as well

## 2023-12-21 NOTE — Patient Instructions (Addendum)
 Try to keep fluids up even when busy    I put the referral in for PT for your back  Please let us know if you don't hear in 1-2 weeks   Follow up with cardiology   Lab today for thyroid and cbc /platelets

## 2023-12-21 NOTE — Assessment & Plan Note (Signed)
 With zio monitor now Some palpitations For cardiology follow up Friday  Continues flecainide

## 2023-12-21 NOTE — Progress Notes (Signed)
 Subjective:    Patient ID: Jacob Marsh, male    DOB: 10-25-1951, 73 y.o.   MRN: [id number]  HPI  Wt Readings from Last 3 Encounters:  12/21/23 157 lb (71.2 kg)  09/28/23 154 lb (69.9 kg)  05/31/23 152 lb 8 oz (69.2 kg)   21.59 kg/m  Vitals:   12/21/23 1045  BP: 96/60  Pulse: (!) 56  Temp: 97.6 F (36.4 C)  SpO2: 99%    Pt presents for follow up of ER hosp for palpitations on 12/06/23  Also back problems   Found to be in bigeminy in ER  Given fluids  Noted some dizziness   Noted his wife did pass away since last visit    EKG EKG Interpretation Date/Time:                  Monday December 06 2023 15:07:09 EST Ventricular Rate:         60 PR Interval:                 216 QRS Duration:             128 QT Interval:                 498 QTC Calculation:498 R Axis:                         -51   Text Interpretation:Sinus rhythm with 1st degree A-V block with frequent Premature ventricular complexes Right bundle branch block Left anterior fascicular block Bifascicular block Abnormal ECG When compared with ECG of 28-Sep-2023 08:29, PREVIOUS ECG IS PRESENT   BNP 295 Plt 126  No bruising or bleeding   Still walking dog with exercise  Feels palpitations more when exerting         Admission on 12/06/2023, Discharged on 12/07/2023  Component Date Value Ref Range Status   Sodium 12/06/2023 140  135 - 145 mmol/L Final   Potassium 12/06/2023 4.2  3.5 - 5.1 mmol/L Final   Chloride 12/06/2023 105  98 - 111 mmol/L Final   CO2 12/06/2023 26  22 - 32 mmol/L Final   Glucose, Bld 12/06/2023 98  70 - 99 mg/dL Final   Glucose reference range applies only to samples taken after fasting for at least 8 hours.   BUN 12/06/2023 23  8 - 23 mg/dL Final   Creatinine, Ser 12/06/2023 0.85  0.61 - 1.24 mg/dL Final   Calcium 41/32/4401 9.5  8.9 - 10.3 mg/dL Final   GFR, Estimated 12/06/2023 >60  >60 mL/min Final   Comment: (NOTE) Calculated using the CKD-EPI Creatinine Equation  (2021)    Anion gap 12/06/2023 9  5 - 15 Final   Performed at [facility], [address] [place], [place] [address]   WBC 12/06/2023 7.1  4.0 - 10.5 K/uL Final   RBC 12/06/2023 4.68  4.22 - 5.81 MIL/uL Final   Hemoglobin 12/06/2023 15.1  13.0 - 17.0 g/dL Final   HCT 36/64/4034 45.2  39.0 - 52.0 % Final   MCV 12/06/2023 96.6  80.0 - 100.0 fL Final   MCH 12/06/2023 32.3  26.0 - 34.0 pg Final   MCHC 12/06/2023 33.4  30.0 - 36.0 g/dL Final   RDW 74/25/9563 13.3  11.5 - 15.5 % Final   Platelets 12/06/2023 154  150 - 400 K/uL Final   nRBC 12/06/2023 0.0  0.0 - 0.2 % Final   Performed at [facility], [address], [place], [place] [address]   B Natriuretic Peptide 12/06/2023 295.1 (H)  0.0 - 100.0 pg/mL Final   Performed at [facility], [address], [place], [place] [address]   Magnesium 12/06/2023 2.3  1.7 - 2.4 mg/dL Final   Performed at [facility], [address] [place], [place] [address]   WBC 12/06/2023 5.1  4.0 - 10.5 K/uL Final   RBC 12/06/2023 4.30  4.22 - 5.81 MIL/uL Final   Hemoglobin 12/06/2023 13.7  13.0 - 17.0 g/dL Final   HCT 91/47/8295 40.8  39.0 - 52.0 % Final   MCV 12/06/2023 94.9  80.0 - 100.0 fL Final   MCH 12/06/2023 31.9  26.0 - 34.0 pg Final   MCHC 12/06/2023 33.6  30.0 - 36.0 g/dL Final   RDW 62/13/0865 13.3  11.5 - 15.5 % Final   Platelets 12/06/2023 126 (L)  150 - 400 K/uL Final   REPEATED TO VERIFY   nRBC 12/06/2023 0.0  0.0 - 0.2 % Final   Performed at [facility], [address] [place], [place] [address]   Creatinine, Ser 12/06/2023 0.96  0.61 - 1.24 mg/dL Final   GFR, Estimated 12/06/2023 >60  >60 mL/min Final   Comment: (NOTE) Calculated using the CKD-EPI Creatinine Equation (2021) Performed at [facility], [address] [place], [place] [address]    Sodium 12/07/2023 141  135 - 145 mmol/L Final   Potassium 12/07/2023 3.7  3.5 - 5.1 mmol/L Final   Chloride 12/07/2023 109  98 - 111 mmol/L Final    CO2 12/07/2023 24  22 - 32 mmol/L Final   Glucose, Bld 12/07/2023 81  70 - 99 mg/dL Final   Glucose reference range applies only to samples taken after fasting for at least 8 hours.   BUN 12/07/2023 16  8 - 23 mg/dL Final   Creatinine, Ser 12/07/2023 0.88  0.61 - 1.24 mg/dL Final   Calcium 84/13/2440 9.0  8.9 - 10.3 mg/dL Final   GFR, Estimated 12/07/2023 >60  >60 mL/min Final   Comment: (NOTE) Calculated using the CKD-EPI Creatinine Equation (2021)    Anion gap 12/07/2023 8  5 - 15 Final   Performed at [facility], [address], [place], [place] [address]   WBC 12/07/2023 6.7  4.0 - 10.5 K/uL Final   RBC 12/07/2023 4.38  4.22 - 5.81 MIL/uL Final   Hemoglobin 12/07/2023 14.0  13.0 - 17.0 g/dL Final   HCT 53/66/4403 41.9  39.0 - 52.0 % Final   MCV 12/07/2023 95.7  80.0 - 100.0 fL Final   MCH 12/07/2023 32.0  26.0 - 34.0 pg Final   MCHC 12/07/2023 33.4  30.0 - 36.0 g/dL Final   RDW 47/42/5956 13.2  11.5 - 15.5 % Final   Platelets 12/07/2023 129 (L)  150 - 400 K/uL Final   REPEATED TO VERIFY   nRBC 12/07/2023 0.0  0.0 - 0.2 % Final   Performed at [facility], [address] [place], [place] [address]   BP 12/07/2023 111/62  mmHg Final   Single Plane A2C EF 12/07/2023 71.9  % Final   Single Plane A4C EF 12/07/2023 66.5  % Final   Calc EF 12/07/2023 66.7  % Final   S' Lateral 12/07/2023 2.90  cm Final   AR max vel 12/07/2023 1.58  cm2 Final   AV Area VTI 12/07/2023 1.55  cm2 Final  AV Mean grad 12/07/2023 4.0  mmHg Final   AV Peak grad 12/07/2023 7.9  mmHg Final   Ao pk vel 12/07/2023 1.41  m/s Final   Area-P 1/2 12/07/2023 2.06  cm2 Final   AV Area mean vel 12/07/2023 1.40  cm2 Final   MV VTI 12/07/2023 1.57  cm2 Final   Est EF 12/07/2023 50 - 55%   Final     Echocardiogram done  Echocardiogram 12/07/23  1. Very difficult study with poor windows for TTE. LVEF appears low  normal 50-55%.   2. Left ventricular ejection fraction, by estimation, is 50 to 55%. The   left ventricle has low normal function. The left ventricle demonstrates  global hypokinesis. Left ventricular diastolic function could not be  evaluated.   3. Right ventricular systolic function is normal. The right ventricular  size is normal. Tricuspid regurgitation signal is inadequate for assessing  PA pressure.   4. The mitral valve is grossly normal. Mild mitral valve regurgitation.  No evidence of mitral stenosis.   5. The aortic valve is grossly normal. Aortic valve regurgitation is not  visualized. No aortic stenosis is present.   6. The inferior vena cava is dilated in size with >50% respiratory  variability, suggesting right atrial pressure of 8 mmHg.  ______  Normal cxr  Cardiology/EP consulted Discussed option of amiodarone vs monitor and continue flecainide  and metoprolol  Per note: -Continue flecainide 100 mg twice daily -Continue metoprolol tartrate 12.5 mg daily -Patient had a live ZIO monitor placed prior to discharge -CHA2DS2-VASc 1, not currently on oral anticoagulation -Patient has outpatient follow-up with Dr. Jimmey Ralph on 2/28  Just getting instructions for the zio  Still feeling the palpitations  No longer feels dizzy   Definitely lower energy recently    Takes cog tests for his alz program/study  Got harder   Cardiology visit Friday    Lab Results  Component Value Date   TSH 5.42 05/24/2023   Caffeine - drinks half caf so equiv of 1 cup in am and 1/2 cup in afternoon   Staying busy  Supposed to be retired / still doing quite a bit  (6 hours a bit ) - trying to set limits   Has to be better about fluid intake    Did gene connect program through [facility]  Had neg testing for gene marker for colon cancer and heart dz Reassuring   On /off has some back issues  Low back  Just above belt line  Either side of spine  Occational a sharp pain grabs him  Occational pain midline on bones     Is careful about lifting   He exercises  Nothing  for core strength        Patient Active Problem List   Diagnosis Date Noted   Low back pain 12/21/2023   Fatigue 12/21/2023   Dizziness 12/06/2023   Family history of colon cancer 06/19/2022   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   Vitamin D deficiency 02/18/2021   Elevated TSH 02/18/2021   A-fib (HCC) 10/17/2019   Dupuytren contracture 12/29/2017   Trigger finger of both hands    Hx of echocardiogram    Diverticulosis    BPH (benign prostatic hyperplasia) 09/15/2016   Need for hepatitis C screening test 09/07/2016   Encounter for Medicare annual wellness exam 12/11/2014   Screening for lipoid disorders 12/03/2014   PVCs (premature ventricular contractions) 03/20/2014   Postural dizziness 03/20/2014   Palpitations 08/10/2013   History of chest  pain 08/17/2012   Routine general medical examination at a health care facility 08/09/2012   Prostate cancer screening 08/09/2012   Thrombocytopenia (HCC) 07/18/2009   Past Medical History:  Diagnosis Date   A-fib (HCC) 10/17/2019   BPH (benign prostatic hyperplasia) 09/15/2016   Mild symptoms     Depression    Diverticulosis    History of diverticulosis// flex sig 2006//colonoscopy 01/21/2000 diverticulosis   Dupuytren contracture 12/29/2017   GERD (gastroesophageal reflux disease)    Globus sensation 07/17/2014   Hx of echocardiogram    Echo (9/14): EF 55%, grade 1 diastolic dysfunction, trivial AI, trivial MR, MAC, PASP 29   Palpitations 08/10/2013   Postural dizziness 03/20/2014   PVC (premature ventricular contraction)    PVCs (premature ventricular contractions) 03/20/2014   Trigger finger of both hands    Past Surgical History:  Procedure Laterality Date   A-FLUTTER ABLATION N/A 11/13/2019   Procedure: A-FLUTTER ABLATION;  Surgeon: Marinus Maw, MD;  Location: [facility] INVASIVE CV LAB;  Service: Cardiovascular;  Laterality: N/A;   DUPUYTREN CONTRACTURE RELEASE     HAND SURGERY Right 08/2020   TONSILLECTOMY     WISDOM TOOTH  EXTRACTION     Social History   Tobacco Use   Smoking status: Never   Smokeless tobacco: Never  Vaping Use   Vaping status: Never Used  Substance Use Topics   Alcohol use: Yes    Alcohol/week: 1.0 standard drink of alcohol    Types: 1 Glasses of wine per week    Comment: occ   Drug use: No   Family History  Problem Relation Age of Onset   Arthritis Mother    Heart disease Mother        ? atrial fib   Colon polyps Father    Depression Sister    Heart disease Maternal Grandmother        CAD   Cancer Maternal Grandmother        liver cancer   Heart disease Maternal Grandfather        CAD   Colon cancer Paternal Grandmother    Cancer - Colon Paternal Grandmother    Cancer Maternal Aunt        ?colon cancer   Colon cancer Paternal Uncle    Sudden death Neg Hx    No Known Allergies Current Outpatient Medications on File Prior to Visit  Medication Sig Dispense Refill   acetaminophen (TYLENOL) 500 MG tablet Take 1,000 mg by mouth every 6 (six) hours as needed for moderate pain or headache.     b complex vitamins capsule Take 1 capsule by mouth in the morning and at bedtime.     Charcoal Activated 280 MG CAPS Take 2 capsules by mouth daily.     Cholecalciferol (VITAMIN D-3 PO) Take 200 mcg by mouth daily.     flecainide (TAMBOCOR) 100 MG tablet Take 1 tablet (100 mg total) by mouth every 12 (twelve) hours.     MAGNESIUM MALATE PO Take 360 mg by mouth in the morning and at bedtime.     Melatonin 1 MG CAPS Take 1.5 capsules by mouth at bedtime.     metoprolol tartrate (LOPRESSOR) 25 MG tablet TAKE 1/2 TABLET BY MOUTH EVERY NIGHT AT BEDTIME. MAY TAKE AN ADDITIONAL 1/2 TABLET AS NEEDED FOR PALPITATION 90 tablet 3   Multiple Vitamin (MULTIVITAMIN WITH MINERALS) TABS Take 1 tablet by mouth daily.      Omega-3 Fatty Acids (OMEGA-3 FISH OIL PO) Take 345 mg by  mouth in the morning and at bedtime.     OVER THE COUNTER MEDICATION Take 1 tablet by mouth daily. NEUROMAG (Chelted Magnesium  L-Thronate 50mg )     OVER THE COUNTER MEDICATION Take 2 capsules by mouth daily. LION'S MANE MUSHROOM (450 MG)     OVER THE COUNTER MEDICATION Take 1 capsule by mouth daily. BRAIN VITALE     SACCHAROMYCES BOULARDII PO Take 420 mg by mouth in the morning and at bedtime.     Selenium 200 MCG CAPS Take 1 capsule by mouth daily.     TURMERIC CURCUMIN PO Take 500 mg by mouth in the morning and at bedtime.     ZINC PICOLINATE PO Take 25 mg by mouth in the morning and at bedtime.     No current facility-administered medications on file prior to visit.    Review of Systems     Objective:   Physical Exam Constitutional:      General: He is not in acute distress.    Appearance: Normal appearance. He is well-developed and normal weight. He is not ill-appearing or diaphoretic.  HENT:     Head: Normocephalic and atraumatic.  Eyes:     Conjunctiva/sclera: Conjunctivae normal.     Pupils: Pupils are equal, round, and reactive to light.  Neck:     Thyroid: No thyromegaly.     Vascular: No carotid bruit or JVD.  Cardiovascular:     Rate and Rhythm: Regular rhythm. Bradycardia present.     Heart sounds: Normal heart sounds.     No gallop.  Pulmonary:     Effort: Pulmonary effort is normal. No respiratory distress.     Breath sounds: Normal breath sounds. No stridor. No wheezing, rhonchi or rales.  Abdominal:     General: There is no distension or abdominal bruit.     Palpations: Abdomen is soft.  Musculoskeletal:     Cervical back: Normal range of motion and neck supple.     Lumbar back: No swelling or bony tenderness. Normal range of motion. Negative right straight leg raise test and negative left straight leg raise test. No scoliosis.     Right lower leg: No edema.     Left lower leg: No edema.     Comments: Some loss of lordosis  Some spasm  More pain with ext than flex  More pain with right lat bend than left  No neuro changes   Lymphadenopathy:     Cervical: No cervical adenopathy.   Skin:    General: Skin is warm and dry.     Coloration: Skin is not pale.     Findings: No rash.  Neurological:     Mental Status: He is alert.     Coordination: Coordination normal.     Deep Tendon Reflexes: Reflexes are normal and symmetric. Reflexes normal.  Psychiatric:        Mood and Affect: Mood normal.           Assessment & Plan:   Problem List Items Addressed This Visit       Cardiovascular and Mediastinum   PVCs (premature ventricular contractions) - Primary   Causing palpitations Seen in hospital on 2/10  Reviewed hospital records, lab results and studies in detail   Overall reassuring  Opted to continue flecainide and do zio monitor  Follow up planned with cardiology on Friday to make a plan based on rhythm  Reassuring exam today  Encouraged avoidance of caffine  Cbc and tsh today  A-fib (HCC)   With zio monitor now Some palpitations For cardiology follow up Friday  Continues flecainide         Hematopoietic and Hemostatic   Thrombocytopenia (HCC)   No bleeding or bruising  Plt 126 in Er during period of palpitations  Re check today      Relevant Orders   CBC with Differential/Platelet     Other   Low back pain   Pt mentions this is ongoing  Likely muscular  No doubt deg changes in spine but nothing to suggest fracture   Reassuring exam Ref made for PT Core strength will become more important with time as well      Relevant Orders   Ambulatory referral to Physical Therapy   Fatigue   Tsh and cbc today  Worse lately in setting of palpitations   Did loose wife over a year ago      Relevant Orders   CBC with Differential/Platelet   TSH   Dizziness   Overall improved Baseline low blood pressure  Encouraged to stand slowly and pause before walking Call back and Er precautions noted in detail today

## 2023-12-21 NOTE — Assessment & Plan Note (Signed)
 Causing palpitations Seen in hospital on 2/10  Reviewed hospital records, lab results and studies in detail   Overall reassuring  Opted to continue flecainide and do zio monitor  Follow up planned with cardiology on Friday to make a plan based on rhythm  Reassuring exam today  Encouraged avoidance of caffine  Cbc and tsh today

## 2023-12-21 NOTE — Assessment & Plan Note (Signed)
 No bleeding or bruising  Plt 126 in Er during period of palpitations  Re check today

## 2023-12-21 NOTE — Assessment & Plan Note (Signed)
 Overall improved Baseline low blood pressure  Encouraged to stand slowly and pause before walking Call back and Er precautions noted in detail today

## 2023-12-21 NOTE — Assessment & Plan Note (Signed)
 Tsh and cbc today  Worse lately in setting of palpitations   Did loose wife over a year ago

## 2023-12-23 NOTE — Progress Notes (Unsigned)
 Electrophysiology Office Note:   Date:  12/23/2023  ID:  Dav[place], [place] November 10, 1950, MRN [id number]  Primary Cardiologist: Lewayne Bunting, MD Electrophysiologist: Lewayne Bunting, MD  {Click to update primary MD,subspecialty MD or APP then REFRESH:1}    History of Present Illness:   ADRION MENZ is a 73 y.o. male with h/o atrial flutter s/p ablation 2021, persistent atrial fibrillation, PVCs who is being seen today for EP follow up.  Patient presented to ED on 2/10 with a chief complaint of dizziness. He was found to be in ventricular bigeminy at the time. He also felt like he may have been having increased burden of AF leading up to hospital visit. Patient did not want to make any changes to regimen at that time. He was given a cardiac monitor to assess burden of AF and PVCs with plans to follow up in clinic.  Discussed the use of AI scribe software for clinical note transcription with the patient, who gave verbal consent to proceed.  History of Present Illness            Review of systems complete and found to be negative unless listed in HPI.   EP Information / Studies Reviewed:    {EKGtoday:28818}      Echo 12/07/23:   1. Very difficult study with poor windows for TTE. LVEF appears low  normal 50-55%.   2. Left ventricular ejection fraction, by estimation, is 50 to 55%. The  left ventricle has low normal function. The left ventricle demonstrates  global hypokinesis. Left ventricular diastolic function could not be  evaluated.   3. Right ventricular systolic function is normal. The right ventricular  size is normal. Tricuspid regurgitation signal is inadequate for assessing  PA pressure.   4. The mitral valve is grossly normal. Mild mitral valve regurgitation.  No evidence of mitral stenosis.   5. The aortic valve is grossly normal. Aortic valve regurgitation is not  visualized. No aortic stenosis is present.   6. The inferior vena cava is dilated in size with >50%  respiratory  variability, suggesting right atrial pressure of 8 mmHg.   Risk Assessment/Calculations:    CHA2DS2-VASc Score = 1  {Click here to calculate score.  REFRESH note before signing. :1} This indicates a 0.6% annual risk of stroke. [name] score is based upon: CHF History: 0 HTN History: 0 Diabetes History: 0 Stroke History: 0 Vascular Disease History: 0 Age Score: 1 Gender Score: 0     No BP recorded.  {Refresh Note OR Click here to enter BP  :1}***        Physical Exam:   VS:  There were no vitals taken for this visit.   Wt Readings from Last 3 Encounters:  12/21/23 157 lb (71.2 kg)  09/28/23 154 lb (69.9 kg)  05/31/23 152 lb 8 oz (69.2 kg)     GEN: Well nourished, well developed in no acute distress NECK: No JVD CARDIAC: {EPRHYTHM:28826}, no murmurs, rubs, gallops RESPIRATORY:  Clear to auscultation without rales, wheezing or rhonchi  ABDOMEN: Soft, non-distended EXTREMITIES:  No edema; No deformity   ASSESSMENT AND PLAN:   ADHRIT KRENZ is a 73 y.o. male with h/o atrial flutter s/p CTI ablation 2021, persistent atrial fibrillation, PVCs who is being seen today for EP follow up.  #. Persistent atrial fibrillation, symptomatic:  #. Atrial flutter s/p CTI ablation in 2021: #. 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 Off anti-coagulation.   #. 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 - Last EF 50-55%.               Follow up with {XBJYN:82956} {EPFOLLOW OZ:30865}  Signed, Nobie Putnam, MD

## 2023-12-24 ENCOUNTER — Ambulatory Visit: Payer: PRIVATE HEALTH INSURANCE | Admitting: Cardiology

## 2024-01-03 NOTE — Progress Notes (Unsigned)
 Electrophysiology Office Note:   Date:  01/04/2024  ID:  [name], DOB 02-21-1951, MRN [id number]  Primary Cardiologist: Lewayne Bunting, MD Electrophysiologist: Lewayne Bunting, MD      History of Present Illness:    CC: Jacob Marsh is a 73 y.o. male with h/o atrial flutter s/p ablation 2021, persistent atrial fibrillation, PVCs who is being seen today for EP follow up.  Patient presented to ED on 2/10 with a chief complaint of dizziness. He was found to be in ventricular bigeminy at the time. He also felt like he may have been having increased burden of AF leading up to hospital visit. Patient did not want to make any changes to regimen at that time. He was given a cardiac monitor to assess burden of AF and PVCs with plans to follow up in clinic.  Discussed the use of AI scribe software for clinical note transcription with the patient, who gave verbal consent to proceed.  History of Present Illness   The patient, with a known history of atrial fibrillation and premature ventricular contractions (PVCs), presents with progressively worsening symptoms over the past few months, which corresponds with an increase in PVC burden. The patient describes feeling lightheaded, experiencing palpitations, and having chest pressure. These symptoms have been severe enough to cause concern and affect his daily activities, including his ability to walk his dog for three miles daily. The patient reports that the symptoms come and go. However, recently, the symptoms have been more noticeable and bothersome. The patient has been wearing a heart monitor for about three weeks to help determine the cause of his symptoms. The patient is currently on flecainide for his heart conditions, but the medication does not seem to be significantly improving his symptoms. No acute complaints today.     Review of systems complete and found to be negative unless listed in HPI.   EP Information / Studies Personally Reviewed:     EKG is ordered today. Personal review as below.  EKG Interpretation Date/Time:  Tuesday January 04 2024 10:15:51 EDT Ventricular Rate:  66 PR Interval:  220 QRS Duration:  114 QT Interval:  460 QTC Calculation: 482 R Axis:   -68  Text Interpretation: Sinus bradycardia with 1st degree A-V block with frequent Premature ventricular complexes Incomplete right bundle branch block Left anterior fascicular block When compared with ECG of 06-Dec-2023 15:07, No significant change was found Confirmed by Nobie Putnam [phone]) on 01/04/2024 10:23:42 AM    EKG 12/06/23: Sinus with PVCs.    Echo 12/07/23:   1. Very difficult study with poor windows for TTE. LVEF appears low  normal 50-55%.   2. Left ventricular ejection fraction, by estimation, is 50 to 55%. The  left ventricle has low normal function. The left ventricle demonstrates  global hypokinesis. Left ventricular diastolic function could not be  evaluated.   3. Right ventricular systolic function is normal. The right ventricular  size is normal. Tricuspid regurgitation signal is inadequate for assessing  PA pressure.   4. The mitral valve is grossly normal. Mild mitral valve regurgitation.  No evidence of mitral stenosis.   5. The aortic valve is grossly normal. Aortic valve regurgitation is not  visualized. No aortic stenosis is present.   6. The inferior vena cava is dilated in size with >50% respiratory  variability, suggesting right atrial pressure of 8 mmHg.   Risk Assessment/Calculations:    CHA2DS2-VASc Score = 1   This indicates a 0.6% annual risk of stroke. The  patient's score is based upon: CHF History: 0 HTN History: 0 Diabetes History: 0 Stroke History: 0 Vascular Disease History: 0 Age Score: 1 Gender Score: 0             Physical Exam:   VS:  BP 114/64   Pulse 66   Ht 5' 11.5" (1.816 m)   Wt 158 lb 3.2 oz (71.8 kg)   SpO2 98%   BMI 21.76 kg/m    Wt Readings from Last 3 Encounters:  01/04/24 158 lb  3.2 oz (71.8 kg)  12/21/23 157 lb (71.2 kg)  09/28/23 154 lb (69.9 kg)     GEN: Well nourished, well developed in no acute distress NECK: No JVD CARDIAC: Normal rate, irregular rhythm.  RESPIRATORY:  Clear to auscultation without rales, wheezing or rhonchi  ABDOMEN: Soft, non-distended EXTREMITIES:  No edema; No deformity   ASSESSMENT AND PLAN:   KVION SHAPLEY is a 73 y.o. male with h/o atrial flutter s/p CTI ablation 2021, persistent atrial fibrillation, PVCs who is being seen today for EP follow up.  #. Frequent PVCs, symptomatic: RBBB, left superior axis. Possibly posteromedial papillary muscle. This is likely the primary driver of his symptoms at this point.  - Last EF 50-55%. - Discussed treatment options today for PVCs including changing antiarrhythmic drug therapy to sotalol +/- mexiletine and ablation. Discussed risks, recovery and likelihood of success with each treatment strategy. Risk, benefits, and alternatives to EP study and ablation for PVCs were discussed. These risks include but are not limited to stroke, bleeding, vascular damage, tamponade, perforation, damage to the esophagus, lungs, phrenic nerve and other structures, worsening renal function, coronary vasospasm and death.  Discussed potential need for repeat ablation procedures and antiarrhythmic drugs after an initial ablation. The patient understands these risk and wishes to proceed.  We will therefore proceed with catheter ablation at the next available time.  Carto, ICE, anesthesia are requested for the procedure.   - Continue metoprolol 12.5mg  in the evening. Additional once daily as needed.  - Flecainide does not appear to be suppressing his PVCs. He has baseline conduction disease. Decrease flecainide to 50mg  twice daily. - Follow up result of Zio monitor for overall burden.     #. Persistent atrial fibrillation, symptomatic: Unclear what his burden is. His symptoms at present appear to be more from his PVCs.  #.  Atrial flutter s/p CTI ablation in 2021: #. 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 Off anti-coagulation.  -Continue metoprolol 12.5mg  in the evening. Additional once daily as needed.  -Decrease flecainide to 50mg  twice daily. -Assess burden on Zio.   #. High risk medication use: Anti-arrhythmic drug therapy. On flecainide. #. Incomplete RBBB.  #. LAFB.  -EKG today with sinus rhythm with PVCs, RBBB (QRS ), LAFB and PR . These findings are relatively stable but will decrease flecainide to 50mg  twice daily as higher doses does not appear to be suppressing PVCs.     Signed, Nobie Putnam, MD

## 2024-01-04 ENCOUNTER — Ambulatory Visit: Payer: No Typology Code available for payment source | Attending: Cardiology | Admitting: Cardiology

## 2024-01-04 ENCOUNTER — Encounter: Payer: Self-pay | Admitting: Cardiology

## 2024-01-04 VITALS — BP 114/64 | HR 66 | Ht 71.5 in | Wt 158.2 lb

## 2024-01-04 DIAGNOSIS — I4819 Other persistent atrial fibrillation: Secondary | ICD-10-CM | POA: Insufficient documentation

## 2024-01-04 DIAGNOSIS — I4510 Unspecified right bundle-branch block: Secondary | ICD-10-CM | POA: Diagnosis present

## 2024-01-04 DIAGNOSIS — I444 Left anterior fascicular block: Secondary | ICD-10-CM | POA: Insufficient documentation

## 2024-01-04 DIAGNOSIS — Z79899 Other long term (current) drug therapy: Secondary | ICD-10-CM | POA: Insufficient documentation

## 2024-01-04 DIAGNOSIS — I483 Typical atrial flutter: Secondary | ICD-10-CM | POA: Diagnosis not present

## 2024-01-04 DIAGNOSIS — I493 Ventricular premature depolarization: Secondary | ICD-10-CM | POA: Diagnosis not present

## 2024-01-04 DIAGNOSIS — D6869 Other thrombophilia: Secondary | ICD-10-CM | POA: Insufficient documentation

## 2024-01-04 DIAGNOSIS — I4891 Unspecified atrial fibrillation: Secondary | ICD-10-CM

## 2024-01-04 MED ORDER — FLECAINIDE ACETATE 50 MG PO TABS: 50.0000 mg | ORAL_TABLET | Freq: Two times a day (BID) | ORAL | 3 refills | Status: DC

## 2024-01-04 NOTE — Addendum Note (Signed)
 Encounter addended by: Andee Lineman A on: 01/04/2024 5:40 PM  Actions taken: Imaging Exam ended

## 2024-01-04 NOTE — Patient Instructions (Addendum)
 Medication Instructions:  Your physician has recommended you make the following change in your medication:  1) DECREASE flecainide to 50 mg twice daily *If you need a refill on your cardiac medications before your next appointment, please call your pharmacy*  Lab Work: BMET and CBC  Testing/Procedures: Ablation Your physician has recommended that you have an ablation. Catheter ablation is a medical procedure used to treat some cardiac arrhythmias (irregular heartbeats). During catheter ablation, a long, thin, flexible tube is put into a blood vessel in your groin (upper thigh), or neck. This tube is called an ablation catheter. It is then guided to your heart through the blood vessel. Radio frequency waves destroy small areas of heart tissue where abnormal heartbeats may cause an arrhythmia to start. Please see the instruction sheet given to you today.  Follow-Up: At [facility], you and your health needs are our priority.  As part of our continuing mission to provide you with exceptional heart care, we have created designated Provider Care Teams.  These Care Teams include your primary Cardiologist (physician) and Advanced Practice Providers (APPs -  Physician Assistants and Nurse Practitioners) who all work together to provide you with the care you need, when you need it.  Your next appointment:   4 weeks after your ablation  Provider:   Dr. Nobie Putnam

## 2024-01-06 DIAGNOSIS — I493 Ventricular premature depolarization: Secondary | ICD-10-CM

## 2024-01-06 DIAGNOSIS — I480 Paroxysmal atrial fibrillation: Secondary | ICD-10-CM | POA: Diagnosis not present

## 2024-01-06 DIAGNOSIS — R42 Dizziness and giddiness: Secondary | ICD-10-CM

## 2024-01-19 ENCOUNTER — Ambulatory Visit (INDEPENDENT_AMBULATORY_CARE_PROVIDER_SITE_OTHER): Payer: Medicare Other

## 2024-01-19 VITALS — Ht 71.5 in | Wt 158.0 lb

## 2024-01-19 DIAGNOSIS — Z0000 Encounter for general adult medical examination without abnormal findings: Secondary | ICD-10-CM | POA: Diagnosis not present

## 2024-01-19 DIAGNOSIS — Z1211 Encounter for screening for malignant neoplasm of colon: Secondary | ICD-10-CM

## 2024-01-19 DIAGNOSIS — Z1231 Encounter for screening mammogram for malignant neoplasm of breast: Secondary | ICD-10-CM | POA: Diagnosis not present

## 2024-01-19 NOTE — Patient Instructions (Signed)
 Jacob Marsh , Thank you for taking time to come for your Medicare Wellness Visit. I appreciate your ongoing commitment to your health goals. Please review the following plan we discussed and let me know if I can assist you in the future.   Referrals/Orders/Follow-Ups/Clinician Recommendations:   [facility] [address] [place] [address] [place],  [place]  [address] Main: [phone]   This is a list of the screening recommended for you and due dates:  Health Maintenance  Topic Date Due   Zoster (Shingles) Vaccine (1 of 2) Never done   Colon Cancer Screening  09/03/2022   DTaP/Tdap/Td vaccine (3 - Tdap) 12/20/2024*   COVID-19 Vaccine (5 - Moderna risk 2024-25 season) 02/04/2024   Medicare Annual Wellness Visit  01/18/2025   Pneumonia Vaccine  Completed   Flu Shot  Completed   Hepatitis C Screening  Completed   HPV Vaccine  Aged Out  *Topic was postponed. The date shown is not the original due date.    Advanced directives: (Copy Requested) Please bring a copy of your health care power of attorney and living will to the office to be added to your chart at your convenience. You can mail to [facility] [address] [place], [place] [address] or email to [email or web address]  Next Medicare Annual Wellness Visit scheduled for next year: Yes 01/19/25 @ 3pm televisit

## 2024-01-19 NOTE — Progress Notes (Signed)
 Subjective:   Jacob Marsh is a 73 y.o. who presents for a Medicare Wellness preventive visit.  Visit Complete: Virtual I connected with  Jacob Marsh on 01/19/24 by a audio enabled telemedicine application and verified that I am speaking with the correct person using two identifiers.  Patient Location: Home  Provider Location: Office/Clinic  I discussed the limitations of evaluation and management by telemedicine. The patient expressed understanding and agreed to proceed.  Vital Signs: Because this visit was a virtual/telehealth visit, some criteria may be missing or patient reported. Any vitals not documented were not able to be obtained and vitals that have been documented are patient reported.  VideoDeclined- This patient declined Librarian, academic. Therefore the visit was completed with audio only.  Persons Participating in Visit: Patient.  AWV Questionnaire: No: Patient Medicare AWV questionnaire was not completed prior to this visit.  Cardiac Risk Factors include: advanced age (>34men, >9 women);male gender     Objective:    Today's Vitals   01/19/24 1517  Weight: 158 lb (71.7 kg)  Height: 5' 11.5" (1.816 m)   Body mass index is 21.73 kg/m.     01/19/2024    3:30 PM 12/06/2023    8:54 PM 01/18/2023   11:04 AM 01/16/2022    1:03 PM 01/15/2021    1:06 PM 01/02/2020   12:13 PM 11/13/2019    6:16 AM  Advanced Directives  Does Patient Have a Medical Advance Directive? Yes Yes Yes Yes Yes Yes Yes  Type of Estate agent of [place];Living will Healthcare Power of [place];Living will Healthcare Power of [place];Living will Healthcare Power of [place];Living will Healthcare Power of [place];Living will Healthcare Power of [place];Living will Healthcare Power of [place];Living will  Does patient want to make changes to medical advance directive?  Yes (Inpatient - patient defers changing a medical advance directive and  declines information at this time)     No - Patient declined  Copy of Healthcare Power of Attorney in Chart? Yes - validated most recent copy scanned in chart (See row information) No - copy requested No - copy requested No - copy requested No - copy requested No - copy requested No - copy requested    Current Medications (verified) Outpatient Encounter Medications as of 01/19/2024  Medication Sig   acetaminophen (TYLENOL) 500 MG tablet Take 1,000 mg by mouth every 6 (six) hours as needed for moderate pain or headache.   b complex vitamins capsule Take 1 capsule by mouth in the morning and at bedtime.   Charcoal Activated 280 MG CAPS Take 2 capsules by mouth daily.   Cholecalciferol (VITAMIN D-3 PO) Take 200 mcg by mouth daily.   flecainide (TAMBOCOR) 50 MG tablet Take 1 tablet (50 mg total) by mouth 2 (two) times daily.   MAGNESIUM MALATE PO Take 360 mg by mouth in the morning and at bedtime.   Melatonin 1 MG CAPS Take 1.5 capsules by mouth at bedtime.   metoprolol tartrate (LOPRESSOR) 25 MG tablet TAKE 1/2 TABLET BY MOUTH EVERY NIGHT AT BEDTIME. MAY TAKE AN ADDITIONAL 1/2 TABLET AS NEEDED FOR PALPITATION   Multiple Vitamin (MULTIVITAMIN WITH MINERALS) TABS Take 1 tablet by mouth daily.    Omega-3 Fatty Acids (OMEGA-3 FISH OIL PO) Take 345 mg by mouth in the morning and at bedtime.   OVER THE COUNTER MEDICATION Take 1 tablet by mouth daily. NEUROMAG (Chelted Magnesium L-Thronate 50mg )   OVER THE COUNTER MEDICATION Take 2 capsules by  mouth daily. LION'S MANE MUSHROOM (450 MG)   OVER THE COUNTER MEDICATION Take 1 capsule by mouth daily. BRAIN VITALE   SACCHAROMYCES BOULARDII PO Take 420 mg by mouth in the morning and at bedtime.   Selenium 200 MCG CAPS Take 1 capsule by mouth daily.   TURMERIC CURCUMIN PO Take 500 mg by mouth in the morning and at bedtime.   ZINC PICOLINATE PO Take 25 mg by mouth in the morning and at bedtime.   No facility-administered encounter medications on file as of  01/19/2024.    Allergies (verified) Patient has no known allergies.   History: Past Medical History:  Diagnosis Date   A-fib (HCC) 10/17/2019   BPH (benign prostatic hyperplasia) 09/15/2016   Mild symptoms     Depression    Diverticulosis    History of diverticulosis// flex sig 2006//colonoscopy 01/21/2000 diverticulosis   Dupuytren contracture 12/29/2017   GERD (gastroesophageal reflux disease)    Globus sensation 07/17/2014   Hx of echocardiogram    Echo (9/14): EF 55%, grade 1 diastolic dysfunction, trivial AI, trivial MR, MAC, PASP 29   Palpitations 08/10/2013   Postural dizziness 03/20/2014   PVC (premature ventricular contraction)    PVCs (premature ventricular contractions) 03/20/2014   Trigger finger of both hands    Past Surgical History:  Procedure Laterality Date   A-FLUTTER ABLATION N/A 11/13/2019   Procedure: A-FLUTTER ABLATION;  Surgeon: Marinus Maw, MD;  Location: [facility];  Service: Cardiovascular;  Laterality: N/A;   DUPUYTREN CONTRACTURE RELEASE     HAND SURGERY Right 08/2020   TONSILLECTOMY     WISDOM TOOTH EXTRACTION     Family History  Problem Relation Age of Onset   Arthritis Mother    Heart disease Mother        ? atrial fib   Colon polyps Father    Depression Sister    Heart disease Maternal Grandmother        CAD   Cancer Maternal Grandmother        liver cancer   Heart disease Maternal Grandfather        CAD   Colon cancer Paternal Grandmother    Cancer - Colon Paternal Grandmother    Cancer Maternal Aunt        ?colon cancer   Colon cancer Paternal Uncle    Sudden death Neg Hx    Social History   Socioeconomic History   Marital status: Widowed    Spouse name: Not on file   Number of children: 3   Years of education: Not on file   Highest education level: Not on file  Occupational History   Occupation: works from home  Tobacco Use   Smoking status: Never   Smokeless tobacco: Never  Vaping Use   Vaping status: Never  Used  Substance and Sexual Activity   Alcohol use: Yes    Alcohol/week: 1.0 standard drink of alcohol    Types: 1 Glasses of wine per week    Comment: occ   Drug use: No   Sexual activity: Yes  Other Topics Concern   Not on file  Social History Narrative   ** Merged History Encounter **       Wife has alzheimer's and is living in a SNF since 2020 Still working as well  Children live in [place], [place], [place], [place], and [place], but they visit each other several times per year He's close with his sister who lives nearby   Social Drivers of  Health   Financial Resource Strain: Low Risk  (01/16/2022)   Overall Financial Resource Strain (CARDIA)    Difficulty of Paying Living Expenses: Not hard at all  Food Insecurity: No Food Insecurity (12/06/2023)   Hunger Vital Sign    Worried About Running Out of Food in the Last Year: Never true    Ran Out of Food in the Last Year: Never true  Transportation Needs: No Transportation Needs (01/19/2024)   PRAPARE - Administrator, Civil Service (Medical): No    Lack of Transportation (Non-Medical): No  Physical Activity: Sufficiently Active (01/19/2024)   Exercise Vital Sign    Days of Exercise per Week: 4 days    Minutes of Exercise per Session: 60 min  Stress: No Stress Concern Present (01/19/2024)   [employer] of Occupational Health - Occupational Stress Questionnaire    Feeling of Stress : Not at all  Social Connections: Socially Isolated (01/19/2024)   Social Connection and Isolation Panel [NHANES]    Frequency of Communication with Friends and Family: More than three times a week    Frequency of Social Gatherings with Friends and Family: Once a week    Attends Religious Services: Never    Database administrator or Organizations: No    Attends Banker Meetings: Never    Marital Status: Widowed    Tobacco Counseling Counseling given: Not Answered    Clinical Intake:  Pre-visit preparation completed:  Yes  Pain : No/denies pain     BMI - recorded: 21.73 Nutritional Status: BMI of 19-24  Normal Nutritional Risks: None Diabetes: No  Lab Results  Component Value Date   HGBA1C 5.4 09/14/2017   HGBA1C 5.3 09/10/2016   HGBA1C 5.2 03/20/2015     How often do you need to have someone help you when you read instructions, pamphlets, or other written materials from your doctor or pharmacy?: 1 - Never  Interpreter Needed?: No  Comments: lives dog/wife passed last year Information entered by :: B.Shaunta Oncale,LPN   Activities of Daily Living     01/19/2024    3:31 PM 12/06/2023    8:54 PM  In your present state of health, do you have any difficulty performing the following activities:  Hearing? 0 0  Vision? 0 0  Difficulty concentrating or making decisions? 1 0  Walking or climbing stairs? 0   Dressing or bathing? 0   Doing errands, shopping? 0 0  Preparing Food and eating ? N   Using the Toilet? N   In the past six months, have you accidently leaked urine? N   Do you have problems with loss of bowel control? N   Managing your Medications? N   Managing your Finances? N   Housekeeping or managing your Housekeeping? N     Patient Care Team: Tower, Audrie Gallus, MD as PCP - General Ladona Ridgel Doylene Canning, MD as PCP - Cardiology (Cardiology) Marinus Maw, MD as PCP - Electrophysiology (Cardiology) Wendall Stade, MD as Consulting Physician (Cardiology) Marinus Maw, MD as Consulting Physician (Cardiology) Sinda Du, MD as Consulting Physician (Ophthalmology) Cherlyn Roberts, MD as Consulting Physician (Dermatology) Casimer Leek, DDS, PA as Referring Physician (Dentistry)  Indicate any recent Medical Services you may have received from other than [facility] providers in the past year (date may be approximate).     Assessment:   This is a routine wellness examination for Eris.  Hearing/Vision screen Hearing Screening - Comments:: Pt says his hearing is good Vision  Screening  - Comments:: Pt says his vision is good;readers only Dr Pryor Curia   Goals Addressed             This Visit's Progress    Patient Stated   On track    01/19/2024 - I will continue to walk everyday for about 2-3 miles and lift weights 3x per week     Patient Stated       01/19/24- Gain 5 more pounds and stay active.       Depression Screen     01/19/2024    3:25 PM 12/21/2023   10:55 AM 05/31/2023   10:27 AM 01/18/2023   11:03 AM 01/16/2022    1:02 PM 01/15/2021    1:08 PM 01/02/2020   12:16 PM  PHQ 2/9 Scores  PHQ - 2 Score 0 1 2 0 0 0 0  PHQ- 9 Score  3 4   0 0    Fall Risk     01/19/2024    3:20 PM 05/31/2023   10:27 AM 01/18/2023   10:56 AM 01/16/2022   12:59 PM 01/15/2021    1:08 PM  Fall Risk   Falls in the past year? 1 1 1  0 0  Comment   Dog pushed patient over    Number falls in past yr: 1 1 0 0 0  Injury with Fall? 0 0 0 0 0  Risk for fall due to : No Fall Risks History of fall(s) No Fall Risks Orthopedic patient Medication side effect  Follow up Falls prevention discussed;Education provided Falls evaluation completed Falls prevention discussed;Falls evaluation completed Falls prevention discussed Falls evaluation completed;Falls prevention discussed    MEDICARE RISK AT HOME:  Medicare Risk at Home Any stairs in or around the home?: Yes If so, are there any without handrails?: Yes Home free of loose throw rugs in walkways, pet beds, electrical cords, etc?: Yes Adequate lighting in your home to reduce risk of falls?: Yes Life alert?: No Use of a cane, walker or w/c?: No Grab bars in the bathroom?: Yes Shower chair or bench in shower?: Yes Elevated toilet seat or a handicapped toilet?: Yes  TIMED UP AND GO:  Was the test performed?  No  Cognitive Function: 6CIT completed    01/15/2021    1:10 PM 01/02/2020   12:19 PM 09/14/2017    9:25 AM 09/10/2016    9:30 AM  MMSE - Mini Mental State Exam  Orientation to time 5 5 5 5   Orientation to Place 5 5 5 5    Registration 3 3 3 3   Attention/ Calculation 5 5 0 0  Recall 3 3 3 3   Language- name 2 objects   0 0  Language- repeat 1 1 1 1   Language- follow 3 step command   3 3  Language- read & follow direction   0 0  Write a sentence   0 0  Copy design   0 0  Total score   20 20        01/19/2024    3:38 PM 01/18/2023   11:06 AM  6CIT Screen  What Year?  0 points  What month? 0 points 0 points  What time? 0 points 0 points  Count back from 20 0 points 0 points  Months in reverse 0 points 0 points  Repeat phrase 0 points 0 points  Total Score  0 points    Immunizations Immunization History  Administered Date(s) Administered   Fluad Quad(high Dose  65+) 08/08/2019   Influenza Split 08/17/2012   Influenza, High Dose Seasonal PF 09/10/2018, 08/06/2023   Influenza,inj,Quad PF,6+ Mos 12/11/2014, 09/10/2016, 09/14/2017   Influenza-Unspecified 09/09/2021   Moderna Covid-19 Fall Seasonal Vaccine 60yrs & older 08/06/2023   Moderna SARS-COV2 Booster Vaccination 06/06/2021   Moderna Sars-Covid-2 Vaccination 11/28/2019, 12/26/2019, 08/23/2020   Pneumococcal Conjugate-13 09/10/2016   Pneumococcal Polysaccharide-23 12/11/2014   Td 09/26/1999, 03/19/2009    Screening Tests Health Maintenance  Topic Date Due   Zoster Vaccines- Shingrix (1 of 2) Never done   Colonoscopy  09/03/2022   DTaP/Tdap/Td (3 - Tdap) 12/20/2024 (Originally 03/20/2019)   COVID-19 Vaccine (5 - Moderna risk 2024-25 season) 02/04/2024   Medicare Annual Wellness (AWV)  01/18/2025   Pneumonia Vaccine 50+ Years old  Completed   INFLUENZA VACCINE  Completed   Hepatitis C Screening  Completed   HPV VACCINES  Aged Out    Health Maintenance  Health Maintenance Due  Topic Date Due   Zoster Vaccines- Shingrix (1 of 2) Never done   Colonoscopy  09/03/2022   Health Maintenance Items Addressed: Referral sent to GI for colonoscopy  Additional Screening:  Vision Screening: Recommended annual ophthalmology exams for early  detection of glaucoma and other disorders of the eye.  Dental Screening: Recommended annual dental exams for proper oral hygiene  Community Resource Referral / Chronic Care Management: CRR required this visit?  No   CCM required this visit?  No    Plan:     I have personally reviewed and noted the following in the patient's chart:   Medical and social history Use of alcohol, tobacco or illicit drugs  Current medications and supplements including opioid prescriptions. Patient is not currently taking opioid prescriptions. Functional ability and status Nutritional status Physical activity Advanced directives List of other physicians Hospitalizations, surgeries, and ER visits in previous 12 months Vitals Screenings to include cognitive, depression, and falls Referrals and appointments  In addition, I have reviewed and discussed with patient certain preventive protocols, quality metrics, and best practice recommendations. A written personalized care plan for preventive services as well as general preventive health recommendations were provided to patient.    Sue Lush, LPN   9/52/[id number]   After Visit Summary: (MyChart) Due to this being a telephonic visit, the after visit summary with patients personalized plan was offered to patient via MyChart   Notes: Nothing significant to report at this time.

## 2024-01-25 ENCOUNTER — Ambulatory Visit: Admitting: Rehabilitative and Restorative Service Providers"

## 2024-02-04 ENCOUNTER — Telehealth: Payer: Self-pay | Admitting: Cardiology

## 2024-02-04 DIAGNOSIS — I4819 Other persistent atrial fibrillation: Secondary | ICD-10-CM | POA: Diagnosis not present

## 2024-02-04 LAB — CBC
Hematocrit: 44.2 % (ref 37.5–51.0)
Hemoglobin: 14.7 g/dL (ref 13.0–17.7)
MCH: 32.7 pg (ref 26.6–33.0)
MCHC: 33.3 g/dL (ref 31.5–35.7)
MCV: 98 fL — ABNORMAL HIGH (ref 79–97)
Platelets: 153 10*3/uL (ref 150–450)
RBC: 4.49 x10E6/uL (ref 4.14–5.80)
RDW: 12.2 % (ref 11.6–15.4)
WBC: 5.5 10*3/uL (ref 3.4–10.8)

## 2024-02-04 NOTE — Telephone Encounter (Signed)
 Patient is calling stating that the lab orders were not released to [facility] for him to have them performed. He is going to the [facility] on [address] and would like a callback once orders have been released.   Please advise.

## 2024-02-04 NOTE — Telephone Encounter (Signed)
 Released the pts lab orders (CBC/BMET) in the system, so that [facility] see them to draw.  Tried calling the pt back to inform him of released labs and he did not answer.    Did leave him a detailed message on his confirmed voicemail, that all lab orders were released and he is good to go and get these drawn at his earliest convenience.  Advised him in the message to please call the office back with any additional questions/concerns regarding this matter.

## 2024-02-05 LAB — BASIC METABOLIC PANEL WITH GFR
BUN/Creatinine Ratio: 26 — ABNORMAL HIGH (ref 10–24)
BUN: 19 mg/dL (ref 8–27)
CO2: 23 mmol/L (ref 20–29)
Calcium: 9.5 mg/dL (ref 8.6–10.2)
Chloride: 100 mmol/L (ref 96–106)
Creatinine, Ser: 0.73 mg/dL — ABNORMAL LOW (ref 0.76–1.27)
Glucose: 80 mg/dL (ref 70–99)
Potassium: 4.2 mmol/L (ref 3.5–5.2)
Sodium: 138 mmol/L (ref 134–144)
eGFR: 95 mL/min/{1.73_m2} (ref >59)

## 2024-02-07 DIAGNOSIS — L814 Other melanin hyperpigmentation: Secondary | ICD-10-CM | POA: Diagnosis not present

## 2024-02-07 DIAGNOSIS — D225 Melanocytic nevi of trunk: Secondary | ICD-10-CM | POA: Diagnosis not present

## 2024-02-07 DIAGNOSIS — L570 Actinic keratosis: Secondary | ICD-10-CM | POA: Diagnosis not present

## 2024-02-07 DIAGNOSIS — L821 Other seborrheic keratosis: Secondary | ICD-10-CM | POA: Diagnosis not present

## 2024-02-08 ENCOUNTER — Telehealth (HOSPITAL_COMMUNITY): Payer: Self-pay

## 2024-02-08 NOTE — Telephone Encounter (Signed)
 Attempted to reach patient to discuss upcoming procedure, no answer. Left VM for patient to return call.

## 2024-02-09 DIAGNOSIS — I493 Ventricular premature depolarization: Secondary | ICD-10-CM

## 2024-02-09 DIAGNOSIS — I4819 Other persistent atrial fibrillation: Secondary | ICD-10-CM

## 2024-02-24 ENCOUNTER — Telehealth (HOSPITAL_COMMUNITY): Payer: Self-pay

## 2024-02-24 NOTE — Telephone Encounter (Signed)
 Attempted to reach patient to discuss upcoming procedure, no answer. Left VM for patient to return call.

## 2024-02-25 ENCOUNTER — Ambulatory Visit: Payer: Self-pay | Admitting: *Deleted

## 2024-02-25 NOTE — Telephone Encounter (Signed)
 Will see him then  ER and UC precautions please if symptoms worsen

## 2024-02-25 NOTE — Telephone Encounter (Signed)
 Copied from CRM [id number]. Topic: Clinical - Red Word Triage >> Feb 25, 2024  9:04 AM [name] wrote: Red Word that prompted transfer to Nurse Triage: Patient stated he fell two days ago and is concerned because he's in pain. Reason for Disposition  [1] MODERATE weakness (i.e., interferes with work, school, normal activities) AND [2] new-onset or worsening  Answer Assessment - Initial Assessment Questions 1. MECHANISM: "How did the fall happen?"     I fell playing pickleball.     I fell hard on left side.  Did not hit my head.    I was running and tripped and hit concrete court.    I'm worried about bruises and scraps.   I'm really sore in waist area from my back and around to the front.   Right where my right kidney I'm sore.    While in bed and move it's really painful.   No pain walking, sitting.    Hurts when using mid section.   All this is on the left side and in ribs and my midsection.  2. DOMESTIC VIOLENCE AND ELDER ABUSE SCREENING: "Did you fall because someone pushed you or tried to hurt you?" If Yes, ask: "Are you safe now?"     No 3. ONSET: "When did the fall happen?" (e.g., minutes, hours, or days ago)     2 days ago 4. LOCATION: "What part of the body hit the ground?" (e.g., back, buttocks, head, hips, knees, hands, head, stomach)     See above 5. INJURY: "Did you hurt (injure) yourself when you fell?" If Yes, ask: "What did you injure? Tell me more about this?" (e.g., body area; type of injury; pain severity)"     Yes    6. PAIN: "Is there any pain?" If Yes, ask: "How bad is the pain?" (e.g., Scale 1-10; or mild,  moderate, severe)   - NONE (0): No pain   - MILD (1-3): Doesn't interfere with normal activities    - MODERATE (4-7): Interferes with normal activities or awakens from sleep    - SEVERE (8-10): Excruciating pain, unable to do any normal activities      The pain is getting worse now and I'm really sore. 7. SIZE: For cuts, bruises, or swelling, ask: "How large is it?"  (e.g., inches or centimeters)      Bruises and scrapes 8. PREGNANCY: "Is there any chance you are pregnant?" "When was your last menstrual period?"     N/A 9. OTHER SYMPTOMS: "Do you have any other symptoms?" (e.g., dizziness, fever, weakness; new onset or worsening).      See above I'm for a PVC ablation next Thur.   I don't want this is affect that. 10. CAUSE: "What do you think caused the fall (or falling)?" (e.g., tripped, dizzy spell)       I stripped running after the ball.  Protocols used: Falls and Falling-A-AH  Chief Complaint: Jacob Marsh on Wed. Playing pickle ball and now having pain in left midsection and ribs with movement.   Also very sore.    Symptoms: Jacob Marsh hard on left side running after the ball Frequency: Happened Wed. Pertinent Negatives: Patient denies Pain with walking or sitting.  Mostly with moving. Disposition: [] ED /[] Urgent Care (no appt availability in office) / [x] Appointment(In office/virtual)/ []  [facility]/ [] Home Care/ [] Refused Recommended Disposition /[] [facility]/ []  Follow-up with PCP Additional Notes: Pt requested appt for Monday so made one with Dr. [name] for 10:00 on  Feb 28, 2024.

## 2024-02-28 ENCOUNTER — Encounter: Payer: Self-pay | Admitting: Family Medicine

## 2024-02-28 ENCOUNTER — Ambulatory Visit (INDEPENDENT_AMBULATORY_CARE_PROVIDER_SITE_OTHER)
Admission: RE | Admit: 2024-02-28 | Discharge: 2024-02-28 | Disposition: A | Discharge status: Home / Self Care | Source: Ambulatory Visit | Attending: Family Medicine | Admitting: Family Medicine

## 2024-02-28 ENCOUNTER — Ambulatory Visit (INDEPENDENT_AMBULATORY_CARE_PROVIDER_SITE_OTHER): Admitting: Family Medicine

## 2024-02-28 VITALS — BP 126/68 | HR 58 | Temp 97.5°F | Ht 71.5 in | Wt 156.4 lb

## 2024-02-28 DIAGNOSIS — Z23 Encounter for immunization: Secondary | ICD-10-CM | POA: Diagnosis not present

## 2024-02-28 DIAGNOSIS — S299XXA Unspecified injury of thorax, initial encounter: Secondary | ICD-10-CM | POA: Insufficient documentation

## 2024-02-28 DIAGNOSIS — T148XXA Other injury of unspecified body region, initial encounter: Secondary | ICD-10-CM | POA: Insufficient documentation

## 2024-02-28 DIAGNOSIS — S51012A Laceration without foreign body of left elbow, initial encounter: Secondary | ICD-10-CM | POA: Diagnosis not present

## 2024-02-28 DIAGNOSIS — S2242XA Multiple fractures of ribs, left side, initial encounter for closed fracture: Secondary | ICD-10-CM | POA: Diagnosis not present

## 2024-02-28 DIAGNOSIS — S51019A Laceration without foreign body of unspecified elbow, initial encounter: Secondary | ICD-10-CM | POA: Insufficient documentation

## 2024-02-28 NOTE — Patient Instructions (Addendum)
 Use ice on painful arm/shoulder  You can try heat on ribs   Ibuprofen- always take with     Xray of ribs/chest today  We will reach out with results   Tetanus shot today Tdap Use soap and water on the skin tear as needed to prevent infection    Do make your self take deep breaths several times an hour - hold a pillow over painful area   Update if not starting to improve in a week or if worsening

## 2024-02-28 NOTE — Progress Notes (Signed)
 Subjective:    Patient ID: Jacob Marsh, male    DOB: 1951-01-07, 73 y.o.   MRN: [id number]  HPI  Wt Readings from Last 3 Encounters:  02/28/24 156 lb 6 oz (70.9 kg)  01/19/24 158 lb (71.7 kg)  01/04/24 158 lb 3.2 oz (71.8 kg)   21.51 kg/m  Vitals:   02/28/24 1014  BP: 126/68  Pulse: (!) 58  Temp: (!) 97.5 F (36.4 C)  SpO2: 100%   Pt presents for c/o torso/chest injury playing pickle ball  Happened wednesday   Fell on left side -fell very hard  Running/ tripped  [place]crete court Did not hit head   Left arm is very bruised  Initially shoulder got swollen  Shoulder is sore but not severely so   Left chest wall/ribs bruised and sore  Hurts to move Really hurts to roll over in bed   Got worried about kidney   Did hurt to cough and take deep breath     Sore in waist area -back to frint   Tetanus shot   Moving to [place] to be with grandchildren soon   Imaging today  DG Ribs Unilateral Left Result Date: 02/28/2024 CLINICAL DATA:  Left chest wall pain after trauma. EXAM: LEFT RIBS - 2 VIEW; CHEST - 2 VIEW COMPARISON:  Chest radiographs 12/06/2023 and 08/12/2012. FINDINGS: The heart size and mediastinal contours are stable. The lungs appear clear. No pleural effusion or pneumothorax. There are acute, nondisplaced fractures of the left 9th and 10th ribs laterally. No displaced fractures or aggressive osseous lesions are identified. IMPRESSION: Acute, nondisplaced fractures of the left 9th and 10th ribs laterally. No pneumothorax or pleural effusion. Electronically Signed   By: Elmon Hagedorn M.D.   On: 02/28/2024 11:25   DG Chest 2 View Result Date: 02/28/2024 CLINICAL DATA:  Left chest wall pain after trauma. EXAM: LEFT RIBS - 2 VIEW; CHEST - 2 VIEW COMPARISON:  Chest radiographs 12/06/2023 and 08/12/2012. FINDINGS: The heart size and mediastinal contours are stable. The lungs appear clear. No pleural effusion or pneumothorax. There are acute, nondisplaced fractures of  the left 9th and 10th ribs laterally. No displaced fractures or aggressive osseous lesions are identified. IMPRESSION: Acute, nondisplaced fractures of the left 9th and 10th ribs laterally. No pneumothorax or pleural effusion. Electronically Signed   By: Elmon Hagedorn M.D.   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   Vitamin D  deficiency 02/18/2021   Elevated TSH 02/18/2021   A-fib (HCC) 10/17/2019   Dupuytren contracture 12/29/2017   Trigger finger of both hands    Hx of echocardiogram    Diverticulosis    BPH (benign prostatic hyperplasia) 09/15/2016   Need for hepatitis C screening test 09/07/2016   Encounter for Medicare annual wellness exam 12/11/2014   Screening for lipoid disorders 12/03/2014   PVCs (premature ventricular contractions) 03/20/2014   Postural dizziness 03/20/2014   Palpitations 08/10/2013   History of chest pain 08/17/2012   Routine general medical examination at a health care facility 08/09/2012   Prostate cancer screening 08/09/2012   Thrombocytopenia (HCC) 07/18/2009   Past Medical History:  Diagnosis Date   A-fib (HCC) 10/17/2019  BPH (benign prostatic hyperplasia) 09/15/2016   Mild symptoms     Depression    Diverticulosis    History of diverticulosis// flex sig 2006//colonoscopy 01/21/2000 diverticulosis   Dupuytren contracture 12/29/2017   GERD (gastroesophageal reflux disease)    Globus sensation 07/17/2014   Hx of echocardiogram    Echo (9/14): EF 55%, grade 1 diastolic dysfunction, trivial AI, trivial MR, MAC, PASP 29   Palpitations 08/10/2013   Postural dizziness 03/20/2014   PVC (premature ventricular contraction)    PVCs (premature ventricular  contractions) 03/20/2014   Trigger finger of both hands    Past Surgical History:  Procedure Laterality Date   A-FLUTTER ABLATION N/A 11/13/2019   Procedure: A-FLUTTER ABLATION;  Surgeon: Tammie Fall, MD;  Location: [facility];  Service: Cardiovascular;  Laterality: N/A;   DUPUYTREN CONTRACTURE RELEASE     HAND SURGERY Right 08/2020   TONSILLECTOMY     WISDOM TOOTH EXTRACTION     Social History   Tobacco Use   Smoking status: Never   Smokeless tobacco: Never  Vaping Use   Vaping status: Never Used  Substance Use Topics   Alcohol use: Yes    Alcohol/week: 1.0 standard drink of alcohol    Types: 1 Glasses of wine per week    Comment: occ   Drug use: No   Family History  Problem Relation Age of Onset   Arthritis Mother    Heart disease Mother        ? atrial fib   Colon polyps Father    Depression Sister    Heart disease Maternal Grandmother        CAD   Cancer Maternal Grandmother        liver cancer   Heart disease Maternal Grandfather        CAD   Colon cancer Paternal Grandmother    Cancer - Colon Paternal Grandmother    Cancer Maternal Aunt        ?colon cancer   Colon cancer Paternal Uncle    Sudden death Neg Hx    No Known Allergies Current Outpatient Medications on File Prior to Visit  Medication Sig Dispense Refill   acetaminophen  (TYLENOL ) 500 MG tablet Take 1,000 mg by mouth every 6 (six) hours as needed for moderate pain or headache.     b complex vitamins capsule Take 1 capsule by mouth in the morning and at bedtime.     Charcoal Activated 280 MG CAPS Take 560 mg by mouth daily.     Cholecalciferol (VITAMIN D -3 PO) Take 3 drops by mouth daily. 2 drops =1000 units (25 mcg)     flecainide  (TAMBOCOR ) 50 MG tablet Take 1 tablet (50 mg total) by mouth 2 (two) times daily. 180 tablet 3   MAGNESIUM  MALATE PO Take 100 mg by mouth daily.     MELATONIN PO Take 1.5 capsules by mouth at bedtime.     metoprolol  tartrate (LOPRESSOR ) 25 MG tablet TAKE 1/2  TABLET BY MOUTH EVERY NIGHT AT BEDTIME. MAY TAKE AN ADDITIONAL 1/2 TABLET AS NEEDED FOR PALPITATION 90 tablet 3   Multiple Vitamin (MULTIVITAMIN WITH MINERALS) TABS Take 1 tablet by mouth daily.      Omega-3 Fatty Acids (OMEGA-3 FISH OIL PO) Take 720 mg by mouth in the morning and at bedtime.     OVER THE COUNTER MEDICATION Take 145 mg by mouth daily. NEUROMAG (Chelted Magnesium  L-Thronate 50mg )     SACCHAROMYCES BOULARDII PO Take 420 mg by mouth in  the morning and at bedtime. Probiotic     Selenium 200 MCG CAPS Take 200 mcg by mouth daily.     Turmeric Curcumin 500 MG CAPS Take 1,000 mg by mouth 2 (two) times daily.     ZINC  PICOLINATE PO Take 25 mg by mouth in the morning and at bedtime.     No current facility-administered medications on file prior to visit.    Review of Systems     Objective:   Physical Exam Pulmonary:     Effort: No respiratory distress.     Breath sounds: No stridor. No wheezing, rhonchi or rales.     Comments: Left lateral rib tenderness w/o step off or crepitus  Chest:     Chest wall: Tenderness present.  Musculoskeletal:     Comments: 2 by 3 hematoma on superior left shoulder-non tender Old bruising from shoulder to elbow  1 cm clean skin tear on left elbow  Full rom of shoulder Some pain to abduct past 90 deg  Neg hawking and neer tests   Right shoulder-positive neer test but overall good rom               Assessment & Plan:   Problem List Items Addressed This Visit       Musculoskeletal and Integument   Skin tear of elbow without complication   Small skin tear  Due for tetanus (choose Tdap since daughter is expecting new baby)   Encouraged use of soap and water  Update if not starting to improve in a week or if worsening  Call back and Er precautions noted in detail today        Relevant Orders   Tdap vaccine greater than or equal to 7yo IM (Completed)     Other   Hematoma   On superior left shoulder after a fall with bruising   Not very tender and rom of shoulder is good Will watch  Encouraged use of ice  Use as tolerated       Relevant Orders   Tdap vaccine greater than or equal to 7yo IM (Completed)   Chest wall injury - Primary   Rib fractures- left lateral 9,10th today from fall onto his side playing pickleball Not displaced and lungs look ok on xray   Encouraged  Use of heat /ice  Supported deep breaths as tolerated to prevent atelectasis  Analgesics prn  Expect slow healing  Watch for signs and symptoms of pneumothorax -sudden pain or shortness of breath Avoid painful activities and positions   Update if not starting to improve in a week or if worsening  Call back and Er precautions noted in detail today        Relevant Orders   DG Ribs Unilateral Left (Completed)   DG Chest 2 View (Completed)   Tdap vaccine greater than or equal to 7yo IM (Completed)

## 2024-02-28 NOTE — Therapy (Signed)
 OUTPATIENT PHYSICAL THERAPY THORACOLUMBAR EVALUATION   Patient Name: Jacob Marsh MRN: [id number] DOB:18-Jun-1951, 73 y.o., male Today's Date: 02/28/2024  END OF SESSION:   Past Medical History:  Diagnosis Date   A-fib (HCC) 10/17/2019   BPH (benign prostatic hyperplasia) 09/15/2016   Mild symptoms     Depression    Diverticulosis    History of diverticulosis// flex sig 2006//colonoscopy 01/21/2000 diverticulosis   Dupuytren contracture 12/29/2017   GERD (gastroesophageal reflux disease)    Globus sensation 07/17/2014   Hx of echocardiogram    Echo (9/14): EF 55%, grade 1 diastolic dysfunction, trivial AI, trivial MR, MAC, PASP 29   Palpitations 08/10/2013   Postural dizziness 03/20/2014   PVC (premature ventricular contraction)    PVCs (premature ventricular contractions) 03/20/2014   Trigger finger of both hands    Past Surgical History:  Procedure Laterality Date   A-FLUTTER ABLATION N/A 11/13/2019   Procedure: A-FLUTTER ABLATION;  Surgeon: Tammie Fall, MD;  Location: [facility];  Service: Cardiovascular;  Laterality: N/A;   DUPUYTREN CONTRACTURE RELEASE     HAND SURGERY Right 08/2020   TONSILLECTOMY     WISDOM TOOTH EXTRACTION     Patient Active Problem List   Diagnosis Date Noted   Hematoma 02/28/2024   Skin tear of elbow without complication 02/28/2024   Chest wall injury 02/28/2024   Low back pain 12/21/2023   Fatigue 12/21/2023   Dizziness 12/06/2023   Family history of colon cancer 06/19/2022   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   Vitamin D  deficiency 02/18/2021   Elevated TSH 02/18/2021   A-fib (HCC) 10/17/2019   Dupuytren contracture 12/29/2017   Trigger finger of both hands    Hx of echocardiogram    Diverticulosis    BPH (benign prostatic hyperplasia) 09/15/2016   Need for hepatitis C screening test 09/07/2016   Encounter for Medicare annual wellness exam 12/11/2014   Screening for lipoid disorders 12/03/2014   PVCs (premature ventricular  contractions) 03/20/2014   Postural dizziness 03/20/2014   Palpitations 08/10/2013   History of chest pain 08/17/2012   Routine general medical examination at a health care facility 08/09/2012   Prostate cancer screening 08/09/2012   Thrombocytopenia (HCC) 07/18/2009    PCP: ***  REFERRING PROVIDER: ***  REFERRING DIAG: ***  Rationale for Evaluation and Treatment: {HABREHAB:27488}  THERAPY DIAG:  No diagnosis found.  ONSET DATE: ***  SUBJECTIVE:                                                                                                                                                                                           SUBJECTIVE STATEMENT: ***  PERTINENT HISTORY:  ***  PAIN:  NPRS scale: ***/10 Pain location: *** Pain description: *** Aggravating factors: *** Relieving factors: ***  PRECAUTIONS: {Therapy precautions:24002}  WEIGHT BEARING RESTRICTIONS: {Yes ***/No:24003}  FALLS:  Has patient fallen in last 6 months? {fallsyesno:27318}  LIVING ENVIRONMENT: Lives with: {OPRC lives with:25569::"lives with their family"} Lives in: {Lives in:25570} Stairs: {opstairs:27293} Has following equipment at home: {Assistive devices:23999}  OCCUPATION: ***  PLOF: Independent  PATIENT GOALS: ***  Next MD Visit:    OBJECTIVE:   DIAGNOSTIC FINDINGS:  ***  PATIENT SURVEYS:  Patient-Specific Activity Scoring Scheme  "0" represents "unable to perform." "10" represents "able to perform at prior level. 0 1 2 3 4 5 6 7 8 9  10 (Date and Score)   Activity Eval  02/29/2024    1. ***      2. ***      3. ***    4.    5.    Score ***    Total score = sum of the activity scores/number of activities Minimum detectable change (90%CI) for average score = 2 points Minimum detectable change (90%CI) for single activity score = 3 points  SCREENING FOR RED FLAGS: Bowel or bladder incontinence: {EXB/MW:413244010} Cauda equina syndrome:  {UVO/ZD:664403474}  COGNITION: 02/29/2024 Overall cognitive status: WFL normal      SENSATION: 02/29/2024 {sensation:27233}  MUSCLE LENGTH: 02/29/2024 Hamstrings: Right *** deg; Left *** deg Andy Bannister test: Right *** deg; Left *** deg  POSTURE:  02/29/2024 {posture:25561}  PALPATION: 02/29/2024 ***  LUMBAR ROM:   Directional Preference Assessment: Centralization: Peripheralization:   AROM Eval 02/29/2024  Flexion   Extension   Right lateral flexion   Left lateral flexion   Right rotation   Left rotation    (Blank rows = not tested)  LOWER EXTREMITY ROM:      Right Eval 02/29/2024 Left Eval 02/29/2024  Hip flexion    Hip extension    Hip abduction    Hip adduction    Hip internal rotation    Hip external rotation    Knee flexion    Knee extension    Ankle dorsiflexion    Ankle plantarflexion    Ankle inversion    Ankle eversion     (Blank rows = not tested)  LOWER EXTREMITY MMT:    MMT Right Eval 02/29/2024 Left Eval 02/29/2024  Hip flexion    Hip extension    Hip abduction    Hip adduction    Hip internal rotation    Hip external rotation    Knee flexion    Knee extension    Ankle dorsiflexion    Ankle plantarflexion    Ankle inversion    Ankle eversion     (Blank rows = not tested)  LUMBAR SPECIAL TESTS:  02/29/2024 {lumbar special test:25242}  FUNCTIONAL TESTS:  02/29/2024 {Functional tests:24029}  GAIT: 02/29/2024  TODAY'S TREATMENT:                                                                                                         DATE: 02/29/2024  Therex:    HEP instruction/performance c cues for techniques, handout provided.  Trial set performed of each for comprehension and symptom assessment.  See below for exercise list  PATIENT  EDUCATION:  02/29/2024 Education details: HEP, POC Person educated: Patient Education method: Explanation, Demonstration, Verbal cues, and Handouts Education comprehension: verbalized understanding, returned demonstration, and verbal cues required  HOME EXERCISE PROGRAM: ***  ASSESSMENT:  CLINICAL IMPRESSION: Patient is a 73 y.o. who comes to clinic with complaints of ***pain with mobility, strength and movement coordination deficits that impair their ability to perform usual daily and recreational functional activities without increase difficulty/symptoms at this time.  Patient to benefit from skilled PT services to address impairments and limitations to improve to previous level of function without restriction secondary to condition.   OBJECTIVE IMPAIRMENTS: {opptimpairments:25111}.   ACTIVITY LIMITATIONS: {activitylimitations:27494}  PARTICIPATION LIMITATIONS: {participationrestrictions:25113}  PERSONAL FACTORS: {Personal factors:25162} are also affecting patient's functional outcome.   REHAB POTENTIAL: {rehabpotential:25112}  CLINICAL DECISION MAKING: {clinical decision making:25114}  EVALUATION COMPLEXITY: {Evaluation complexity:25115}   GOALS: Goals reviewed with patient? Yes  SHORT TERM GOALS: (target date for Short term goals are 3 weeks 03/21/2024)  1. Patient will demonstrate independent use of home exercise program to maintain progress from in clinic treatments.  Goal status: New  LONG TERM GOALS: (target dates for all long term goals are 10 weeks  05/09/2024 )   1. Patient will demonstrate/report pain at worst less than or equal to 2/10 to facilitate minimal limitation in daily activity secondary to pain symptoms.  Goal status: New   2. Patient will demonstrate independent use of home exercise program to facilitate ability to maintain/progress functional gains from skilled physical therapy services.  Goal status: New   3. Patient will demonstrate Patient  specific functional scale avg > or = *** to indicate reduced disability due to condition.   Goal status: New   4. Patient will demonstrate lumbar extension 100 % WFL s symptoms to facilitate upright standing, walking posture at PLOF s limitation.  Goal status: New   5.  ***  Goal status: New   6.  *** Goal status: New   7.  *** Goal Status: New  PLAN:  PT FREQUENCY: 1-2x/week  PT DURATION: 10 weeks  PLANNED INTERVENTIONS: Can include 16109- PT Re-evaluation, 97110-Therapeutic exercises, 97530- Therapeutic activity, 97112- Neuromuscular re-education, 97535- Self Care, 97140- Manual therapy, [phone]- Gait training, [phone]- Orthotic Fit/training, [phone]- Canalith repositioning, [id number]- Aquatic Therapy, [phone]- Electrical stimulation (unattended), 97750 Physical performance testing, [id number]- Electrical stimulation (manual), 97016- Vasopneumatic device, [id number]- Ultrasound, [id number]- Traction (mechanical), [id number]- Ionotophoresis 4mg /ml Dexamethasone, Patient/Family education, Balance training, Stair training, Taping, Dry Needling, Joint mobilization, Joint manipulation, Spinal manipulation, Spinal mobilization, Scar mobilization, Vestibular training, Visual/preceptual remediation/compensation, DME instructions, Cryotherapy, and Moist heat.  All performed as medically necessary.  All included unless contraindicated  PLAN FOR NEXT SESSION: Review HEP knowledge/results.  Bonna Bustard, PT, DPT, OCS, ATC 02/28/24  4:25 PM

## 2024-02-28 NOTE — Assessment & Plan Note (Signed)
 On superior left shoulder after a fall with bruising  Not very tender and rom of shoulder is good Will watch  Encouraged use of ice  Use as tolerated

## 2024-02-28 NOTE — Assessment & Plan Note (Signed)
 Rib fractures- left lateral 9,10th today from fall onto his side playing pickleball Not displaced and lungs look ok on xray   Encouraged  Use of heat /ice  Supported deep breaths as tolerated to prevent atelectasis  Analgesics prn  Expect slow healing  Watch for signs and symptoms of pneumothorax -sudden pain or shortness of breath Avoid painful activities and positions   Update if not starting to improve in a week or if worsening  Call back and Er precautions noted in detail today

## 2024-02-28 NOTE — Assessment & Plan Note (Signed)
 Small skin tear  Due for tetanus (choose Tdap since daughter is expecting new baby)   Encouraged use of soap and water  Update if not starting to improve in a week or if worsening  Call back and Er precautions noted in detail today

## 2024-02-29 ENCOUNTER — Telehealth: Payer: Self-pay | Admitting: Internal Medicine

## 2024-02-29 ENCOUNTER — Ambulatory Visit (INDEPENDENT_AMBULATORY_CARE_PROVIDER_SITE_OTHER): Admitting: Rehabilitative and Restorative Service Providers"

## 2024-02-29 ENCOUNTER — Encounter: Payer: Self-pay | Admitting: Rehabilitative and Restorative Service Providers"

## 2024-02-29 DIAGNOSIS — M5459 Other low back pain: Secondary | ICD-10-CM

## 2024-02-29 NOTE — Telephone Encounter (Signed)
 Patient is requesting to speak with a nurse. Patient stated he has information to give in regard to his upcoming procedure. Please advise.

## 2024-02-29 NOTE — Telephone Encounter (Signed)
 Left message for patient to call back

## 2024-02-29 NOTE — Telephone Encounter (Signed)
Follow Up:      Patient is returning call from today. 

## 2024-02-29 NOTE — Telephone Encounter (Signed)
 Received a return call from patient to discuss upcoming procedure.   Labs: completed.   Any recent signs of acute illness or been started on antibiotics?  Patient fell on left side playing Pickleball, fracturing several ribs. Reports improvement with symptoms when up moving, but pain present when laying down. He would like to proceed with procedure, if possible. Message already sent from triage for Dr. Daneil Dunker to advise.  Any new medications started? No Any medications to hold?  Hold Flecainide  and Metoprolol  3 days Medication instructions:  On the morning of your procedure DO NOT take any medication.or the procedure may be rescheduled. Nothing to eat or drink after midnight prior to your procedure.  Confirmed patient is scheduled for  PVC Ablation  on Thursday, May 8 with Dr. Clinton Danas. Instructed patient to arrive at the Main Entrance A at [facility]: [address] [place], [place] [address] and check in at Admitting at 10:30 AM.  Advised of plan to go home the same day and will only stay overnight if medically necessary. You MUST have a responsible adult to drive you home and MUST be with you the first 24 hours after you arrive home or your procedure could be cancelled.  Patient verbalized understanding to all instructions provided and agreed to proceed with procedure.

## 2024-02-29 NOTE — Telephone Encounter (Signed)
 Ardeen Kohler, MD to Me  Wyn Heater, RN  JP   02/29/24 12:34 PM I think we should allow more time for ribs to heal. We will have to give IV heparin  and potentially have him on short course of anti-coagulation after, which he cannot take with NSAIDs. Also have to be prepared for things like chest compressions if there are complications which would not be ideal in setting of fresh rib fracture. Would recommend delaying 4-6 weeks.  Thanks, [employer] patient and let him know that his procedure will be postponed and someone will call him to reschedule.

## 2024-02-29 NOTE — Telephone Encounter (Signed)
 Patient called to let Dr. Daneil Dunker and his nurse know that he was playing pickle ball and fell on his left side last Wednesday. Patient now has two fractured ribs, non-displace fractured 9th and 10th rib on left side. Patient stated PCP stated he was cleared to have procedure from her point of view. Patient has several chest xrays on his chart that PCP had done. Patient wants to know if it is okay to take ibuprofen for his pain, he only needs to take at night, due to having pain while laying down. Patient wants to make sure this does not delay his procedure.

## 2024-02-29 NOTE — Telephone Encounter (Signed)
 Spoke with the patient and rescheduled his procedure for 6/25.

## 2024-03-13 ENCOUNTER — Encounter: Admitting: Rehabilitative and Restorative Service Providers"

## 2024-03-13 ENCOUNTER — Telehealth: Payer: Self-pay | Admitting: Rehabilitative and Restorative Service Providers"

## 2024-03-13 NOTE — Therapy (Incomplete)
 OUTPATIENT PHYSICAL THERAPY EVALUATION   Patient Name: Jacob Marsh MRN: [id number] DOB:1951-05-02, 73 y.o., male Today's Date: 03/13/2024  END OF SESSION:    Past Medical History:  Diagnosis Date   A-fib (HCC) 10/17/2019   BPH (benign prostatic hyperplasia) 09/15/2016   Mild symptoms     Depression    Diverticulosis    History of diverticulosis// flex sig 2006//colonoscopy 01/21/2000 diverticulosis   Dupuytren contracture 12/29/2017   GERD (gastroesophageal reflux disease)    Globus sensation 07/17/2014   Hx of echocardiogram    Echo (9/14): EF 55%, grade 1 diastolic dysfunction, trivial AI, trivial MR, MAC, PASP 29   Palpitations 08/10/2013   Postural dizziness 03/20/2014   PVC (premature ventricular contraction)    PVCs (premature ventricular contractions) 03/20/2014   Trigger finger of both hands    Past Surgical History:  Procedure Laterality Date   A-FLUTTER ABLATION N/A 11/13/2019   Procedure: A-FLUTTER ABLATION;  Surgeon: Tammie Fall, MD;  Location: [facility];  Service: Cardiovascular;  Laterality: N/A;   DUPUYTREN CONTRACTURE RELEASE     HAND SURGERY Right 08/2020   TONSILLECTOMY     WISDOM TOOTH EXTRACTION     Patient Active Problem List   Diagnosis Date Noted   Hematoma 02/28/2024   Skin tear of elbow without complication 02/28/2024   Chest wall injury 02/28/2024   Low back pain 12/21/2023   Fatigue 12/21/2023   Dizziness 12/06/2023   Family history of colon cancer 06/19/2022   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   Vitamin D  deficiency 02/18/2021   Elevated TSH 02/18/2021   A-fib (HCC) 10/17/2019   Dupuytren contracture 12/29/2017   Trigger finger of both hands    Hx of echocardiogram    Diverticulosis    BPH (benign prostatic hyperplasia) 09/15/2016   Need for hepatitis C screening test 09/07/2016   Encounter for Medicare annual wellness exam 12/11/2014   Screening for lipoid disorders 12/03/2014   PVCs (premature ventricular contractions)  03/20/2014   Postural dizziness 03/20/2014   Palpitations 08/10/2013   History of chest pain 08/17/2012   Routine general medical examination at a health care facility 08/09/2012   Prostate cancer screening 08/09/2012   Thrombocytopenia (HCC) 07/18/2009    PCP: Clemens Curt, MD  REFERRING PROVIDER: Clemens Curt, MD  REFERRING DIAG: M54.50,G89.29 (ICD-10-CM) - Chronic midline low back pain without sciatica  Rationale for Evaluation and Treatment: Rehabilitation  THERAPY DIAG:  No diagnosis found.  ONSET DATE: 2024  SUBJECTIVE:  SUBJECTIVE STATEMENT: Insidious worsening of back symptoms indicated over  last year or so.   Pt indicated noticing pain complaints after lifting/carrying and can feel muscle tightness pain.  Pt indicated symptoms can be variable with tightness.  Pt denied radicular based symptoms upon question today.  Pt indicated symptoms can get moderate to severe and take about a day to recover.  Pt indicated sometimes waking due to pain in back with stiffness noted.   PERTINENT HISTORY:  Depression, GERD. Pt reported recent fall while playing pickleball that resulted in two fracture ribs 9/10 on Lt side.   PVC ablation on Thursday.   PAIN:  NPRS scale: at worst 7/10, 2/10 Pain location: back lumbar bilaterally  Pain description: tightness, sharp occasionally , mainly discomfort.  Aggravating factors: lifting/carrying items Relieving factors: stretching, resting from lifting  PRECAUTIONS: None  WEIGHT BEARING RESTRICTIONS: No  FALLS:  Has patient fallen in last 6 months? Yes 1 with ribs injury.   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   PLOF: Independent, walking routine, pickle ball ,  stretching routine.  Has dog to walk/hike with.   Has flexible dumbbells weights  PATIENT GOALS: Reduce pain, get upper body strength back  OBJECTIVE:   PATIENT SURVEYS:  Patient-Specific Activity Scoring Scheme  "0" represents "unable to perform." "10" represents "able to perform at prior level. 0 1 2 3 4 5 6 7 8 9  10 (Date and Score)   Activity Eval  02/29/2024    1. Lifting objects  6    2. Bending  6     3.     4.    5.    Score 6 avg    Total score = sum of the activity scores/number of activities Minimum detectable change (90%CI) for average score = 2 points Minimum detectable change (90%CI) for single activity score = 3 points  SCREENING FOR RED FLAGS: Bowel or bladder incontinence: No Cauda equina syndrome: No  COGNITION: 02/29/2024 Overall cognitive status: WFL normal      SENSATION: 02/29/2024 Unremarkable  MUSCLE LENGTH: 02/29/2024 Lt and Rt hamstring passive SLR to 80 deg without back complaints.   POSTURE:  02/29/2024 Reduced lumbar lordosis in standing noted.  Equal iliac crest noted.   PALPATION: 02/29/2024 No specific tenderness to touch in lumbar.   LUMBAR ROM:  02/29/2024 Directional Preference Assessment: Centralization: not observed Peripheralization:  not observed  AROM Eval 02/29/2024  Flexion To ankles with reduced tightness  Extension 50% WFL with some tightness  Right lateral flexion To middle thigh with tightness noted  Left lateral flexion To middle thigh with tightness noted  Right rotation   Left rotation    (Blank rows = not tested)  LOWER EXTREMITY ROM:      Right Eval 02/29/2024 Left Eval 02/29/2024  Hip flexion    Hip extension    Hip abduction    Hip adduction    Hip internal rotation    Hip external rotation    Knee flexion    Knee extension    Ankle dorsiflexion    Ankle plantarflexion    Ankle inversion    Ankle eversion     (Blank rows = not tested)  LOWER EXTREMITY MMT:    MMT Right Eval 02/29/2024  Left Eval 02/29/2024  Hip flexion    Hip extension    Hip abduction    Hip adduction    Hip internal rotation    Hip external  rotation    Knee flexion    Knee extension    Ankle dorsiflexion    Ankle plantarflexion    Ankle inversion    Ankle eversion     (Blank rows = not tested)  LUMBAR SPECIAL TESTS:  02/29/2024 No specific testing today.   Held passive accessory mobility tesitng due to rib complaints.   FUNCTIONAL TESTS:  02/29/2024 No specific testing today  GAIT: 02/29/2024 Independent ambulation                                                                                                                                                                                                                   TODAY'S TREATMENT:                                                                                                         DATE: 03/13/2024  Therex:  TODAY'S TREATMENT:                                                                                                         DATE: 02/29/2024  Therex:    HEP instruction/performance c cues for techniques, handout provided.  Trial set performed of each for comprehension and symptom assessment.  See below for exercise list  PATIENT EDUCATION:  02/29/2024 Education details: HEP, POC Person educated: Patient Education method: Explanation, Demonstration, Verbal cues, and Handouts Education comprehension: verbalized understanding, returned demonstration, and verbal cues required  HOME EXERCISE PROGRAM: Access Code: [email or web address] URL: [email or web address] Date: 02/29/2024 Prepared by: Bonna Bustard  Exercises - Supine Lower Trunk Rotation  - 2 x daily - 7 x weekly - 1 sets -  3-5 reps - 15 hold - Supine Figure 4 Piriformis Stretch  - 2 x daily - 7 x weekly - 1 sets - 5 reps - 30 hold - Supine Piriformis Stretch with Foot on Ground  - 2 x daily - 7 x weekly - 1 sets - 5 reps - 30 hold - Supine Bridge  - 1-2 x daily - 7 x  weekly - 1-2 sets - 10 reps - 2 hold - Standing Lumbar Extension with Counter  - 3-5 x daily - 7 x weekly - 1 sets - 5 reps  ASSESSMENT:  CLINICAL IMPRESSION: Patient is a 73 y.o. who comes to clinic with complaints of low back pain with mobility deficits that impair their ability to perform usual daily and recreational functional activities without increase difficulty/symptoms at this time.  Patient to benefit from skilled PT services to address impairments and limitations to improve to previous level of function without restriction secondary to condition.   OBJECTIVE IMPAIRMENTS: Abnormal gait, decreased activity tolerance, decreased endurance, decreased mobility, decreased ROM, increased fascial restrictions, impaired perceived functional ability, increased muscle spasms, impaired flexibility, improper body mechanics, postural dysfunction, and pain.   ACTIVITY LIMITATIONS: carrying, lifting, and bending  PARTICIPATION LIMITATIONS: interpersonal relationship and community activity  PERSONAL FACTORS: Concurrent rib injury are also affecting patient's functional outcome.   REHAB POTENTIAL: Good  CLINICAL DECISION MAKING: Stable/uncomplicated  EVALUATION COMPLEXITY: Low   GOALS: Goals reviewed with patient? Yes  SHORT TERM GOALS: (target date for Short term goals are 3 weeks 03/21/2024)  1. Patient will demonstrate independent use of home exercise program to maintain progress from in clinic treatments.  Goal status: New  LONG TERM GOALS: (target dates for all long term goals are 10 weeks  05/09/2024 )   1. Patient will demonstrate/report pain at worst less than or equal to 2/10 to facilitate minimal limitation in daily activity secondary to pain symptoms.  Goal status: New   2. Patient will demonstrate independent use of home exercise program to facilitate ability to maintain/progress functional gains from skilled physical therapy services.  Goal status: New   3. Patient will  demonstrate Patient specific functional scale avg > or = 8/10 to indicate reduced disability due to condition.   Goal status: New   4. Patient will demonstrate lumbar extension 100 % WFL s symptoms to facilitate upright standing, walking posture at PLOF s limitation.  Goal status: New   5.  Patient will demonstrate/report ability to perform usual house and workout activity at PLOF s limitation.   Goal status: New   PLAN:  PT FREQUENCY: 1-2x/week  PT DURATION: 10 weeks  PLANNED INTERVENTIONS: Can include 54098- PT Re-evaluation, 97110-Therapeutic exercises, 97530- Therapeutic activity, 97112- Neuromuscular re-education, [id number]- Self Care, 97140- Manual therapy, [phone]- Gait training, [phone]- Orthotic Fit/training, [phone]- Canalith repositioning, [id number]- Aquatic Therapy, [phone]- Electrical stimulation (unattended), 97750 Physical performance testing, [id number]- Electrical stimulation (manual), 97016- Vasopneumatic device, [id number]- Ultrasound, [id number]- Traction (mechanical), [id number]- Ionotophoresis 4mg /ml Dexamethasone, Patient/Family education, Balance training, Stair training, Taping, Dry Needling, Joint mobilization, Joint manipulation, Spinal manipulation, Spinal mobilization, Scar mobilization, Vestibular training, Visual/preceptual remediation/compensation, DME instructions, Cryotherapy, and Moist heat.  All performed as medically necessary.  All included unless contraindicated  PLAN FOR NEXT SESSION: Review HEP knowledge/results.  Check lumbar mobility as able.    Bonna Bustard, PT, DPT, OCS, ATC 03/13/24  9:14 AM

## 2024-03-13 NOTE — Telephone Encounter (Signed)
 Called patient after 15 mins no show for appointment today.  Pt indicated not realizing he had an appointment today.  Was able to reschedule him for tomorrow at 1pm  Bonna Bustard, PT, DPT, OCS, ATC 03/13/24  12:07 PM

## 2024-03-14 ENCOUNTER — Encounter: Payer: Self-pay | Admitting: Rehabilitative and Restorative Service Providers"

## 2024-03-14 ENCOUNTER — Ambulatory Visit (INDEPENDENT_AMBULATORY_CARE_PROVIDER_SITE_OTHER): Admitting: Rehabilitative and Restorative Service Providers"

## 2024-03-14 DIAGNOSIS — M5459 Other low back pain: Secondary | ICD-10-CM | POA: Diagnosis not present

## 2024-03-14 NOTE — Therapy (Signed)
 OUTPATIENT PHYSICAL THERAPY TREATMENT   Patient Name: Jacob Marsh MRN: [id number] DOB:03-23-1951, 73 y.o., male Today's Date: 03/14/2024  END OF SESSION:  PT End of Session - 03/14/24 1302     Visit Number 2    Number of Visits 20    Date for PT Re-Evaluation 05/09/24    Authorization Type Medicare/ [employer]    Progress Note Due on Visit 10    PT Start Time 1258    PT Stop Time 1336    PT Time Calculation (min) 38 min    Activity Tolerance Patient tolerated treatment well    Behavior During Therapy [facility] for tasks assessed/performed              Past Medical History:  Diagnosis Date   A-fib (HCC) 10/17/2019   BPH (benign prostatic hyperplasia) 09/15/2016   Mild symptoms     Depression    Diverticulosis    History of diverticulosis// flex sig 2006//colonoscopy 01/21/2000 diverticulosis   Dupuytren contracture 12/29/2017   GERD (gastroesophageal reflux disease)    Globus sensation 07/17/2014   Hx of echocardiogram    Echo (9/14): EF 55%, grade 1 diastolic dysfunction, trivial AI, trivial MR, MAC, PASP 29   Palpitations 08/10/2013   Postural dizziness 03/20/2014   PVC (premature ventricular contraction)    PVCs (premature ventricular contractions) 03/20/2014   Trigger finger of both hands    Past Surgical History:  Procedure Laterality Date   A-FLUTTER ABLATION N/A 11/13/2019   Procedure: A-FLUTTER ABLATION;  Surgeon: Tammie Fall, MD;  Location: [facility];  Service: Cardiovascular;  Laterality: N/A;   DUPUYTREN CONTRACTURE RELEASE     HAND SURGERY Right 08/2020   TONSILLECTOMY     WISDOM TOOTH EXTRACTION     Patient Active Problem List   Diagnosis Date Noted   Hematoma 02/28/2024   Skin tear of elbow without complication 02/28/2024   Chest wall injury 02/28/2024   Low back pain 12/21/2023   Fatigue 12/21/2023   Dizziness 12/06/2023   Family history of colon cancer 06/19/2022   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   Vitamin D  deficiency 02/18/2021    Elevated TSH 02/18/2021   A-fib (HCC) 10/17/2019   Dupuytren contracture 12/29/2017   Trigger finger of both hands    Hx of echocardiogram    Diverticulosis    BPH (benign prostatic hyperplasia) 09/15/2016   Need for hepatitis C screening test 09/07/2016   Encounter for Medicare annual wellness exam 12/11/2014   Screening for lipoid disorders 12/03/2014   PVCs (premature ventricular contractions) 03/20/2014   Postural dizziness 03/20/2014   Palpitations 08/10/2013   History of chest pain 08/17/2012   Routine general medical examination at a health care facility 08/09/2012   Prostate cancer screening 08/09/2012   Thrombocytopenia (HCC) 07/18/2009    PCP: Clemens Curt, MD  REFERRING PROVIDER: Clemens Curt, MD  REFERRING DIAG: M54.50,G89.29 (ICD-10-CM) - Chronic midline low back pain without sciatica  Rationale for Evaluation and Treatment: Rehabilitation  THERAPY DIAG:  Other low back pain  ONSET DATE: 2024  SUBJECTIVE:  SUBJECTIVE STATEMENT: Pt indicated having improvement since last visit with back and also in rib complaints.  Had some questions about HEP.   PERTINENT HISTORY:  Depression, GERD. Pt reported recent fall while playing pickleball that resulted in two fracture ribs 9/10 on Lt side.   PVC ablation on Thursday.   PAIN:  NPRS scale: no pain upon arrival, reported no pain in last few days.  Pain location: back lumbar bilaterally  Pain description: tightness, sharp occasionally , mainly discomfort.  Aggravating factors: lifting/carrying items Relieving factors: stretching, resting from lifting  PRECAUTIONS: None  WEIGHT BEARING RESTRICTIONS: No  FALLS:  Has patient fallen in last 6 months? Yes 1 with ribs injury.   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   PLOF: Independent, walking routine, pickle ball , stretching routine.  Has dog to walk/hike with.   Has flexible dumbbells weights  PATIENT GOALS: Reduce pain, get upper body strength back  OBJECTIVE:   PATIENT SURVEYS:  Patient-Specific Activity Scoring Scheme  "0" represents "unable to perform." "10" represents "able to perform at prior level. 0 1 2 3 4 5 6 7 8 9  10 (Date and Score)   Activity Eval  02/29/2024 03/14/2024   1. Lifting objects  6 8   2. Bending  6   9  3.     4.    5.    Score 6 avg    Total score = sum of the activity scores/number of activities Minimum detectable change (90%CI) for average score = 2 points Minimum detectable change (90%CI) for single activity score = 3 points  SCREENING FOR RED FLAGS: Bowel or bladder incontinence: No Cauda equina syndrome: No  COGNITION: 02/29/2024 Overall cognitive status: WFL normal      SENSATION: 02/29/2024 Unremarkable  MUSCLE LENGTH: 02/29/2024 Lt and Rt hamstring passive SLR to 80 deg without back complaints.   POSTURE:  02/29/2024 Reduced lumbar lordosis in standing noted.  Equal iliac crest noted.   PALPATION: 02/29/2024 No specific tenderness to touch in lumbar.   LUMBAR ROM:  02/29/2024 Directional Preference Assessment: Centralization: not observed Peripheralization:  not observed  AROM Eval 02/29/2024 03/14/2024  Flexion To ankles with reduced tightness To floor without complaints.   Extension 50% WFL with some tightness 100 % WFL   Right lateral flexion To middle thigh with tightness noted Knee joint without complaints.   Left lateral flexion To middle thigh with tightness noted Knee joint without complaints  Right rotation    Left rotation     (Blank rows = not tested)  LOWER EXTREMITY ROM:      Right Eval 02/29/2024 Left Eval 02/29/2024  Hip flexion    Hip extension    Hip abduction    Hip  adduction    Hip internal rotation    Hip external rotation    Knee flexion    Knee extension    Ankle dorsiflexion    Ankle plantarflexion    Ankle inversion    Ankle eversion     (Blank rows = not tested)  LOWER EXTREMITY MMT:    MMT Right Eval 02/29/2024 Left Eval 02/29/2024  Hip flexion    Hip extension    Hip abduction    Hip adduction    Hip internal rotation    Hip external rotation    Knee flexion    Knee extension    Ankle dorsiflexion    Ankle plantarflexion    Ankle  inversion    Ankle eversion     (Blank rows = not tested)  LUMBAR SPECIAL TESTS:  02/29/2024 No specific testing today.   Held passive accessory mobility tesitng due to rib complaints.   FUNCTIONAL TESTS:  02/29/2024 No specific testing today  GAIT: 02/29/2024 Independent ambulation                                                                                                                                                                                                                   TODAY'S TREATMENT:                                                                                                         DATE: 03/14/2024  Therex: Lumbar extension AROM x 5 Lumbar rotation stretch 15 sec x 3 bilateral  Supine bridge 2-3 sec hold 2 x 10 Supine figure 4 push away 30 sec x 3 bilateral Supine figure 4 pull towards 30 sec x 3 bilateral   Neuro Re-ed (postural activation/strengthening, muscle activation) Tband rows c scapular retraction bilateral 2 x 15 blue band Tband GH ext bilateral  2 x 15 blue band  Seated multifidi isometric activation with UE lift into table 5 sec hold x 10 Seated ab isometric activation with UE press into table 5 sec hold x 10   Time spent in review of HEP.  Progression of program.     TODAY'S TREATMENT:                                                                                                         DATE: 02/29/2024  Therex:  HEP instruction/performance c cues  for techniques, handout provided.  Trial set performed of each for comprehension and symptom assessment.  See below for exercise list  PATIENT EDUCATION:  03/14/2024 Education details: HEP update Person educated: Patient Education method: Explanation, Demonstration, Verbal cues, and Handouts Education comprehension: verbalized understanding, returned demonstration, and verbal cues required  HOME EXERCISE PROGRAM: Access Code: [email or web address] URL: [email or web address] Date: 03/14/2024 Prepared by: Bonna Bustard  Exercises - Standing Lumbar Extension with Counter  - 3-5 x daily - 7 x weekly - 1 sets - 5 reps - Supine Lower Trunk Rotation  - 2 x daily - 7 x weekly - 1 sets - 3-5 reps - 15 hold - Supine Figure 4 Piriformis Stretch  - 2 x daily - 7 x weekly - 1 sets - 5 reps - 30 hold - Supine Piriformis Stretch with Foot on Ground  - 2 x daily - 7 x weekly - 1 sets - 5 reps - 30 hold - Supine Bridge  - 1-2 x daily - 7 x weekly - 1-2 sets - 10 reps - 2 hold - Standing Bilateral Low Shoulder Row with Anchored Resistance  - 1-2 x daily - 7 x weekly - 2-3 sets - 10-15 reps - Shoulder Extension with Resistance  - 1-2 x daily - 7 x weekly - 1-2 sets - 10-15 reps - Seated Abdominal Press into [employer]  - 1-2 x daily - 7 x weekly - 1 sets - 10 reps - 5-10 hold - Seated Multifidi Isometric  - 1-2 x daily - 7 x weekly - 1 sets - 10 reps - 5-10 hold  ASSESSMENT:  CLINICAL IMPRESSION: Positive improvements noted in back symptoms and rib symptoms since eval.  Reviewed HEP for improved knowledge and techniques.  At this time, range of movement in lumbar was improved as noted.    OBJECTIVE IMPAIRMENTS: Abnormal gait, decreased activity tolerance, decreased endurance, decreased mobility, decreased ROM, increased fascial restrictions, impaired perceived functional ability, increased muscle spasms, impaired flexibility, improper body mechanics, postural dysfunction, and pain.   ACTIVITY  LIMITATIONS: carrying, lifting, and bending  PARTICIPATION LIMITATIONS: interpersonal relationship and community activity  PERSONAL FACTORS: Concurrent rib injury are also affecting patient's functional outcome.   REHAB POTENTIAL: Good  CLINICAL DECISION MAKING: Stable/uncomplicated  EVALUATION COMPLEXITY: Low   GOALS: Goals reviewed with patient? Yes  SHORT TERM GOALS: (target date for Short term goals are 3 weeks 03/21/2024)  1. Patient will demonstrate independent use of home exercise program to maintain progress from in clinic treatments.  Goal status: on going 03/14/2024  LONG TERM GOALS: (target dates for all long term goals are 10 weeks  05/09/2024 )   1. Patient will demonstrate/report pain at worst less than or equal to 2/10 to facilitate minimal limitation in daily activity secondary to pain symptoms.  Goal status: New   2. Patient will demonstrate independent use of home exercise program to facilitate ability to maintain/progress functional gains from skilled physical therapy services.  Goal status: New   3. Patient will demonstrate Patient specific functional scale avg > or = 8/10 to indicate reduced disability due to condition.   Goal status: New   4. Patient will demonstrate lumbar extension 100 % WFL s symptoms to facilitate upright standing, walking posture at PLOF s limitation.  Goal status: New   5.  Patient will demonstrate/report ability to perform usual house and workout activity at PLOF s limitation.   Goal status: New   PLAN:  PT FREQUENCY: 1-2x/week  PT DURATION: 10 weeks  PLANNED INTERVENTIONS: Can include 75643- PT Re-evaluation, 97110-Therapeutic exercises, 97530- Therapeutic activity, 97112- Neuromuscular re-education, [phone]- Self Care, 97140- Manual therapy, [phone]- Gait training, [phone]- Orthotic Fit/training, [phone]- Canalith repositioning, [id number]- Aquatic Therapy, [phone]- Electrical stimulation (unattended), 97750 Physical performance testing,  [id number]- Electrical stimulation (manual), 97016- Vasopneumatic device, [id number]- Ultrasound, [id number]- Traction (mechanical), [id number]- Ionotophoresis 4mg /ml Dexamethasone, Patient/Family education, Balance training, Stair training, Taping, Dry Needling, Joint mobilization, Joint manipulation, Spinal manipulation, Spinal mobilization, Scar mobilization, Vestibular training, Visual/preceptual remediation/compensation, DME instructions, Cryotherapy, and Moist heat.  All performed as medically necessary.  All included unless contraindicated  PLAN FOR NEXT SESSION:   Possible HEP transitioning pending return presentation.   Bonna Bustard, PT, DPT, OCS, ATC 03/14/24  1:39 PM

## 2024-03-16 ENCOUNTER — Other Ambulatory Visit: Payer: Self-pay

## 2024-03-16 DIAGNOSIS — I493 Ventricular premature depolarization: Secondary | ICD-10-CM

## 2024-03-16 DIAGNOSIS — I4819 Other persistent atrial fibrillation: Secondary | ICD-10-CM

## 2024-03-20 NOTE — Progress Notes (Signed)
 Chief Complaint: Primary GI MD: Dr. Sandrea Cruel  HPI:  *** is a  ***  who was referred to me by Tower, Manley Seeds, MD for a complaint of *** .     Discussed the use of AI scribe software for clinical note transcription with the patient, who gave verbal consent to proceed.  History of Present Illness      PREVIOUS GI WORKUP     Past Medical History:  Diagnosis Date   A-fib (HCC) 10/17/2019   BPH (benign prostatic hyperplasia) 09/15/2016   Mild symptoms     Depression    Diverticulosis    History of diverticulosis// flex sig 2006//colonoscopy 01/21/2000 diverticulosis   Dupuytren contracture 12/29/2017   GERD (gastroesophageal reflux disease)    Globus sensation 07/17/2014   Hx of echocardiogram    Echo (9/14): EF 55%, grade 1 diastolic dysfunction, trivial AI, trivial MR, MAC, PASP 29   Palpitations 08/10/2013   Postural dizziness 03/20/2014   PVC (premature ventricular contraction)    PVCs (premature ventricular contractions) 03/20/2014   Trigger finger of both hands     Past Surgical History:  Procedure Laterality Date   A-FLUTTER ABLATION N/A 11/13/2019   Procedure: A-FLUTTER ABLATION;  Surgeon: Tammie Fall, MD;  Location: [facility];  Service: Cardiovascular;  Laterality: N/A;   DUPUYTREN CONTRACTURE RELEASE     HAND SURGERY Right 08/2020   TONSILLECTOMY     WISDOM TOOTH EXTRACTION      Current Outpatient Medications  Medication Sig Dispense Refill   acetaminophen  (TYLENOL ) 500 MG tablet Take 1,000 mg by mouth every 6 (six) hours as needed for moderate pain or headache.     b complex vitamins capsule Take 1 capsule by mouth in the morning and at bedtime.     Charcoal Activated 280 MG CAPS Take 560 mg by mouth daily.     Cholecalciferol (VITAMIN D -3 PO) Take 3 drops by mouth daily. 2 drops =1000 units (25 mcg)     flecainide  (TAMBOCOR ) 50 MG tablet Take 1 tablet (50 mg total) by mouth 2 (two) times daily. 180 tablet 3   MAGNESIUM  MALATE PO Take 100 mg by  mouth daily.     MELATONIN PO Take 1.5 capsules by mouth at bedtime.     metoprolol  tartrate (LOPRESSOR ) 25 MG tablet TAKE 1/2 TABLET BY MOUTH EVERY NIGHT AT BEDTIME. MAY TAKE AN ADDITIONAL 1/2 TABLET AS NEEDED FOR PALPITATION 90 tablet 3   Multiple Vitamin (MULTIVITAMIN WITH MINERALS) TABS Take 1 tablet by mouth daily.      Omega-3 Fatty Acids (OMEGA-3 FISH OIL PO) Take 720 mg by mouth in the morning and at bedtime.     OVER THE COUNTER MEDICATION Take 145 mg by mouth daily. NEUROMAG (Chelted Magnesium  L-Thronate 50mg )     SACCHAROMYCES BOULARDII PO Take 420 mg by mouth in the morning and at bedtime. Probiotic     Selenium 200 MCG CAPS Take 200 mcg by mouth daily.     Turmeric Curcumin 500 MG CAPS Take 1,000 mg by mouth 2 (two) times daily.     ZINC  PICOLINATE PO Take 25 mg by mouth in the morning and at bedtime.     No current facility-administered medications for this visit.    Allergies as of 03/21/2024   (No Known Allergies)    Family History  Problem Relation Age of Onset   Arthritis Mother    Heart disease Mother        ? atrial fib  Colon polyps Father    Depression Sister    Heart disease Maternal Grandmother        CAD   Cancer Maternal Grandmother        liver cancer   Heart disease Maternal Grandfather        CAD   Colon cancer Paternal Grandmother    Cancer - Colon Paternal Grandmother    Cancer Maternal Aunt        ?colon cancer   Colon cancer Paternal Uncle    Sudden death Neg Hx     Social History   Socioeconomic History   Marital status: Widowed    Spouse name: Not on file   Number of children: 3   Years of education: Not on file   Highest education level: Not on file  Occupational History   Occupation: works from home  Tobacco Use   Smoking status: Never   Smokeless tobacco: Never  Vaping Use   Vaping status: Never Used  Substance and Sexual Activity   Alcohol use: Yes    Alcohol/week: 1.0 standard drink of alcohol    Types: 1 Glasses of  wine per week    Comment: occ   Drug use: No   Sexual activity: Yes  Other Topics Concern   Not on file  Social History Narrative   ** Merged History Encounter **       Wife has alzheimer's and is living in a SNF since 2020 Still working as well  Children live in [place], [place], [place], [place], and [place], but they visit each other several times per year He's close with his sister who lives nearby   Social Drivers of [employer] Strain: Low Risk  (01/16/2022)   Overall Financial Resource Strain (CARDIA)    Difficulty of Paying Living Expenses: Not hard at all  Food Insecurity: No Food Insecurity (12/06/2023)   Hunger Vital Sign    Worried About Running Out of Food in the Last Year: Never true    Ran Out of Food in the Last Year: Never true  Transportation Needs: No Transportation Needs (01/19/2024)   PRAPARE - Administrator, Civil Service (Medical): No    Lack of Transportation (Non-Medical): No  Physical Activity: Sufficiently Active (01/19/2024)   Exercise Vital Sign    Days of Exercise per Week: 4 days    Minutes of Exercise per Session: 60 min  Stress: No Stress Concern Present (01/19/2024)   [employer] of Occupational Health - Occupational Stress Questionnaire    Feeling of Stress : Not at all  Social Connections: Socially Isolated (01/19/2024)   Social Connection and Isolation Panel [NHANES]    Frequency of Communication with Friends and Family: More than three times a week    Frequency of Social Gatherings with Friends and Family: Once a week    Attends Religious Services: Never    Database administrator or Organizations: No    Attends Banker Meetings: Never    Marital Status: Widowed  Intimate Partner Violence: Not At Risk (01/19/2024)   Humiliation, Afraid, Rape, and Kick questionnaire    Fear of Current or Ex-Partner: No    Emotionally Abused: No    Physically Abused: No    Sexually Abused: No    Review of Systems:     Constitutional: No weight loss, fever, chills, weakness or fatigue HEENT: Eyes: No change in vision  Ears, Nose, Throat:  No change in hearing or congestion Skin: No rash or itching Cardiovascular: No chest pain, chest pressure or palpitations   Respiratory: No SOB or cough Gastrointestinal: See HPI and otherwise negative Genitourinary: No dysuria or change in urinary frequency Neurological: No headache, dizziness or syncope Musculoskeletal: No new muscle or joint pain Hematologic: No bleeding or bruising Psychiatric: No history of depression or anxiety    Physical Exam:  Vital signs: There were no vitals taken for this visit.  Constitutional: NAD, alert and cooperative Head:  Normocephalic and atraumatic. Eyes:   PEERL, EOMI. No icterus. Conjunctiva pink. Respiratory: Respirations even and unlabored. Lungs clear to auscultation bilaterally.   No wheezes, crackles, or rhonchi.  Cardiovascular:  Regular rate and rhythm. No peripheral edema, cyanosis or pallor.  Gastrointestinal:  Soft, nondistended, nontender. No rebound or guarding. Normal bowel sounds. No appreciable masses or hepatomegaly. Rectal:  Declines Msk:  Symmetrical without gross deformities. Without edema, no deformity or joint abnormality.  Neurologic:  Alert and  oriented x4;  grossly normal neurologically.  Skin:   Dry and intact without significant lesions or rashes. Psychiatric: Oriented to person, place and time. Demonstrates good judgement and reason without abnormal affect or behaviors.  Physical Exam    RELEVANT LABS AND IMAGING: CBC    Component Value Date/Time   WBC 5.5 02/04/2024 1601   WBC 7.2 12/21/2023 1132   RBC 4.49 02/04/2024 1601   RBC 4.51 12/21/2023 1132   HGB 14.7 02/04/2024 1601   HCT 44.2 02/04/2024 1601   PLT 153 02/04/2024 1601   MCV 98 (H) 02/04/2024 1601   MCH 32.7 02/04/2024 1601   MCH 32.0 12/07/2023 0357   MCHC 33.3 02/04/2024 1601   MCHC 33.4 12/21/2023 1132    RDW 12.2 02/04/2024 1601   LYMPHSABS 1.4 12/21/2023 1132   LYMPHSABS 1.5 10/17/2019 1023   MONOABS 0.6 12/21/2023 1132   EOSABS 0.1 12/21/2023 1132   EOSABS 0.1 10/17/2019 1023   BASOSABS 0.0 12/21/2023 1132   BASOSABS 0.0 10/17/2019 1023    CMP     Component Value Date/Time   NA 138 02/04/2024 1600   K 4.2 02/04/2024 1600   CL 100 02/04/2024 1600   CO2 23 02/04/2024 1600   GLUCOSE 80 02/04/2024 1600   GLUCOSE 81 12/07/2023 0357   BUN 19 02/04/2024 1600   CREATININE 0.73 (L) 02/04/2024 1600   CALCIUM 9.5 02/04/2024 1600   PROT 6.3 05/24/2023 0754   ALBUMIN 4.3 05/24/2023 0754   AST 24 05/24/2023 0754   ALT 22 05/24/2023 0754   ALKPHOS 47 05/24/2023 0754   BILITOT 0.9 05/24/2023 0754   GFRNONAA >60 12/07/2023 0357   GFRAA 102 10/17/2019 1023     Assessment/Plan:   Family history of colon polyps Family history of colon cancer Personal history of colon polyps Father with colon polyps. Paternal grandmother and two of her siblings with colon cancer. Last colonoscopy in 2016 was normal but he does have a history of 5mm TA in 2001 so not a candidate for cologuard.  Afib s/p ablation 2021, frequent PVCs Echo 11/2023 with EF 50-55% Planning for ablation for PVCs 04/11/2024 with cardiology. Not on anticoagulation.  RBBB   Jacquette Canales [facility] 03/20/2024, 2:19 PM  Cc: Tower, Manley Seeds, MD

## 2024-03-21 ENCOUNTER — Encounter: Payer: Self-pay | Admitting: Gastroenterology

## 2024-03-21 ENCOUNTER — Ambulatory Visit (INDEPENDENT_AMBULATORY_CARE_PROVIDER_SITE_OTHER): Admitting: Gastroenterology

## 2024-03-21 VITALS — BP 120/62 | HR 60 | Ht 71.0 in | Wt 160.0 lb

## 2024-03-21 DIAGNOSIS — Z860100 Personal history of colon polyps, unspecified: Secondary | ICD-10-CM | POA: Diagnosis not present

## 2024-03-21 DIAGNOSIS — Z800 Family history of malignant neoplasm of digestive organs: Secondary | ICD-10-CM

## 2024-03-21 DIAGNOSIS — I493 Ventricular premature depolarization: Secondary | ICD-10-CM

## 2024-03-21 DIAGNOSIS — I4510 Unspecified right bundle-branch block: Secondary | ICD-10-CM

## 2024-03-21 DIAGNOSIS — Z83719 Family history of colon polyps, unspecified: Secondary | ICD-10-CM | POA: Diagnosis not present

## 2024-03-21 DIAGNOSIS — I4891 Unspecified atrial fibrillation: Secondary | ICD-10-CM | POA: Diagnosis not present

## 2024-03-21 NOTE — Patient Instructions (Signed)
 _______________________________________________________  If your blood pressure at your visit was 140/90 or greater, please contact your primary care physician to follow up on this.  _______________________________________________________  If you are age 75 or older, your body mass index should be between 23-30. Your Body mass index is 22.32 kg/m. If this is out of the aforementioned range listed, please consider follow up with your Primary Care Provider.  If you are age 12 or younger, your body mass index should be between 19-25. Your Body mass index is 22.32 kg/m. If this is out of the aformentioned range listed, please consider follow up with your Primary Care Provider.   ________________________________________________________  The [facility] providers would like to encourage you to use MYCHART to communicate with providers for non-urgent requests or questions.  Due to long hold times on the telephone, sending your provider a message by [facility] may be a faster and more efficient way to get a response.  Please allow 48 business hours for a response.  Please remember that this is for non-urgent requests.  _______________________________________________________  Follow up as needed. Please call with any questions or concerns.  It was a pleasure to see you today!  Thank you for trusting me with your gastrointestinal care!

## 2024-03-22 NOTE — Progress Notes (Signed)
 Agree with assessment and plan as outlined.   Neither his family history of colon cancer, nor his history of colon polyps remotely, warrants a colonoscopy sooner than 10 years from his last exam. If his father had large or advanced polyps, particularly at a young age, that could get him a colonoscopy sooner, however, unclear if he has that information. Regardless, if he is anxious about this, we can offer him a colonoscopy as his next exam is due in 2026. However, I agree that his a fibb ablation should be done first, prior to colonoscopy, and once he is recovered from that and cleared by cardiology, we can proceed in the next 6 months or so. Thanks.

## 2024-03-27 DIAGNOSIS — I493 Ventricular premature depolarization: Secondary | ICD-10-CM | POA: Diagnosis not present

## 2024-03-27 DIAGNOSIS — I4819 Other persistent atrial fibrillation: Secondary | ICD-10-CM | POA: Diagnosis not present

## 2024-03-28 ENCOUNTER — Encounter: Payer: Self-pay | Admitting: Rehabilitative and Restorative Service Providers"

## 2024-03-28 ENCOUNTER — Ambulatory Visit (INDEPENDENT_AMBULATORY_CARE_PROVIDER_SITE_OTHER): Admitting: Rehabilitative and Restorative Service Providers"

## 2024-03-28 DIAGNOSIS — M5459 Other low back pain: Secondary | ICD-10-CM

## 2024-03-28 LAB — CBC
Hematocrit: 46.7 % (ref 37.5–51.0)
Hemoglobin: 15.1 g/dL (ref 13.0–17.7)
MCH: 32.1 pg (ref 26.6–33.0)
MCHC: 32.3 g/dL (ref 31.5–35.7)
MCV: 99 fL — ABNORMAL HIGH (ref 79–97)
Platelets: 148 10*3/uL — ABNORMAL LOW (ref 150–450)
RBC: 4.71 x10E6/uL (ref 4.14–5.80)
RDW: 12 % (ref 11.6–15.4)
WBC: 6.3 10*3/uL (ref 3.4–10.8)

## 2024-03-28 LAB — BASIC METABOLIC PANEL WITH GFR
BUN/Creatinine Ratio: 23 (ref 10–24)
BUN: 20 mg/dL (ref 8–27)
CO2: 20 mmol/L (ref 20–29)
Calcium: 9.6 mg/dL (ref 8.6–10.2)
Chloride: 106 mmol/L (ref 96–106)
Creatinine, Ser: 0.87 mg/dL (ref 0.76–1.27)
Glucose: 88 mg/dL (ref 70–99)
Potassium: 4.3 mmol/L (ref 3.5–5.2)
Sodium: 142 mmol/L (ref 134–144)
eGFR: 91 mL/min/{1.73_m2} (ref >59)

## 2024-03-28 NOTE — Therapy (Signed)
 OUTPATIENT PHYSICAL THERAPY TREATMENT / DISCHARGE   Patient Name: Jacob Marsh MRN: [id number] DOB:09/07/1951, 73 y.o., male Today's Date: 03/28/2024   PHYSICAL THERAPY DISCHARGE SUMMARY  Visits from Start of Care: 3  Current functional level related to goals / functional outcomes: See note   Remaining deficits: See note   Education / Equipment: HEP  Patient goals were met. Patient is being discharged due to meeting the stated rehab goals.   END OF SESSION:  PT End of Session - 03/28/24 1142     Visit Number 3    Number of Visits 20    Date for PT Re-Evaluation 05/09/24    Authorization Type Medicare/ AARP    Progress Note Due on Visit 10    PT Start Time 1141    PT Stop Time 1206    PT Time Calculation (min) 25 min    Activity Tolerance Patient tolerated treatment well    Behavior During Therapy [facility] for tasks assessed/performed               Past Medical History:  Diagnosis Date   A-fib (HCC) 10/17/2019   BPH (benign prostatic hyperplasia) 09/15/2016   Mild symptoms     Broken ribs    Depression    Diverticulosis    History of diverticulosis// flex sig 2006//colonoscopy 01/21/2000 diverticulosis   Dupuytren contracture 12/29/2017   GERD (gastroesophageal reflux disease)    Globus sensation 07/17/2014   Hx of echocardiogram    Echo (9/14): EF 55%, grade 1 diastolic dysfunction, trivial AI, trivial MR, MAC, PASP 29   Palpitations 08/10/2013   Postural dizziness 03/20/2014   PVC (premature ventricular contraction)    PVCs (premature ventricular contractions) 03/20/2014   Trigger finger of both hands    Past Surgical History:  Procedure Laterality Date   A-FLUTTER ABLATION N/A 11/13/2019   Procedure: A-FLUTTER ABLATION;  Surgeon: Tammie Fall, MD;  Location: [facility];  Service: Cardiovascular;  Laterality: N/A;   DUPUYTREN CONTRACTURE RELEASE     HAND SURGERY Right 08/2020   TONSILLECTOMY     WISDOM TOOTH EXTRACTION     Patient  Active Problem List   Diagnosis Date Noted   Hematoma 02/28/2024   Skin tear of elbow without complication 02/28/2024   Chest wall injury 02/28/2024   Low back pain 12/21/2023   Fatigue 12/21/2023   Dizziness 12/06/2023   Family history of colon cancer 06/19/2022   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   Vitamin D  deficiency 02/18/2021   Elevated TSH 02/18/2021   A-fib (HCC) 10/17/2019   Dupuytren contracture 12/29/2017   Trigger finger of both hands    Hx of echocardiogram    Diverticulosis    BPH (benign prostatic hyperplasia) 09/15/2016   Need for hepatitis C screening test 09/07/2016   Encounter for Medicare annual wellness exam 12/11/2014   Screening for lipoid disorders 12/03/2014   PVCs (premature ventricular contractions) 03/20/2014   Postural dizziness 03/20/2014   Palpitations 08/10/2013   History of chest pain 08/17/2012   Routine general medical examination at a health care facility 08/09/2012   Prostate cancer screening 08/09/2012   Thrombocytopenia (HCC) 07/18/2009    PCP: Clemens Curt, MD  REFERRING PROVIDER: Clemens Curt, MD  REFERRING DIAG: M54.50,G89.29 (ICD-10-CM) - Chronic midline low back pain without sciatica  Rationale for Evaluation and Treatment: Rehabilitation  THERAPY DIAG:  Other low back pain  ONSET DATE: 2024  SUBJECTIVE:  SUBJECTIVE STATEMENT: Pt indicated having stiffness in morning but not having pain complaints in back.    PERTINENT HISTORY:  Depression, GERD. Pt reported recent fall while playing pickleball that resulted in two fracture ribs 9/10 on Lt side.   PVC ablation on Thursday.   PAIN:  NPRS scale: no pain upon arrival, reported no pain in last few days.  Pain location: back lumbar bilaterally  Pain description: tightness, sharp occasionally  , mainly discomfort.  Aggravating factors: lifting/carrying items Relieving factors: stretching, resting from lifting  PRECAUTIONS: None  WEIGHT BEARING RESTRICTIONS: No  FALLS:  Has patient fallen in last 6 months? Yes 1 with ribs injury.   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   PLOF: Independent, walking routine, pickle ball , stretching routine.  Has dog to walk/hike with.   Has flexible dumbbells weights  PATIENT GOALS: Reduce pain, get upper body strength back  OBJECTIVE:   PATIENT SURVEYS:  Patient-Specific Activity Scoring Scheme  "0" represents "unable to perform." "10" represents "able to perform at prior level. 0 1 2 3 4 5 6 7 8 9  10 (Date and Score)   Activity Eval  02/29/2024 03/14/2024  03/28/2024  1. Lifting objects  6 8  10   2. Bending  6   9 10   3.      4.     5.     Score 6 avg  10   Total score = sum of the activity scores/number of activities Minimum detectable change (90%CI) for average score = 2 points Minimum detectable change (90%CI) for single activity score = 3 points  SCREENING FOR RED FLAGS: Bowel or bladder incontinence: No Cauda equina syndrome: No  COGNITION: 02/29/2024 Overall cognitive status: WFL normal      SENSATION: 02/29/2024 Unremarkable  MUSCLE LENGTH: 02/29/2024 Lt and Rt hamstring passive SLR to 80 deg without back complaints.   POSTURE:  02/29/2024 Reduced lumbar lordosis in standing noted.  Equal iliac crest noted.   PALPATION: 02/29/2024 No specific tenderness to touch in lumbar.   LUMBAR ROM:  02/29/2024 Directional Preference Assessment: Centralization: not observed Peripheralization:  not observed  AROM Eval 02/29/2024 03/14/2024 03/28/2024  Flexion To ankles with reduced tightness To floor without complaints.    Extension 50% WFL with some tightness 100 % WFL  100%  Right lateral flexion To middle thigh with tightness noted Knee  joint without complaints.    Left lateral flexion To middle thigh with tightness noted Knee joint without complaints   Right rotation     Left rotation      (Blank rows = not tested)  LOWER EXTREMITY ROM:      Right Eval 02/29/2024 Left Eval 02/29/2024  Hip flexion    Hip extension    Hip abduction    Hip adduction    Hip internal rotation    Hip external rotation    Knee flexion    Knee extension    Ankle dorsiflexion    Ankle plantarflexion    Ankle inversion    Ankle eversion     (Blank rows = not tested)  LOWER EXTREMITY MMT:    MMT Right Eval 02/29/2024 Left Eval 02/29/2024  Hip flexion    Hip extension    Hip abduction    Hip adduction    Hip internal rotation    Hip external rotation    Knee flexion    Knee extension    Ankle dorsiflexion  Ankle plantarflexion    Ankle inversion    Ankle eversion     (Blank rows = not tested)  LUMBAR SPECIAL TESTS:  02/29/2024 No specific testing today.   Held passive accessory mobility tesitng due to rib complaints.   FUNCTIONAL TESTS:  02/29/2024 No specific testing today  GAIT: 02/29/2024 Independent ambulation                                                                                                                                                                                                                   TODAY'S TREATMENT:                                                                                                         DATE: 03/28/2024  Therex: Nustep lvl 6 10 mins UE/LE Blue band bilateral rows 2 x 15 Blue band gh ext 3 sec hold 2 x 10 Review of existing HEP and printout review.  Cues for routine use and tips for helping stiffness in morning.     TODAY'S TREATMENT:                                                                                                         DATE: 03/14/2024  Therex: Lumbar extension AROM x 5 Lumbar rotation stretch 15 sec x 3 bilateral  Supine bridge 2-3 sec hold 2 x  10 Supine figure 4 push away 30 sec x 3 bilateral Supine figure 4 pull towards 30 sec x 3 bilateral   Neuro Re-ed (postural activation/strengthening, muscle activation) Tband rows c scapular retraction bilateral 2 x 15 blue band Tband GH ext bilateral  2 x 15 blue band  Seated multifidi isometric activation with UE lift into table 5 sec hold x 10 Seated ab isometric activation with UE press into table 5 sec hold x 10   Time spent in review of HEP.  Progression of program.   TODAY'S TREATMENT:                                                                                                         DATE: 02/29/2024  Therex:    HEP instruction/performance c cues for techniques, handout provided.  Trial set performed of each for comprehension and symptom assessment.  See below for exercise list  PATIENT EDUCATION:  03/14/2024 Education details: HEP update Person educated: Patient Education method: Explanation, Demonstration, Verbal cues, and Handouts Education comprehension: verbalized understanding, returned demonstration, and verbal cues required  HOME EXERCISE PROGRAM: Access Code: [email or web address] URL: [email or web address] Date: 03/14/2024 Prepared by: Bonna Bustard  Exercises - Standing Lumbar Extension with Counter  - 3-5 x daily - 7 x weekly - 1 sets - 5 reps - Supine Lower Trunk Rotation  - 2 x daily - 7 x weekly - 1 sets - 3-5 reps - 15 hold - Supine Figure 4 Piriformis Stretch  - 2 x daily - 7 x weekly - 1 sets - 5 reps - 30 hold - Supine Piriformis Stretch with Foot on Ground  - 2 x daily - 7 x weekly - 1 sets - 5 reps - 30 hold - Supine Bridge  - 1-2 x daily - 7 x weekly - 1-2 sets - 10 reps - 2 hold - Standing Bilateral Low Shoulder Row with Anchored Resistance  - 1-2 x daily - 7 x weekly - 2-3 sets - 10-15 reps - Shoulder Extension with Resistance  - 1-2 x daily - 7 x weekly - 1-2 sets - 10-15 reps - Seated Abdominal Press into [employer]  - 1-2 x daily - 7 x weekly  - 1 sets - 10 reps - 5-10 hold - Seated Multifidi Isometric  - 1-2 x daily - 7 x weekly - 1 sets - 10 reps - 5-10 hold  ASSESSMENT:  CLINICAL IMPRESSION: Pt presented with minimal to no pain symptoms, good HEP knowledge and good progression on objective data.  Pt was appropriate for HEP transition/discharge at this time.    Decreased treatment time necessary today due to presentation/discharge.   OBJECTIVE IMPAIRMENTS: Abnormal gait, decreased activity tolerance, decreased endurance, decreased mobility, decreased ROM, increased fascial restrictions, impaired perceived functional ability, increased muscle spasms, impaired flexibility, improper body mechanics, postural dysfunction, and pain.   ACTIVITY LIMITATIONS: carrying, lifting, and bending  PARTICIPATION LIMITATIONS: interpersonal relationship and community activity  PERSONAL FACTORS: Concurrent rib injury are also affecting patient's functional outcome.   REHAB POTENTIAL: Good  CLINICAL DECISION MAKING: Stable/uncomplicated  EVALUATION COMPLEXITY: Low   GOALS: Goals reviewed with patient? Yes  SHORT TERM GOALS: (target date for Short term goals are 3 weeks 03/21/2024)  1. Patient will demonstrate independent use of home exercise program to maintain progress from in clinic treatments.  Goal status: Met  LONG TERM GOALS: (target dates for all long term goals are 10 weeks  05/09/2024 )   1. Patient will demonstrate/report pain at worst less than or equal to 2/10 to facilitate minimal limitation in daily activity secondary to pain symptoms.  Goal status: Met 03/28/2024   2. Patient will demonstrate independent use of home exercise program to facilitate ability to maintain/progress functional gains from skilled physical therapy services.  Goal status: Met 03/28/2024   3. Patient will demonstrate Patient specific functional scale avg > or = 8/10 to indicate reduced disability due to condition.   Goal status: Met 03/28/2024   4.  Patient will demonstrate lumbar extension 100 % WFL s symptoms to facilitate upright standing, walking posture at PLOF s limitation.  Goal status: Met 03/28/2024   5.  Patient will demonstrate/report ability to perform usual house and workout activity at PLOF s limitation.   Goal status: Met 03/28/2024   PLAN:  PT FREQUENCY: 1-2x/week  PT DURATION: 10 weeks  PLANNED INTERVENTIONS: Can include 78295- PT Re-evaluation, 97110-Therapeutic exercises, 97530- Therapeutic activity, 97112- Neuromuscular re-education, 97535- Self Care, 97140- Manual therapy, [phone]- Gait training, [phone]- Orthotic Fit/training, [phone]- Canalith repositioning, [id number]- Aquatic Therapy, [phone]- Electrical stimulation (unattended), 97750 Physical performance testing, [phone]- Electrical stimulation (manual), 97016- Vasopneumatic device, [id number]- Ultrasound, [id number]- Traction (mechanical), [id number]- Ionotophoresis 4mg /ml Dexamethasone, Patient/Family education, Balance training, Stair training, Taping, Dry Needling, Joint mobilization, Joint manipulation, Spinal manipulation, Spinal mobilization, Scar mobilization, Vestibular training, Visual/preceptual remediation/compensation, DME instructions, Cryotherapy, and Moist heat.  All performed as medically necessary.  All included unless contraindicated  PLAN FOR NEXT SESSION:  Discharge to HEP.   Bonna Bustard, PT, DPT, OCS, ATC 03/28/24  12:09 PM

## 2024-03-31 ENCOUNTER — Telehealth (HOSPITAL_COMMUNITY): Payer: Self-pay

## 2024-03-31 NOTE — Telephone Encounter (Signed)
 Attempted to reach patient to discuss upcoming procedure, no answer. Left VM for patient to return call.

## 2024-04-02 ENCOUNTER — Ambulatory Visit: Payer: Self-pay | Admitting: Cardiology

## 2024-04-04 NOTE — Telephone Encounter (Signed)
 Patient returned call to discuss upcoming procedure.   Labs: completed.   Any recent signs of acute illness or been started on antibiotics? No. Patient reports he feels great and is 99% recovered from his previous rib fracture. Any new medications started? No Any medications to hold?   Hold Flecainide  and Metoprolol  for 3 days- last dose on June 12.   Medication instructions:  On the morning of your procedure DO NOT take any medication.or the procedure may be rescheduled. Nothing to eat or drink after midnight prior to your procedure.  Confirmed patient is scheduled for PVC Ablation on Monday, June 16 with Dr. Clinton Danas. Instructed patient to arrive at the Main Entrance A at [facility]: [address] [place], [place] [address] and check in at Admitting at 10:30 AM.  Advised of plan to go home the same day and will only stay overnight if medically necessary. You MUST have a responsible adult to drive you home and MUST be with you the first 24 hours after you arrive home or your procedure could be cancelled.  Patient verbalized understanding to all instructions provided and agreed to proceed with procedure.

## 2024-04-07 NOTE — Pre-Procedure Instructions (Signed)
 Instructed patient on the following items: Arrival time 1200- new arival time Nothing to eat or drink after midnight No meds AM of procedure Responsible person to drive you home and stay with you for 24 hrs

## 2024-04-14 ENCOUNTER — Encounter: Payer: Self-pay | Admitting: Cardiology

## 2024-05-19 ENCOUNTER — Ambulatory Visit: Admitting: Cardiology

## 2024-06-28 ENCOUNTER — Telehealth: Payer: Self-pay | Admitting: Cardiology

## 2024-06-28 NOTE — Telephone Encounter (Signed)
 Routed to EP scheduler

## 2024-06-28 NOTE — Telephone Encounter (Signed)
 Pt would like a c/b regarding upcoming Ablation. Please advise

## 2024-06-29 NOTE — Telephone Encounter (Signed)
 Spoke with pt - his Ablation is scheduled with Dr. Kennyth for Monday, 9/8 and he hasn't received his updated instruction letter.  I sent an updated letter to him.  He will need updated labs done and will get them done the day of procedure.

## 2024-06-30 NOTE — Pre-Procedure Instructions (Signed)
Attempted to call patient regarding procedure instructions.  Left voicemail on the following items: Arrival time 1030 Nothing to eat or drink after midnight No meds AM of procedure Responsible person to drive you home and stay with you for 24 hrs

## 2024-07-02 ENCOUNTER — Encounter (HOSPITAL_COMMUNITY): Payer: Self-pay | Admitting: Cardiology

## 2024-07-02 NOTE — Anesthesia Preprocedure Evaluation (Signed)
 Anesthesia Evaluation  Patient identified by MRN, date of birth, ID band Patient awake    Reviewed: Allergy & Precautions, NPO status , Patient's Chart, lab work & pertinent test results, reviewed documented beta blocker date and time   Airway Mallampati: I  TM Distance: >3 FB Neck ROM: Full    Dental no notable dental hx. 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  Hx/o fPVC ablation 2020   Neuro/Psych  PSYCHIATRIC DISORDERS  Depression    negative neurological ROS     GI/Hepatic Neg liver ROS,GERD  Medicated,,  Endo/Other  negative endocrine ROS    Renal/GU negative Renal ROS   BPH    Musculoskeletal negative musculoskeletal ROS (+)    Abdominal   Peds  Hematology negative hematology ROS (+) Hx/o Thrombocytopenia   Anesthesia Other Findings   Reproductive/Obstetrics                              Anesthesia Physical Anesthesia Plan  ASA: 2  Anesthesia Plan: MAC   Post-op Pain Management: Minimal or no pain anticipated   Induction: Intravenous  PONV Risk Score and Plan: 3 and Treatment may vary due to age or medical condition, Ondansetron  and Dexamethasone  Airway Management Planned: Natural Airway, Nasal Cannula and Simple Face Mask  Additional Equipment: None  Intra-op Plan:   Post-operative Plan: Extubation in OR  Informed Consent: I have reviewed the patients History and Physical, chart, labs and discussed the procedure including the risks, benefits and alternatives for the proposed anesthesia with the patient or authorized representative who has indicated his/her understanding and acceptance.     Dental advisory given  Plan  Discussed with: CRNA and Anesthesiologist  Anesthesia Plan Comments: (Start with MAC then possible GETA if needed)         Anesthesia Quick Evaluation

## 2024-07-03 ENCOUNTER — Other Ambulatory Visit: Payer: Self-pay

## 2024-07-03 ENCOUNTER — Ambulatory Visit (HOSPITAL_BASED_OUTPATIENT_CLINIC_OR_DEPARTMENT_OTHER): Payer: Self-pay

## 2024-07-03 ENCOUNTER — Ambulatory Visit (HOSPITAL_COMMUNITY): Payer: Self-pay

## 2024-07-03 ENCOUNTER — Ambulatory Visit (HOSPITAL_COMMUNITY)
Admission: RE | Admit: 2024-07-03 | Discharge: 2024-07-03 | Disposition: A | Discharge status: Home / Self Care | Attending: Cardiology | Admitting: Cardiology

## 2024-07-03 ENCOUNTER — Ambulatory Visit (HOSPITAL_COMMUNITY)
Admission: RE | Disposition: A | Discharge status: Home / Self Care | Payer: Self-pay | Source: Home / Self Care | Attending: Cardiology

## 2024-07-03 DIAGNOSIS — I493 Ventricular premature depolarization: Secondary | ICD-10-CM | POA: Insufficient documentation

## 2024-07-03 DIAGNOSIS — F32A Depression, unspecified: Secondary | ICD-10-CM

## 2024-07-03 DIAGNOSIS — K219 Gastro-esophageal reflux disease without esophagitis: Secondary | ICD-10-CM | POA: Diagnosis not present

## 2024-07-03 DIAGNOSIS — I4891 Unspecified atrial fibrillation: Secondary | ICD-10-CM

## 2024-07-03 DIAGNOSIS — I4819 Other persistent atrial fibrillation: Secondary | ICD-10-CM | POA: Insufficient documentation

## 2024-07-03 DIAGNOSIS — Z79899 Other long term (current) drug therapy: Secondary | ICD-10-CM | POA: Insufficient documentation

## 2024-07-03 DIAGNOSIS — Z7901 Long term (current) use of anticoagulants: Secondary | ICD-10-CM | POA: Diagnosis not present

## 2024-07-03 HISTORY — PX: PVC ABLATION: EP1236

## 2024-07-03 LAB — BASIC METABOLIC PANEL WITH GFR
Anion gap: 12 (ref 5–15)
BUN: 17 mg/dL (ref 8–23)
CO2: 23 mmol/L (ref 22–32)
Calcium: 9.6 mg/dL (ref 8.9–10.3)
Chloride: 107 mmol/L (ref 98–111)
Creatinine, Ser: 1.03 mg/dL (ref 0.61–1.24)
GFR, Estimated: >60 mL/min (ref >60)
Glucose, Bld: 89 mg/dL (ref 70–99)
Potassium: 4.1 mmol/L (ref 3.5–5.1)
Sodium: 142 mmol/L (ref 135–145)

## 2024-07-03 LAB — CBC
HCT: 47.9 % (ref 39.0–52.0)
Hemoglobin: 16 g/dL (ref 13.0–17.0)
MCH: 32.1 pg (ref 26.0–34.0)
MCHC: 33.4 g/dL (ref 30.0–36.0)
MCV: 96 fL (ref 80.0–100.0)
Platelets: 131 K/uL — ABNORMAL LOW (ref 150–400)
RBC: 4.99 MIL/uL (ref 4.22–5.81)
RDW: 13.5 % (ref 11.5–15.5)
WBC: 6.4 K/uL (ref 4.0–10.5)
nRBC: 0 % (ref 0.0–0.2)

## 2024-07-03 SURGERY — PVC ABLATION
Anesthesia: Monitor Anesthesia Care

## 2024-07-03 MED ORDER — HEPARIN SODIUM (PORCINE) 1000 UNIT/ML IJ SOLN
INTRAMUSCULAR | Status: AC
  Filled 2024-07-03: qty 10

## 2024-07-03 MED ORDER — SODIUM CHLORIDE 0.9 % IV SOLN: INTRAVENOUS | Status: DC

## 2024-07-03 MED ORDER — FENTANYL CITRATE (PF) 100 MCG/2ML IJ SOLN
INTRAMUSCULAR | Status: AC
  Filled 2024-07-03: qty 2

## 2024-07-03 MED ORDER — MIDAZOLAM HCL 2 MG/2ML IJ SOLN
INTRAMUSCULAR | Status: AC
  Filled 2024-07-03: qty 2

## 2024-07-03 MED ORDER — ISOPROTERENOL HCL 0.2 MG/ML IJ SOLN
INTRAMUSCULAR | Status: AC
  Filled 2024-07-03: qty 5

## 2024-07-03 MED ORDER — ISOPROTERENOL HCL 0.2 MG/ML IJ SOLN
INTRAVENOUS | Status: DC | PRN
  Administered 2024-07-03: 2 ug/min via INTRAVENOUS

## 2024-07-03 MED ORDER — METOPROLOL TARTRATE 25 MG PO TABS: 12.5000 mg | ORAL_TABLET | Freq: Two times a day (BID) | ORAL | 3 refills | Status: DC

## 2024-07-03 MED ORDER — APIXABAN 5 MG PO TABS: 5.0000 mg | ORAL_TABLET | Freq: Two times a day (BID) | ORAL | 2 refills | Status: DC

## 2024-07-03 SURGICAL SUPPLY — 3 items
PACK EP LF (CUSTOM PROCEDURE TRAY) ×1 IMPLANT
PAD DEFIB RADIO PHYSIO CONN (PAD) ×1 IMPLANT
PATCH CARTO3 (PAD) IMPLANT

## 2024-07-03 NOTE — Anesthesia Postprocedure Evaluation (Signed)
 Anesthesia Post Note  Patient: Jacob Marsh  Procedure(s) Performed: PVC ABLATION     Patient location during evaluation: PACU Anesthesia Type: MAC Level of consciousness: awake and alert and oriented Pain management: pain level controlled Vital Signs Assessment: post-procedure vital signs reviewed and stable Respiratory status: spontaneous breathing, nonlabored ventilation and respiratory function stable Cardiovascular status: stable and blood pressure returned to baseline Postop Assessment: no apparent nausea or vomiting Anesthetic complications: no   No notable events documented.  Last Vitals:  Vitals:   07/03/24 1058 07/03/24 1149  BP: (!) 127/92   Pulse: 99   Resp: 20   Temp: 36.6 C   SpO2: 100% 100%    Last Pain:  Vitals:   07/03/24 1220  PainSc: 0-No pain   Pain Goal: Patients Stated Pain Goal: 4 (07/03/24 1127)                 [name] A.

## 2024-07-03 NOTE — Transfer of Care (Signed)
 Immediate Anesthesia Transfer of Care Note  Patient: Jacob Marsh  Procedure(s) Performed: PVC ABLATION  Patient Location: Short Stay  Anesthesia Type: No anesthesia administered -- procedure cancelled prior to induction -- originally planned for MAC  Level of Consciousness: awake, alert , oriented, and patient cooperative  Airway & Oxygen Therapy: Patient Spontanous Breathing  Post-op Assessment: Report given to RN and Post -op Vital signs reviewed and stable  Post vital signs: Reviewed and stable  Last Vitals:  Vitals Value Taken Time  BP    Temp    Pulse    Resp    SpO2      Last Pain:  Vitals:   07/03/24 1127  PainSc: 0-No pain      Patients Stated Pain Goal: 4 (07/03/24 1127)  Complications: No notable events documented.

## 2024-07-03 NOTE — Anesthesia Procedure Notes (Signed)
 Procedure Name: MAC Date/Time: 07/03/2024 11:45 AM  Performed by: Marva Lonni PARAS, CRNAPre-anesthesia Checklist: Patient identified, Emergency Drugs available, Suction available and Patient being monitored Patient Re-evaluated:Patient Re-evaluated prior to induction Oxygen Delivery Method: Nasal cannula Dental Injury: Teeth and Oropharynx as per pre-operative assessment

## 2024-07-03 NOTE — H&P (View-Only) (Signed)
 Electrophysiology Note:   Date:  07/03/24  ID:  Jacob Marsh, DOB [birth year], MRN [id number]   Primary Cardiologist: Danelle Birmingham, MD Electrophysiologist: Danelle Birmingham, MD       History of Present Illness:     CC: Jacob Marsh is a 73 y.o. male with h/o atrial flutter s/p ablation 2021, persistent atrial fibrillation, PVCs who is being seen today for EP follow up.   Patient presented to ED on 2/10 with a chief complaint of dizziness. He was found to be in ventricular bigeminy at the time. He also felt like he may have been having increased burden of AF leading up to hospital visit. Patient did not want to make any changes to regimen at that time. He was given a cardiac monitor to assess burden of AF and PVCs with plans to follow up in clinic.   Discussed the use of AI scribe software for clinical note transcription with the patient, who gave verbal consent to proceed.   History of Present Illness   The patient, with a known history of atrial fibrillation and premature ventricular contractions (PVCs), presents with progressively worsening symptoms over the past few months, which corresponds with an increase in PVC burden. The patient describes feeling lightheaded, experiencing palpitations, and having chest pressure. These symptoms have been severe enough to cause concern and affect his daily activities, including his ability to walk his dog for three miles daily. The patient reports that the symptoms come and go. However, recently, the symptoms have been more noticeable and bothersome. The patient has been wearing a heart monitor for about three weeks to help determine the cause of his symptoms. The patient is currently on flecainide  for his heart conditions, but the medication does not seem to be significantly improving his symptoms. No acute complaints today.     Interval: Presents today for scheduled ablation. Reports feeling relatively well. Continues to have palpitations. No new or  acute complaints.   Review of systems complete and found to be negative unless listed in HPI.    EP Information / Studies Personally Reviewed:      EKG Interpretation Date/Time:                  Tuesday January 04 2024 10:15:51 EDT Ventricular Rate:         66 PR Interval:                 220 QRS Duration:             114 QT Interval:                 460 QTC Calculation:482 R Axis:                         -68   Text Interpretation:Sinus bradycardia with 1st degree A-V block with frequent Premature ventricular complexes Incomplete right bundle branch block Left anterior fascicular block When compared with ECG of 06-Dec-2023 15:07, No significant change was found Confirmed by Kennyth Chew [phone]) on 01/04/2024 10:23:42 AM      EKG 12/06/23: Sinus with PVCs.      Echo 12/07/23:   1. Very difficult study with poor windows for TTE. LVEF appears low  normal 50-55%.   2. Left ventricular ejection fraction, by estimation, is 50 to 55%. The  left ventricle has low normal function. The left ventricle demonstrates  global hypokinesis. Left ventricular diastolic function could not be  evaluated.  3. Right ventricular systolic function is normal. The right ventricular  size is normal. Tricuspid regurgitation signal is inadequate for assessing  PA pressure.   4. The mitral valve is grossly normal. Mild mitral valve regurgitation.  No evidence of mitral stenosis.   5. The aortic valve is grossly normal. Aortic valve regurgitation is not  visualized. No aortic stenosis is present.   6. The inferior vena cava is dilated in size with >50% respiratory  variability, suggesting right atrial pressure of 8 mmHg.    Risk Assessment/Calculations:     CHA2DS2-VASc Score = 1   This indicates a 0.6% annual risk of stroke. [name] score is based upon: CHF History: 0 HTN History: 0 Diabetes History: 0 Stroke History: 0 Vascular Disease History: 0 Age Score: 1 Gender Score: 0            Physical Exam:    Today's Vitals   07/03/24 1058 07/03/24 1127  BP: (!) 127/92   Pulse: 99   Resp: 20   Temp: 97.9 F (36.6 C)   SpO2: 100%   Weight: 68 kg   Height: 5' 11.5 (1.816 m)   PainSc:  0-No pain   Body mass index is 20.63 kg/m.  GEN: Well nourished, well developed in no acute distress NECK: No JVD CARDIAC: Normal rate, irregular rhythm.  RESPIRATORY:  Clear to auscultation without rales, wheezing or rhonchi  ABDOMEN: Soft, non-distended EXTREMITIES:  No edema; No deformity    ASSESSMENT AND PLAN:   Jacob Marsh is a 73 y.o. male with h/o atrial flutter s/p CTI ablation 2021, persistent atrial fibrillation, PVCs who is being seen today for EP follow up.   #. Frequent PVCs, symptomatic: RBBB, left superior axis. Possibly posteromedial papillary muscle. This is likely the primary driver of his symptoms at this point.  - Last EF 50-55%. - Discussed treatment options today for PVCs including changing antiarrhythmic drug therapy to sotalol +/- mexiletine and ablation. Discussed risks, recovery and likelihood of success with each treatment strategy. Risk, benefits, and alternatives to EP study and ablation for PVCs were discussed. These risks include but are not limited to stroke, bleeding, vascular damage, tamponade, perforation, damage to the esophagus, lungs, phrenic nerve and other structures, worsening renal function, coronary vasospasm and death.  Discussed potential need for repeat ablation procedures and antiarrhythmic drugs after an initial ablation. The patient understands these risk and wishes to proceed.  We will therefore proceed with catheter ablation today.     Signed, Fonda Kitty, MD

## 2024-07-03 NOTE — Discharge Instructions (Signed)
 Jacob Marsh

## 2024-07-03 NOTE — H&P (Signed)
 Electrophysiology Note:   Date:  07/03/24  ID:  Jacob Marsh, DOB [birth year], MRN [id number]   Primary Cardiologist: Danelle Birmingham, MD Electrophysiologist: Danelle Birmingham, MD       History of Present Illness:     CC: Jacob Marsh is a 73 y.o. male with h/o atrial flutter s/p ablation 2021, persistent atrial fibrillation, PVCs who is being seen today for EP follow up.   Patient presented to ED on 2/10 with a chief complaint of dizziness. He was found to be in ventricular bigeminy at the time. He also felt like he may have been having increased burden of AF leading up to hospital visit. Patient did not want to make any changes to regimen at that time. He was given a cardiac monitor to assess burden of AF and PVCs with plans to follow up in clinic.   Discussed the use of AI scribe software for clinical note transcription with the patient, who gave verbal consent to proceed.   History of Present Illness   The patient, with a known history of atrial fibrillation and premature ventricular contractions (PVCs), presents with progressively worsening symptoms over the past few months, which corresponds with an increase in PVC burden. The patient describes feeling lightheaded, experiencing palpitations, and having chest pressure. These symptoms have been severe enough to cause concern and affect his daily activities, including his ability to walk his dog for three miles daily. The patient reports that the symptoms come and go. However, recently, the symptoms have been more noticeable and bothersome. The patient has been wearing a heart monitor for about three weeks to help determine the cause of his symptoms. The patient is currently on flecainide  for his heart conditions, but the medication does not seem to be significantly improving his symptoms. No acute complaints today.     Interval: Presents today for scheduled ablation. Reports feeling relatively well. Continues to have palpitations. No new or  acute complaints.   Review of systems complete and found to be negative unless listed in HPI.    EP Information / Studies Personally Reviewed:      EKG Interpretation Date/Time:                  Tuesday January 04 2024 10:15:51 EDT Ventricular Rate:         66 PR Interval:                 220 QRS Duration:             114 QT Interval:                 460 QTC Calculation:482 R Axis:                         -68   Text Interpretation:Sinus bradycardia with 1st degree A-V block with frequent Premature ventricular complexes Incomplete right bundle branch block Left anterior fascicular block When compared with ECG of 06-Dec-2023 15:07, No significant change was found Confirmed by Kennyth Chew [phone]) on 01/04/2024 10:23:42 AM      EKG 12/06/23: Sinus with PVCs.      Echo 12/07/23:   1. Very difficult study with poor windows for TTE. LVEF appears low  normal 50-55%.   2. Left ventricular ejection fraction, by estimation, is 50 to 55%. The  left ventricle has low normal function. The left ventricle demonstrates  global hypokinesis. Left ventricular diastolic function could not be  evaluated.  3. Right ventricular systolic function is normal. The right ventricular  size is normal. Tricuspid regurgitation signal is inadequate for assessing  PA pressure.   4. The mitral valve is grossly normal. Mild mitral valve regurgitation.  No evidence of mitral stenosis.   5. The aortic valve is grossly normal. Aortic valve regurgitation is not  visualized. No aortic stenosis is present.   6. The inferior vena cava is dilated in size with >50% respiratory  variability, suggesting right atrial pressure of 8 mmHg.    Risk Assessment/Calculations:     CHA2DS2-VASc Score = 1   This indicates a 0.6% annual risk of stroke. [name] score is based upon: CHF History: 0 HTN History: 0 Diabetes History: 0 Stroke History: 0 Vascular Disease History: 0 Age Score: 1 Gender Score: 0            Physical Exam:    Today's Vitals   07/03/24 1058 07/03/24 1127  BP: (!) 127/92   Pulse: 99   Resp: 20   Temp: 97.9 F (36.6 C)   SpO2: 100%   Weight: 68 kg   Height: 5' 11.5 (1.816 m)   PainSc:  0-No pain   Body mass index is 20.63 kg/m.  GEN: Well nourished, well developed in no acute distress NECK: No JVD CARDIAC: Normal rate, irregular rhythm.  RESPIRATORY:  Clear to auscultation without rales, wheezing or rhonchi  ABDOMEN: Soft, non-distended EXTREMITIES:  No edema; No deformity    ASSESSMENT AND PLAN:   Jacob Marsh is a 73 y.o. male with h/o atrial flutter s/p CTI ablation 2021, persistent atrial fibrillation, PVCs who is being seen today for EP follow up.   #. Frequent PVCs, symptomatic: RBBB, left superior axis. Possibly posteromedial papillary muscle. This is likely the primary driver of his symptoms at this point.  - Last EF 50-55%. - Discussed treatment options today for PVCs including changing antiarrhythmic drug therapy to sotalol +/- mexiletine and ablation. Discussed risks, recovery and likelihood of success with each treatment strategy. Risk, benefits, and alternatives to EP study and ablation for PVCs were discussed. These risks include but are not limited to stroke, bleeding, vascular damage, tamponade, perforation, damage to the esophagus, lungs, phrenic nerve and other structures, worsening renal function, coronary vasospasm and death.  Discussed potential need for repeat ablation procedures and antiarrhythmic drugs after an initial ablation. The patient understands these risk and wishes to proceed.  We will therefore proceed with catheter ablation today.     Signed, Fonda Kitty, MD

## 2024-07-04 ENCOUNTER — Encounter (HOSPITAL_COMMUNITY): Payer: Self-pay | Admitting: Cardiology

## 2024-07-04 ENCOUNTER — Telehealth: Payer: Self-pay

## 2024-07-04 MED FILL — Heparin Sodium (Porcine) Inj 1000 Unit/ML: INTRAMUSCULAR | Qty: 10 | Status: AC

## 2024-07-04 NOTE — Telephone Encounter (Signed)
 Left message for patient to call back

## 2024-07-04 NOTE — Telephone Encounter (Signed)
-----   Message from Jacob Marsh sent at 07/03/2024 12:44 PM EDT ----- Regarding: Cardioversion and follow up Can we get this patient scheduled for cardioversion in 3-4 weeks then clinic visit with me 1 week after?  Josh

## 2024-07-04 NOTE — Telephone Encounter (Signed)
 Pt returning call to a nurse

## 2024-07-04 NOTE — Telephone Encounter (Signed)
 Spoke with the patient and scheduled him for a cardioversion on 9/30. Instructions will be sent through MyChart. Patient to call back with any questions.

## 2024-07-13 ENCOUNTER — Encounter: Payer: Self-pay | Admitting: Family Medicine

## 2024-07-13 NOTE — Telephone Encounter (Signed)
 He can get flu here or at pharmacy  Shingrix at pharmacy (no need for prescription)- I don't have a record of him having one As of now can get covid vaccine at pharmacy if over 65 w/o prescription    I would get flu and covid now Then shingrix in a month or so

## 2024-07-24 NOTE — Progress Notes (Signed)
 Spoke to patient and instructed them to come at [facility]  and to be NPO after 0000.     Confirmed that patient will have a ride home and someone to stay with them for 24 hours after the procedure.   Medications reviewed.  Confirmed blood thinner.  Confirmed no breaks in taking blood thinner for 3+ weeks prior to procedure.

## 2024-07-25 ENCOUNTER — Ambulatory Visit (HOSPITAL_COMMUNITY): Admitting: Anesthesiology

## 2024-07-25 ENCOUNTER — Ambulatory Visit (HOSPITAL_COMMUNITY)
Admission: RE | Admit: 2024-07-25 | Discharge: 2024-07-25 | Disposition: A | Attending: Cardiology | Admitting: Cardiology

## 2024-07-25 ENCOUNTER — Other Ambulatory Visit: Payer: Self-pay

## 2024-07-25 ENCOUNTER — Encounter (HOSPITAL_COMMUNITY): Payer: Self-pay | Admitting: Cardiology

## 2024-07-25 ENCOUNTER — Encounter (HOSPITAL_COMMUNITY)
Admission: RE | Disposition: A | Discharge status: Home / Self Care | Payer: Self-pay | Source: Home / Self Care | Attending: Cardiology

## 2024-07-25 DIAGNOSIS — I4819 Other persistent atrial fibrillation: Secondary | ICD-10-CM | POA: Diagnosis not present

## 2024-07-25 DIAGNOSIS — I493 Ventricular premature depolarization: Secondary | ICD-10-CM | POA: Insufficient documentation

## 2024-07-25 DIAGNOSIS — K219 Gastro-esophageal reflux disease without esophagitis: Secondary | ICD-10-CM | POA: Insufficient documentation

## 2024-07-25 DIAGNOSIS — N400 Enlarged prostate without lower urinary tract symptoms: Secondary | ICD-10-CM | POA: Insufficient documentation

## 2024-07-25 DIAGNOSIS — Z79899 Other long term (current) drug therapy: Secondary | ICD-10-CM | POA: Insufficient documentation

## 2024-07-25 DIAGNOSIS — F32A Depression, unspecified: Secondary | ICD-10-CM | POA: Diagnosis not present

## 2024-07-25 DIAGNOSIS — I4891 Unspecified atrial fibrillation: Secondary | ICD-10-CM | POA: Diagnosis not present

## 2024-07-25 DIAGNOSIS — Z01818 Encounter for other preprocedural examination: Secondary | ICD-10-CM

## 2024-07-25 HISTORY — PX: CARDIOVERSION: EP1203

## 2024-07-25 SURGERY — CARDIOVERSION (CATH LAB)
Anesthesia: General

## 2024-07-25 MED ORDER — SODIUM CHLORIDE 0.9 % IV SOLN: INTRAVENOUS | Status: DC

## 2024-07-25 MED ORDER — PROPOFOL 10 MG/ML IV BOLUS
INTRAVENOUS | Status: DC | PRN
  Administered 2024-07-25: 80 mg via INTRAVENOUS

## 2024-07-25 MED ORDER — LIDOCAINE 2% (20 MG/ML) 5 ML SYRINGE
INTRAMUSCULAR | Status: DC | PRN
  Administered 2024-07-25: 80 mg via INTRAVENOUS

## 2024-07-25 SURGICAL SUPPLY — 1 items: PAD DEFIB RADIO PHYSIO CONN (PAD) ×1 IMPLANT

## 2024-07-25 NOTE — Interval H&P Note (Signed)
 History and Physical Interval Note:  07/25/2024 7:57 AM  Jacob Marsh  has presented today for surgery, with the diagnosis of AFIB.  The various methods of treatment have been discussed with the patient and family. After consideration of risks, benefits and other options for treatment, the patient has consented to  Procedure(s): CARDIOVERSION (N/A) as a surgical intervention.  [name] history has been reviewed, patient examined, no change in status, stable for surgery.  I have reviewed the patient's chart and labs.  Questions were answered to the patient's satisfaction.     Mirielle Byrum Lonni

## 2024-07-25 NOTE — Anesthesia Preprocedure Evaluation (Addendum)
 Anesthesia Evaluation  Patient identified by MRN, date of birth, ID band Patient awake    Reviewed: Allergy & Precautions, NPO status , Patient's Chart, lab work & pertinent test results, reviewed documented beta blocker date and time   Airway Mallampati: I  TM Distance: >3 FB Neck ROM: Full    Dental no notable dental hx. 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  Hx/o fPVC ablation 2020   Neuro/Psych  PSYCHIATRIC DISORDERS  Depression    negative neurological ROS     GI/Hepatic Neg liver ROS,GERD  Medicated,,  Endo/Other  negative endocrine ROS    Renal/GU negative Renal ROS   BPH    Musculoskeletal negative musculoskeletal ROS (+)    Abdominal   Peds  Hematology negative hematology ROS (+) Hx/o Thrombocytopenia   Anesthesia Other Findings   Reproductive/Obstetrics                              Anesthesia Physical Anesthesia Plan  ASA: 3  Anesthesia Plan: General   Post-op Pain Management: Minimal or no pain anticipated   Induction: Intravenous  PONV Risk Score and Plan: 2 and Treatment may vary due to age or medical condition and Propofol infusion  Airway Management Planned: Natural Airway and Nasal Cannula  Additional Equipment: None  Intra-op Plan:   Post-operative Plan:   Informed Consent: I have reviewed the patients History and Physical, chart, labs and discussed the procedure including the risks, benefits and alternatives for the proposed anesthesia with the patient or authorized representative who has indicated his/her understanding and acceptance.     Dental advisory given  Plan Discussed with: CRNA and  Anesthesiologist  Anesthesia Plan Comments:          Anesthesia Quick Evaluation

## 2024-07-25 NOTE — Anesthesia Procedure Notes (Signed)
 Procedure Name: MAC Date/Time: 07/25/2024 8:25 AM  Performed by: Viviana Almarie DASEN, CRNAPre-anesthesia Checklist: Patient identified, Suction available, Patient being monitored, Emergency Drugs available and Timeout performed Patient Re-evaluated:Patient Re-evaluated prior to induction Oxygen Delivery Method: Nasal cannula Induction Type: IV induction

## 2024-07-25 NOTE — Transfer of Care (Signed)
 Immediate Anesthesia Transfer of Care Note  Patient: Jacob Marsh  Procedure(s) Performed: CARDIOVERSION  Patient Location: PACU and Cath Lab  Anesthesia Type:MAC  Level of Consciousness: awake, alert , oriented, and patient cooperative  Airway & Oxygen Therapy: Patient Spontanous Breathing and Patient connected to nasal cannula oxygen  Post-op Assessment: Report given to RN, Post -op Vital signs reviewed and stable, Patient moving all extremities X 4, and Patient able to stick tongue midline  Post vital signs: Reviewed and stable  Last Vitals:  Vitals Value Taken Time  BP 114/68 0838  Temp 36.5   Pulse 49   Resp 12   SpO2 97     Last Pain:  Vitals:   07/25/24 0739  TempSrc:   PainSc: 0-No pain         Complications: No notable events documented.

## 2024-07-25 NOTE — Anesthesia Postprocedure Evaluation (Signed)
 Anesthesia Post Note  Patient: Jacob Marsh  Procedure(s) Performed: CARDIOVERSION     Patient location during evaluation: PACU Anesthesia Type: General Level of consciousness: awake and alert Pain management: pain level controlled Vital Signs Assessment: post-procedure vital signs reviewed and stable Respiratory status: spontaneous breathing, nonlabored ventilation and respiratory function stable Cardiovascular status: blood pressure returned to baseline and stable Postop Assessment: no apparent nausea or vomiting Anesthetic complications: no   No notable events documented.  Last Vitals:  Vitals:   07/25/24 0920 07/25/24 0925  BP: (!) 102/53 (!) 102/59  Pulse: (!) 49 (!) 51  Resp: (!) 25 12  Temp:    SpO2: 100% 98%    Last Pain:  Vitals:   07/25/24 0840  TempSrc: Temporal  PainSc:                  Butler Levander Pinal

## 2024-07-25 NOTE — CV Procedure (Signed)
 Procedure:   DCCV  Indication:  Symptomatic atrial fibrillation  Procedure Note:  The patient signed informed consent.  They have had had therapeutic anticoagulation with a[ixaban greater than 3 weeks.  Anesthesia was administered by Dr. Cleotilde.  Patient received 80 mg IV lidocaine  and 80 mg IV propofol.Adequate airway was maintained throughout and vital followed per protocol.  They were cardioverted x 3 with 200, 300, 360J of biphasic synchronized energy with anterior pressure.  After the first shock, he had one beat of sinus and went back to afib. After the second shock, he had a 2.1 second pause and was in sinus bradycardia for over a minute, and he then went into flutter rhythm. After the third shock, he had a 4.6 second pause and stayed in sinus bradycardia.  There were no apparent complications.  The patient had normal neuro status and respiratory status post procedure with vitals stable as recorded elsewhere.    Follow up:  They will continue on current medical therapy and follow up with cardiology as scheduled.  Shelda Bruckner, MD PhD 07/25/2024 8:40 AM

## 2024-07-27 DIAGNOSIS — Z23 Encounter for immunization: Secondary | ICD-10-CM | POA: Diagnosis not present

## 2024-08-01 NOTE — Progress Notes (Unsigned)
 Electrophysiology Office Note:   Date:  08/02/2024  ID:  Jacob Marsh, DOB 1951/01/01, MRN [id number]  Primary Cardiologist: None Primary Heart Failure: None Electrophysiologist: Danelle Birmingham, MD / Dr. Kennyth      History of Present Illness:   Jacob Marsh is a 73 y.o. male with h/o PVC's, AF,  seen today for routine electrophysiology follow-up s/p EPS for PVC's.  The patient underwent an EPS for PVC ablation.  His PVC burden during the study was low and different morphology. EPS was aborted. He presented to the procedure in AF and was started on Eliquis  and planned for DCCV. He underwent DCCV on 07/25/24 with 3 shocks > ultimately had brief post conversion pause after 3rd shock and then SB.   Since last being seen in our clinic the patient reports doing better. He states he feels better in normal rhythm. He thinks he has had a couple of episodes of AF since his cardioversion but is not certain.  He continues to have palpitations occasionally.  He has been taking metoprolol  12mg  BID and an additional 3rd dose during the middle of the day as he feels like it wears off. Has an Scientist, physiological.    He denies chest pain, palpitations, dyspnea, PND, orthopnea, nausea, vomiting, dizziness, syncope, edema, weight gain, or early satiety.    Review of systems complete and found to be negative unless listed in HPI.   EP Information / Studies Reviewed:    EKG is ordered today. Personal review as below.  EKG Interpretation Date/Time:  Wednesday August 02 2024 13:56:20 EDT Ventricular Rate:  59 PR Interval:  190 QRS Duration:  116 QT Interval:  438 QTC Calculation: 433 R Axis:   8  Text Interpretation: Sinus bradycardia Right bundle branch block Confirmed by Aniceto Jarvis ([id number]) on 08/02/2024 2:04:36 PM    Arrhythmia / AAD / Pertinent EP Studies AF  PVC's   Flecainide  > did not suppress PVC's, baseline conduction disease  EPS 07/03/24 > low PVC burden 1-2%, AF on presentation DCCV 07/25/24  > 200j, 300, 360j biphasic > after 3rd shock he had a 4.6 second pause and then stayed in SB   Risk Assessment/Calculations:    CHA2DS2-VASc Score = 1   This indicates a 0.6% annual risk of stroke. [name] score is based upon: CHF History: 0 HTN History: 0 Diabetes History: 0 Stroke History: 0 Vascular Disease History: 0 Age Score: 1 Gender Score: 0              Physical Exam:   VS:  BP 130/60   Pulse (!) 59   Ht 5' 11 (1.803 m)   Wt 157 lb (71.2 kg)   SpO2 98%   BMI 21.90 kg/m    Wt Readings from Last 3 Encounters:  08/02/24 157 lb (71.2 kg)  07/25/24 150 lb (68 kg)  07/03/24 150 lb (68 kg)     GEN: Well nourished, well developed in no acute distress NECK: No JVD; No carotid bruits CARDIAC: Regular rate and rhythm, no murmurs, rubs, gallops RESPIRATORY:  Clear to auscultation without rales, wheezing or rhonchi  ABDOMEN: Soft, non-tender, non-distended EXTREMITIES:  No edema; No deformity   ASSESSMENT AND PLAN:    Persistent Atrial Fibrillation  Atrial Flutter s/p CTI Ablation 2021  RBBB CHA2DS2-VASc 1, s/p DCCV on 9/30 -change to Toprol  XL 25 mg daily  -ok to continue metoprolol  12.5mg  PRN sustained HR >110bpm -EKG with SB, RBBB   -no significant symptom burden post DCCV   -  given baseline conduction disease, hold further flecainide . Discussed options of rhythm control with patient > ablation vs medications (tikosyn, amio). He is pending a move to [place], [place] in December.  -discussed getting a [employer] or 4th generation Apple Watch   PVC's  RBBB, L superior axis. LVEF 50-55% -aborted EP study, low PVC burden with different morphology   Secondary Hypercoagulable State  -continue Eliquis  5mg  BID, dose reviewed and appropriate by age/wt     Follow up with Dr. Kennyth / EP APP in 4 weeks to reassess HR / palpitations  Signed, Daphne Barrack, NP-C, AGACNP-BC [facility] - Electrophysiology  08/02/2024, 2:05 PM

## 2024-08-02 ENCOUNTER — Ambulatory Visit: Attending: Cardiology | Admitting: Pulmonary Disease

## 2024-08-02 ENCOUNTER — Encounter: Payer: Self-pay | Admitting: Pulmonary Disease

## 2024-08-02 VITALS — BP 130/60 | HR 59 | Ht 71.0 in | Wt 157.0 lb

## 2024-08-02 DIAGNOSIS — I444 Left anterior fascicular block: Secondary | ICD-10-CM | POA: Insufficient documentation

## 2024-08-02 DIAGNOSIS — I483 Typical atrial flutter: Secondary | ICD-10-CM | POA: Diagnosis not present

## 2024-08-02 DIAGNOSIS — I493 Ventricular premature depolarization: Secondary | ICD-10-CM | POA: Diagnosis not present

## 2024-08-02 DIAGNOSIS — D6869 Other thrombophilia: Secondary | ICD-10-CM | POA: Diagnosis not present

## 2024-08-02 DIAGNOSIS — I4819 Other persistent atrial fibrillation: Secondary | ICD-10-CM | POA: Diagnosis not present

## 2024-08-02 DIAGNOSIS — I4510 Unspecified right bundle-branch block: Secondary | ICD-10-CM | POA: Diagnosis not present

## 2024-08-02 MED ORDER — METOPROLOL TARTRATE 25 MG PO TABS: 12.5000 mg | ORAL_TABLET | Freq: Every day | ORAL | 0 refills | Status: AC | PRN

## 2024-08-02 MED ORDER — METOPROLOL SUCCINATE ER 25 MG PO TB24: 25.0000 mg | ORAL_TABLET | Freq: Every day | ORAL | 1 refills | Status: AC

## 2024-08-02 NOTE — Patient Instructions (Addendum)
 Medication Instructions:  Begin Toprol  XL 25 mg daily Think of this is your maintenance medication (slow release form).  Keep your current Metoprolol  Tartrate 12.5 mg and this will be used if you have sustained palpitations with elevated HR>110.  This is only as needed / not scheduled.   *If you need a refill on your cardiac medications before your next appointment, please call your pharmacy*  Lab Work: No lab work today If you have labs (blood work) drawn today and your tests are completely normal, you will receive your results only by: MyChart Message (if you have MyChart) OR A paper copy in the mail If you have any lab test that is abnormal or we need to change your treatment, we will call you to review the results.  Testing/Procedures: No testing/procedures were scheduled today  Follow-Up: At [facility], you and your health needs are our priority.  As part of our continuing mission to provide you with exceptional heart care, our providers are all part of one team.  This team includes your primary Cardiologist (physician) and Advanced Practice Providers or APPs (Physician Assistants and Nurse Practitioners) who all work together to provide you with the care you need, when you need it.  Your next appointment:   1 month(s)  Provider:   You may see Danelle Birmingham, MD or one of the following Advanced Practice Providers on your designated Care Team:    Daphne Barrack, NP    We recommend signing up for the patient portal called MyChart.  Sign up information is provided on this After Visit Summary.  MyChart is used to connect with patients for Virtual Visits (Telemedicine).  Patients are able to view lab/test results, encounter notes, upcoming appointments, etc.  Non-urgent messages can be sent to your provider as well.   To learn more about what you can do with MyChart, go to [email or web address].     Other:  Consider a [employer] (single lead) - either the stick form or  credit card form  OR Apple Watch - series 4 or newer to allow for EKG

## 2024-08-28 NOTE — Progress Notes (Deleted)
  Electrophysiology Office Note:   Date:  08/28/2024  ID:  Jacob Marsh, DOB 1951/08/18, MRN [id number]  Primary Cardiologist: None Primary Heart Failure: None Electrophysiologist: Danelle Birmingham, MD  {Click to update primary MD,subspecialty MD or APP then REFRESH:1}    History of Present Illness:   Jacob Marsh is a 73 y.o. male with h/o PVC's, AF seen today for routine electrophysiology followup.   Since last being seen in our clinic the patient reports doing ***.    He *** denies chest pain, palpitations, dyspnea, PND, orthopnea, nausea, vomiting, dizziness, syncope, edema, weight gain, or early satiety.   Review of systems complete and found to be negative unless listed in HPI.   EP Information / Studies Reviewed:    EKG is not ordered today. EKG from 08/02/24  reviewed which showed SB 59 bpm, RBBB      Arrhythmia / AAD / Pertinent EP Studies AF  PVC's   Flecainide  > did not suppress PVC's, baseline conduction disease  EPS 07/03/24 > low PVC burden 1-2%, AF on presentation DCCV 07/25/24 > 200j, 300, 360j biphasic > after 3rd shock he had a 4.6 second pause and then stayed in SB  Risk Assessment/Calculations:    CHA2DS2-VASc Score = 1  {Confirm score is correct.  If not, click here to update score.  REFRESH note.  :1} This indicates a 0.6% annual risk of stroke. [name] score is based upon: CHF History: 0 HTN History: 0 Diabetes History: 0 Stroke History: 0 Vascular Disease History: 0 Age Score: 1 Gender Score: 0     No BP recorded.  {Refresh Note OR Click here to enter BP  :1}***        Physical Exam:   VS:  There were no vitals taken for this visit.   Wt Readings from Last 3 Encounters:  08/02/24 157 lb (71.2 kg)  07/25/24 150 lb (68 kg)  07/03/24 150 lb (68 kg)     GEN: Well nourished, well developed in no acute distress NECK: No JVD; No carotid bruits CARDIAC: {EPRHYTHM:28826}, no murmurs, rubs, gallops RESPIRATORY:  Clear to auscultation without  rales, wheezing or rhonchi  ABDOMEN: Soft, non-tender, non-distended EXTREMITIES:  No edema; No deformity   ASSESSMENT AND PLAN:    Persistent Atrial Fibrillation  Atrial Flutter s/p CTI Ablation 2021  RBBB CHA2DS2-VASc 1, s/p DCCV on 9/30 -Toprol  25 mg daily  -metoprolol  12.5mg  PRN sustained HR > 110 bpm  -baseline conduction system disease > previously reviewed rhythm control options with patient to include ablation vs medication (tikosyn, amiodarone) -encouraged wearable monitoring technology   PVC's  RBBB, L superior axis. LVEF 50-55%, aborted EP study, low PVC burden with different morphology  Secondary Hypercoagulable State  -continue Eliquis  5mg  BID, dose reviewed and appropriate by age / wt    Follow up with Dr. Kennyth {EPFOLLOW LE:71826}.  Pt pending move to [place], [place].   Signed, Daphne Barrack, NP-C, AGACNP-BC [facility] - Electrophysiology  08/28/2024, 7:56 AM

## 2024-08-29 ENCOUNTER — Ambulatory Visit: Admitting: Pulmonary Disease

## 2024-09-05 ENCOUNTER — Ambulatory Visit: Attending: Student | Admitting: Student

## 2024-09-05 ENCOUNTER — Encounter: Payer: Self-pay | Admitting: Student

## 2024-09-05 VITALS — BP 120/60 | HR 51 | Ht 71.0 in | Wt 154.0 lb

## 2024-09-05 DIAGNOSIS — I4819 Other persistent atrial fibrillation: Secondary | ICD-10-CM | POA: Insufficient documentation

## 2024-09-05 DIAGNOSIS — I493 Ventricular premature depolarization: Secondary | ICD-10-CM | POA: Diagnosis not present

## 2024-09-05 DIAGNOSIS — I483 Typical atrial flutter: Secondary | ICD-10-CM | POA: Insufficient documentation

## 2024-09-05 DIAGNOSIS — I4510 Unspecified right bundle-branch block: Secondary | ICD-10-CM | POA: Insufficient documentation

## 2024-09-05 NOTE — Progress Notes (Signed)
  Electrophysiology Office Note:   Date:  09/05/2024  ID:  Jacob Marsh, DOB 1951/09/12, MRN [id number]  Primary Cardiologist: None Primary Heart Failure: None Electrophysiologist: Danelle Birmingham, MD      History of Present Illness:   Jacob Marsh is a 73 y.o. male with h/o PVC's, AF seen today for routine electrophysiology followup.   Since last being seen in our clinic the patient reports doing very well. Rare palpitations. Mild dyspnea with inclines. Otherwise, no syncope or edema. Taking all medications as directed.   Moving to [place] next month. Daughter is an anaesthesiologist and has recommendations for cardiologist for him.   Review of systems complete and found to be negative unless listed in HPI.   EP Information / Studies Reviewed:    EKG is not ordered today. EKG from 08/02/24  reviewed which showed SB 59 bpm, RBBB  EKG Interpretation Date/Time:  Tuesday September 05 2024 12:03:18 EST Ventricular Rate:  51 PR Interval:  198 QRS Duration:  114 QT Interval:  450 QTC Calculation: 414 R Axis:   -26  Text Interpretation: Sinus bradycardia with occasional Premature ventricular complexes Incomplete right bundle branch block When compared with ECG of 02-Aug-2024 13:56, Premature ventricular complexes are now Present Confirmed by Marsh Sharper [phone]) on 09/05/2024 12:06:08 PM   Arrhythmia / AAD / Pertinent EP Studies AF  PVC's   Flecainide  > did not suppress PVC's, baseline conduction disease  EPS 07/03/24 > low PVC burden 1-2%, AF on presentation DCCV 07/25/24 > 200j, 300, 360j biphasic > after 3rd shock he had a 4.6 second pause and then stayed in SB         Physical Exam:   VS:  BP 120/60   Pulse (!) 51   Ht 5' 11 (1.803 m)   Wt 154 lb (69.9 kg)   SpO2 97%   BMI 21.48 kg/m    Wt Readings from Last 3 Encounters:  09/05/24 154 lb (69.9 kg)  08/02/24 157 lb (71.2 kg)  07/25/24 150 lb (68 kg)     GEN: Well nourished, well developed in no acute distress NECK:  No JVD; No carotid bruits CARDIAC: Regular rate and rhythm with occasional ectopy, no murmurs, rubs, gallops RESPIRATORY:  Clear to auscultation without rales, wheezing or rhonchi  ABDOMEN: Soft, non-tender, non-distended EXTREMITIES:  No edema; No deformity   ASSESSMENT AND PLAN:    Persistent Atrial Fibrillation  Atrial Flutter s/p CTI Ablation 2021  RBBB CHA2DS2-VASc 2, s/p DCCV on 9/30 EKG today shows NSR with a PVC Continue Toprol  25 mg daily  Continue metoprolol  12.5mg  PRN sustained HR > 110 bpm  -baseline conduction system disease > previously reviewed rhythm control options with patient to include ablation vs medication (tikosyn, amiodarone) Encouraged wearable monitoring technology   PVC's  RBBB, L superior axis. LVEF 50-55%, aborted EP study, low PVC burden with different morphology  Secondary hypercoagulable state Pt on Eliquis  as above    Follow up with Dr. Kennyth as needed.  Pt pending move to [place], [place].   Jacob Sharper Jodie Lesia, PA-C  09/05/2024 12:15 PM

## 2024-09-05 NOTE — Patient Instructions (Signed)
 Medication Instructions:  No medication changes today. *If you need a refill on your cardiac medications before your next appointment, please call your pharmacy*  Lab Work: No labwork ordered today. If you have labs (blood work) drawn today and your tests are completely normal, you will receive your results only by: MyChart Message (if you have MyChart) OR A paper copy in the mail If you have any lab test that is abnormal or we need to change your treatment, we will call you to review the results.  Testing/Procedures: No testing ordered today  Follow-Up: At [facility], you and your health needs are our priority.  As part of our continuing mission to provide you with exceptional heart care, our providers are all part of one team.  This team includes your primary Cardiologist (physician) and Advanced Practice Providers or APPs (Physician Assistants and Nurse Practitioners) who all work together to provide you with the care you need, when you need it.  Your next appointment:   AS NEEDED   Provider:   You may see Danelle Birmingham, MD or one of the following Advanced Practice Providers on your designated Care Team:   Charlies Arthur, PA-C Kasumi Ditullio Andy Kahlin Mark, PA-C Suzann Riddle, NP Daphne Barrack, NP    We recommend signing up for the patient portal called MyChart.  Sign up information is provided on this After Visit Summary.  MyChart is used to connect with patients for Virtual Visits (Telemedicine).  Patients are able to view lab/test results, encounter notes, upcoming appointments, etc.  Non-urgent messages can be sent to your provider as well.   To learn more about what you can do with MyChart, go to [email or web address].

## 2024-09-11 DIAGNOSIS — R233 Spontaneous ecchymoses: Secondary | ICD-10-CM | POA: Diagnosis not present

## 2024-09-11 DIAGNOSIS — L570 Actinic keratosis: Secondary | ICD-10-CM | POA: Diagnosis not present

## 2024-09-11 DIAGNOSIS — D225 Melanocytic nevi of trunk: Secondary | ICD-10-CM | POA: Diagnosis not present

## 2024-09-11 DIAGNOSIS — L821 Other seborrheic keratosis: Secondary | ICD-10-CM | POA: Diagnosis not present

## 2024-09-11 DIAGNOSIS — L814 Other melanin hyperpigmentation: Secondary | ICD-10-CM | POA: Diagnosis not present

## 2024-09-20 ENCOUNTER — Encounter: Payer: Self-pay | Admitting: Cardiology

## 2024-09-25 MED ORDER — APIXABAN 5 MG PO TABS: 5.0000 mg | ORAL_TABLET | Freq: Two times a day (BID) | ORAL | 0 refills | Status: AC

## 2025-01-19 ENCOUNTER — Ambulatory Visit
